# Patient Record
Sex: Female | Born: 1975 | Race: White | Hispanic: No | Smoking: Never smoker
Health system: Southern US, Community
[De-identification: ages and names within clinical notes are randomized; demographics above are authoritative.]

## PROBLEM LIST (undated history)

## (undated) DIAGNOSIS — F32A Depression, unspecified: Secondary | ICD-10-CM

## (undated) DIAGNOSIS — F329 Major depressive disorder, single episode, unspecified: Secondary | ICD-10-CM

## (undated) DIAGNOSIS — F314 Bipolar disorder, current episode depressed, severe, without psychotic features: Secondary | ICD-10-CM

## (undated) DIAGNOSIS — F319 Bipolar disorder, unspecified: Secondary | ICD-10-CM

## (undated) DIAGNOSIS — F419 Anxiety disorder, unspecified: Secondary | ICD-10-CM

## (undated) HISTORY — DX: Bipolar disorder, unspecified: F31.9

## (undated) HISTORY — PX: DILATION AND CURETTAGE OF UTERUS: SHX78

## (undated) HISTORY — PX: BREAST BIOPSY: SHX20

---

## 2002-02-11 ENCOUNTER — Other Ambulatory Visit: Admission: RE | Admit: 2002-02-11 | Discharge: 2002-02-11 | Payer: Self-pay | Admitting: *Deleted

## 2002-06-14 ENCOUNTER — Encounter: Payer: Self-pay | Admitting: *Deleted

## 2002-06-14 ENCOUNTER — Ambulatory Visit (HOSPITAL_COMMUNITY): Admission: RE | Admit: 2002-06-14 | Discharge: 2002-06-14 | Payer: Self-pay | Admitting: *Deleted

## 2002-06-25 ENCOUNTER — Inpatient Hospital Stay (HOSPITAL_COMMUNITY): Admission: AD | Admit: 2002-06-25 | Discharge: 2002-06-27 | Payer: Self-pay | Admitting: *Deleted

## 2002-09-03 ENCOUNTER — Encounter: Admission: RE | Admit: 2002-09-03 | Discharge: 2002-09-03 | Payer: Self-pay | Admitting: Family Medicine

## 2006-08-01 DIAGNOSIS — F314 Bipolar disorder, current episode depressed, severe, without psychotic features: Secondary | ICD-10-CM

## 2006-08-01 HISTORY — DX: Bipolar disorder, current episode depressed, severe, without psychotic features: F31.4

## 2007-04-08 ENCOUNTER — Emergency Department: Payer: Self-pay | Admitting: Emergency Medicine

## 2011-07-13 ENCOUNTER — Inpatient Hospital Stay: Payer: Self-pay | Admitting: Psychiatry

## 2015-09-09 ENCOUNTER — Other Ambulatory Visit: Payer: Self-pay | Admitting: Obstetrics and Gynecology

## 2015-09-09 DIAGNOSIS — Z1231 Encounter for screening mammogram for malignant neoplasm of breast: Secondary | ICD-10-CM

## 2015-09-22 ENCOUNTER — Ambulatory Visit
Admission: RE | Admit: 2015-09-22 | Discharge: 2015-09-22 | Disposition: A | Discharge status: Home / Self Care | Payer: No Typology Code available for payment source | Source: Ambulatory Visit | Attending: Obstetrics and Gynecology | Admitting: Obstetrics and Gynecology

## 2015-09-22 DIAGNOSIS — Z1231 Encounter for screening mammogram for malignant neoplasm of breast: Secondary | ICD-10-CM

## 2015-09-25 ENCOUNTER — Other Ambulatory Visit: Payer: Self-pay | Admitting: Obstetrics and Gynecology

## 2015-09-25 DIAGNOSIS — R928 Other abnormal and inconclusive findings on diagnostic imaging of breast: Secondary | ICD-10-CM

## 2015-10-08 ENCOUNTER — Encounter (HOSPITAL_COMMUNITY): Payer: Self-pay

## 2015-10-08 ENCOUNTER — Ambulatory Visit (HOSPITAL_COMMUNITY)
Admission: RE | Admit: 2015-10-08 | Discharge: 2015-10-08 | Disposition: A | Discharge status: Home / Self Care | Payer: No Typology Code available for payment source | Source: Ambulatory Visit | Attending: Obstetrics and Gynecology | Admitting: Obstetrics and Gynecology

## 2015-10-08 VITALS — BP 114/80 | Ht 69.0 in | Wt 343.0 lb

## 2015-10-08 DIAGNOSIS — Z1239 Encounter for other screening for malignant neoplasm of breast: Secondary | ICD-10-CM

## 2015-10-08 HISTORY — DX: Anxiety disorder, unspecified: F41.9

## 2015-10-08 HISTORY — DX: Depression, unspecified: F32.A

## 2015-10-08 HISTORY — DX: Bipolar disorder, current episode depressed, severe, without psychotic features: F31.4

## 2015-10-08 HISTORY — DX: Major depressive disorder, single episode, unspecified: F32.9

## 2015-10-08 NOTE — Patient Instructions (Signed)
Educational materials on self breast awareness given. Explained to Maria Jacobson that she did not need a Pap smear today due to last Pap smear was in February 2017 per patient. Let her know [facility] will cover Pap smears every 3 years unless has a history of abnormal Pap smears. Referred patient to the [facility] for right breast diagnostic mammogram per recommendation. Appointment scheduled for Friday, October 09, 2015 at 0950. Patient aware of appointment and will be there. Odette Frink verbalized understanding.  Jobeth Pangilinan, Arvil [place], RN 9:55 AM

## 2015-10-08 NOTE — Progress Notes (Signed)
Patient referred to [facility] by the [place] due to recommending additional imaging of the right breast. Screening mammogram completed 09/22/2015 at the [facility].  Pap Smear: Pap smear not completed today. Last Pap smear was in February 2017 at [facility] and normal per patient. Per patient has a history of an abnormal Pap smear when she was 40 years old that a follow up Pap smear was completed. Per patient all Pap smears have been normal since the one abnormal Pap smear over 20 years ago. No Pap smear results in EPIC.  Physical exam: Breasts Right breast larger than the left that per patient hasn't noticed any changes. Bilateral redness and yeast under bilateral lower breasts. No nipple retraction bilateral breasts. No nipple discharge bilateral breasts. No lymphadenopathy. No lumps palpated bilateral breasts. No complaints of pain or tenderness on exam. Referred patient to the [facility] for right breast diagnostic mammogram per recommendation. Appointment scheduled for Friday, October 09, 2015 at 0950.  Pelvic/Bimanual No Pap smear completed today since last Pap smear was in February 2017 per patient. Pap smear not indicated per BCCCP guidelines.   Smoking History: Patient has never smoked.  Patient Navigation: Patient education provided. Access to services provided for patient through [facility] program.

## 2015-10-09 ENCOUNTER — Ambulatory Visit
Admission: RE | Admit: 2015-10-09 | Discharge: 2015-10-09 | Disposition: A | Discharge status: Home / Self Care | Payer: No Typology Code available for payment source | Source: Ambulatory Visit | Attending: Obstetrics and Gynecology | Admitting: Obstetrics and Gynecology

## 2015-10-09 ENCOUNTER — Encounter (HOSPITAL_COMMUNITY): Payer: Self-pay | Admitting: *Deleted

## 2015-10-09 DIAGNOSIS — R928 Other abnormal and inconclusive findings on diagnostic imaging of breast: Secondary | ICD-10-CM

## 2015-11-12 ENCOUNTER — Ambulatory Visit (HOSPITAL_BASED_OUTPATIENT_CLINIC_OR_DEPARTMENT_OTHER): Payer: Self-pay

## 2015-11-12 ENCOUNTER — Other Ambulatory Visit: Payer: Self-pay

## 2015-11-12 VITALS — BP 113/71 | HR 78 | Temp 97.7°F | Resp 18 | Ht 68.0 in | Wt 341.2 lb

## 2015-11-12 DIAGNOSIS — Z0000 Encounter for general adult medical examination without abnormal findings: Secondary | ICD-10-CM

## 2015-11-12 LAB — LIPID PANEL
CHOL/HDL RATIO: 4.5 ratio — AB (ref 0.0–4.4)
Cholesterol, Total: 196 mg/dL (ref 100–199)
HDL: 44 mg/dL (ref >39)
LDL Calculated: 125 mg/dL — ABNORMAL HIGH (ref 0–99)
Triglycerides: 133 mg/dL (ref 0–149)
VLDL CHOLESTEROL CAL: 27 mg/dL (ref 5–40)

## 2015-11-12 LAB — GLUCOSE (CC13): GLUCOSE: 97 mg/dL (ref 70–140)

## 2015-11-12 NOTE — Progress Notes (Signed)
Patient is a new patient to the [facility] program and is currently a B[facility] patient effective 10/08/2015.   Clinical Measurements: Patient is 5 ft. 8 inches, weight 341.2 lbs, and BMI 52.   Medical History: Patient has no history of high cholesterol. Patient does not have a history of hypertension or diabetes. Per patient no diagnosed history of coronary heart disease, heart attack, heart failure, stroke/TIA, vascular disease or congenital heart defects. Patient does have a history of depression, panic attacks and bipolar affective disorder. She is on mulltiple medications which include: Adderall, Rexulti, klokopin, lamictal, seroquel, and Topamax.  Blood Pressure, Self-measurement: Patient states has no reason to check Blood pressure.  Nutrition Assessment: Patient stated that does not eat fruits every day. Patient states she eats 3 to 4 servings of vegetables a day. Per patient does not eat 3 or more ounces of whole grains daily. Patient doesn't eat two or more servings of fish weekly. Patient states she does not drink more than 36 ounces or 450 calories of beverages with added sugars weekly. Patient states she drinks a lot of water. Patient stated she does watch her salt intake.   Physical Activity Assessment: Patient stated she is not able to do much at this time so is not doing any moderate or vigorous activity.  Smoking Status: Patient stated that has never smoked and is not exposed to smoke.  Quality of Life Assessment: In assessing patient's physical quality of life she stated that out of the past 30 days that she has felt her health was not good for all days. Patient also stated that in the past 30 days that her mental health was not good including stress, depression and problems with emotions for all days. Patient did state that out of the past 30 days she felt her physical or mental health had kept her from doing her usual activities including self-care, work or recreation for 25 days.    Plan: Lab work will be done today including a lipid panel and Hgb A1C. Will call lab results when they are finished. Patient will receive Health Coaching for risk reduction initially and then as patient wants.

## 2015-11-12 NOTE — Patient Instructions (Signed)
Discussed health assessment with patient.She will be called with results of lab work and we will then discussed any further follow up the patient needs. Patient verbalized understanding.

## 2015-11-13 ENCOUNTER — Telehealth: Payer: Self-pay

## 2015-11-13 LAB — HEMOGLOBIN A1C
Est. average glucose Bld gHb Est-mCnc: 108 mg/dL
Hemoglobin A1c: 5.4 % (ref 4.8–5.6)

## 2015-11-13 NOTE — Telephone Encounter (Signed)
Called both telephone numbers and mail boxes were full. Will try again later

## 2015-11-13 NOTE — Telephone Encounter (Signed)
LAB RESULTS : Called to inform about lab work from 11/12/15. I informed patient: BMI 52, cholesterol- 196, HDL- 44, LDL- 125, triglycerides - 133, Bld Glucose -97 and HBG-A1C - 5.4.   RISK REDUCTION  HEALTH COACHING: Did risk reduction counseling concerning BMI. Informed that normal BMI was 18 to 25 and patient is in overweight range. Discussed coming in to develop plan to lower BMI. Discussed that needed to increase exercise and increase fiber to lower LDL. Fiber like wheat bread, fresh fruits and vegetables. Patient will call back to set up appointment for nutrition and exercise to obtain a health weight.

## 2015-12-02 ENCOUNTER — Telehealth: Payer: Self-pay

## 2015-12-02 NOTE — Telephone Encounter (Signed)
Called and no voice mail to leave message. Will call back for health coaching later.

## 2016-01-25 ENCOUNTER — Telehealth: Payer: Self-pay

## 2016-01-25 NOTE — Telephone Encounter (Signed)
Called for health coaching and was no answer and no answering machine.

## 2016-01-25 NOTE — Telephone Encounter (Signed)
Attempted to call patients significant other and voice mail was not set up.

## 2016-03-04 ENCOUNTER — Other Ambulatory Visit: Payer: Self-pay | Admitting: Internal Medicine

## 2016-03-04 ENCOUNTER — Other Ambulatory Visit: Payer: Self-pay | Admitting: Obstetrics and Gynecology

## 2016-03-04 DIAGNOSIS — N6310 Unspecified lump in the right breast, unspecified quadrant: Secondary | ICD-10-CM

## 2016-04-19 ENCOUNTER — Other Ambulatory Visit: Payer: No Typology Code available for payment source

## 2016-04-26 ENCOUNTER — Ambulatory Visit
Admission: RE | Admit: 2016-04-26 | Discharge: 2016-04-26 | Disposition: A | Discharge status: Home / Self Care | Payer: No Typology Code available for payment source | Source: Ambulatory Visit | Attending: Obstetrics and Gynecology | Admitting: Obstetrics and Gynecology

## 2016-04-26 ENCOUNTER — Other Ambulatory Visit: Payer: Self-pay | Admitting: Obstetrics and Gynecology

## 2016-04-26 DIAGNOSIS — N6310 Unspecified lump in the right breast, unspecified quadrant: Secondary | ICD-10-CM

## 2016-05-02 ENCOUNTER — Other Ambulatory Visit: Payer: Self-pay | Admitting: Obstetrics and Gynecology

## 2016-05-02 DIAGNOSIS — N6310 Unspecified lump in the right breast, unspecified quadrant: Secondary | ICD-10-CM

## 2016-05-03 ENCOUNTER — Ambulatory Visit
Admission: RE | Admit: 2016-05-03 | Discharge: 2016-05-03 | Disposition: A | Discharge status: Home / Self Care | Payer: No Typology Code available for payment source | Source: Ambulatory Visit | Attending: Obstetrics and Gynecology | Admitting: Obstetrics and Gynecology

## 2016-05-03 DIAGNOSIS — N6310 Unspecified lump in the right breast, unspecified quadrant: Secondary | ICD-10-CM

## 2016-05-23 ENCOUNTER — Encounter: Payer: Self-pay | Admitting: Family Medicine

## 2016-05-23 ENCOUNTER — Ambulatory Visit (INDEPENDENT_AMBULATORY_CARE_PROVIDER_SITE_OTHER): Payer: Self-pay | Admitting: Family Medicine

## 2016-05-23 VITALS — BP 115/84 | HR 98 | Temp 98.3°F | Resp 16 | Ht 69.0 in | Wt 334.0 lb

## 2016-05-23 DIAGNOSIS — F419 Anxiety disorder, unspecified: Secondary | ICD-10-CM

## 2016-05-23 DIAGNOSIS — Z6841 Body Mass Index (BMI) 40.0 and over, adult: Secondary | ICD-10-CM

## 2016-05-23 DIAGNOSIS — E782 Mixed hyperlipidemia: Secondary | ICD-10-CM

## 2016-05-23 DIAGNOSIS — E785 Hyperlipidemia, unspecified: Secondary | ICD-10-CM | POA: Insufficient documentation

## 2016-05-23 DIAGNOSIS — F319 Bipolar disorder, unspecified: Secondary | ICD-10-CM

## 2016-05-23 DIAGNOSIS — F314 Bipolar disorder, current episode depressed, severe, without psychotic features: Secondary | ICD-10-CM

## 2016-05-23 DIAGNOSIS — M25562 Pain in left knee: Secondary | ICD-10-CM

## 2016-05-23 DIAGNOSIS — Z7689 Persons encountering health services in other specified circumstances: Secondary | ICD-10-CM

## 2016-05-23 MED ORDER — METHYLPREDNISOLONE ACETATE 40 MG/ML IJ SUSP
40.0000 mg | Freq: Once | INTRAMUSCULAR | Status: AC
  Administered 2016-05-23: 40 mg via INTRA_ARTICULAR

## 2016-05-23 MED ORDER — LIDOCAINE HCL (PF) 1 % IJ SOLN
4.0000 mL | Freq: Once | INTRAMUSCULAR | Status: AC
  Administered 2016-05-23: 4 mL

## 2016-05-23 NOTE — Patient Instructions (Signed)
Thank you for coming in to clinic today.  1. Left knee steroid injection today, take it easy for 1-2 days, elevate, ice and wrap for compression, possible steroid flare with inc pain in 24-48 hours, then should improve - I am concerned for torn meniscus, may have other ligament injury but difficult to determine today  - Referral - wait 1-2 weeks if you have not heard give them a call to follow-up [facility] ([facility] [address], [place], [place] [address] Phone: [phone]  - Take Aleve OTC 223m take 2 pills twice a day with food, max dose. Stop Ibuprofen. -Recommend to start taking Tylenol Extra Strength 5056mtabs - take 1 to 2 tabs (max 100074mer dose) every 6 hours for pain (take regularly, don't skip a dose for next 3-7 days), max 24 hour daily dose is 6 to 8 tablets or 3000 to 4000m25mThis is safe to take with Ibuprofen or Aleve or similar medications  Future we will follow-up for annual physical for 2018 once you have your screening labs etc, bring copies of all records  Please schedule a follow-up appointment with Dr. KaraParks Ranger6 weeks for knee pain follow-up as needed, otherwise may follow-up with Ortho and return to our office in Feb or March 2018  If you have any other questions or concerns, please feel free to call the clinic or send a message through [facility] may also schedule an earlier appointment if necessary.  AlexNobie Putnam [place]

## 2016-05-23 NOTE — Progress Notes (Signed)
Subjective:    Patient ID: Maria Jacobson, female    DOB: 1975/12/11, 40 y.o.   MRN: [id number]  Maria Jacobson is a 40 y.o. female presenting on 05/23/2016 for Establish Care (main concerned L knee swollen and painful onset year but progressively getting worst)  [facility] Screening / Previously at [facility], last visit >1-2 years.  HPI   LEFT KNEE, CHRONIC PAIN: - Reports chronic problem with gradual worsening over past 1.5 years, describes a traumatic onset since 03/2015, acutely during activity, felt discomfort possibly a "pop", next 1-2 days significant worsening with swelling and acute pain, without bruising, was bedbound for period of time to help heal, had difficulty recovering. Initially went to Urgent Care initially, thought maybe Burisitis, has had in elbow, treated with prednisone x 2, with some side effects but no overall resolution. - Today describes constant left knee pain, severe up to 9/10 at worst, avg 8/10, describes sharp pains on medial left knee when ambulatory and weight bearing, when sitting and resting with some aching, and pressure on lateral aspect. Has instability symptoms when ambulating feels like it "goes backwards" and feels "weak due to pain", occasional mechanical symptoms some "locking". Limited range of motion worse with flexion. Associated swelling worse with activity, some improve with ice, compression sleeve and elevation. - Worse with prolonged standing, can stand only for few minutes, difficulty leaving bed only to go to bathroom, swelling, worsening ankle swelling. Worse with long car ride both of legs below knee swell - Recently saw chiropractor [place] therapy on knee with mild improvement - Taking Aleve 247m x 3 pills for BID and Ibuprofen 2067mx 4 tabs, no Tylenol, has been on OTC NSAIDs for a while - H/o BUs accident 1998 with chronic back pain, previously on vicodin for several years, has been off for about 8 years  Bipolar I / ADD /  Chronic Anxiety - [facility]- Today reports doing well on current medication regimen, no new concerns at this time - Medication Regimen:   - Taking Clonazepam 0.35m53mive times daily for up to 3 years, more recently Xanax 1mg19mghtly 4-5x nightly out of week (1-2 years)   - Taking Adderall 30mg46mly, well controlled, stable, since age 1   68Recently started Abilify IM inj (monthly), started few days ago, will taper off of Rexulti 3mg b38m1/2   - Taking Saphris 35mg, s91mingual 24 hr - for sleep, taking over 8 months   - Taking Lamictal 100mg BI18mr past 7 years, tolerated well without any symptoms of rash   - Seroquel 50mg BID61mr past 6 years - Reports she plans to file for disability for psychiatric diagnoses  HYPERLIPIDEMIA / MORBID OBESITY: - Last lipid panel done 10/2015 with mild abnormal LDL, otherwise normal results. She has never been treated with statin before. Admits to recent worsening weight gain due to chronic knee pain and inactivity. She gets cholesterol checked routinely through [facility] woman program, yearly will bring us resultKorea- Reports no significant abnormal glucose testing, does not recall prior A1c, does not have results from last screening - Previously was on a meal delivery restricted plan, [employer], did well with this, but had to stop 2 weeks ago due to cost, will try again in future, was able to lose up to 20 lbs over 3 months on the diet plan  Health Maintenance: - Follows with [facility]CCP free screening program for  mammograms and pap smears, History of R-breast complicated cyst recently, biopsy, negative - Has Medicaid Family Planning, follows at [facility] in [place] for routine pap smears, Denies abnormal pap smear, last February 2017, next due 2018. - Due for Flu (will go to pharmacy for discount self pay and bring [place] document) / Due for TDdap will hold for now   Past Medical History:    Diagnosis Date  . Anxiety    pt is seen by psych  . Bipolar 1 disorder ([place])   . Bipolar affective disorder, depressed, severe ([place])   . Depression    Social History   Social History  . Marital status: Legally Separated    Spouse name: N/A  . Number of children: N/A  . Years of education: N/A   Occupational History  . Not on file.   Social History Main Topics  . Smoking status: Never Smoker  . Smokeless tobacco: Never Used  . Alcohol use Yes     Comment: rarely  . Drug use: No  . Sexual activity: Yes    Birth control/ protection: IUD   Other Topics Concern  . Not on file   Social History Narrative  . No narrative on file   Family History  Problem Relation Age of Onset  . Thyroid disease Mother   . Rheum arthritis Mother   . Gout Mother   . Kidney disease Mother   . Bipolar disorder Father    Current Outpatient Prescriptions on File Prior to Visit  Medication Sig  . ALPRAZolam (XANAX) 1 MG tablet Take 1 mg by mouth at bedtime as needed for anxiety.  Marland Kitchen amphetamine-dextroamphetamine (ADDERALL) 30 MG tablet Take 30 mg by mouth daily.  . Brexpiprazole (REXULTI) 3 MG TABS Take by mouth daily.  . clonazePAM (KLONOPIN) 0.5 MG tablet Take 0.5 mg by mouth 5 (five) times daily.  Marland Kitchen lamoTRIgine (LAMICTAL) 100 MG tablet Take 100 mg by mouth 2 (two) times daily.  . QUEtiapine (SEROQUEL) 50 MG tablet Take 50 mg by mouth 2 (two) times daily.   No current facility-administered medications on file prior to visit.     Review of Systems  Constitutional: Negative for activity change, appetite change, chills, diaphoresis, fatigue and fever.  HENT: Negative for congestion, hearing loss and sinus pressure.   Eyes: Negative for visual disturbance.  Respiratory: Negative for cough, chest tightness, shortness of breath and wheezing.   Cardiovascular: Negative for chest pain, palpitations and leg swelling.  Gastrointestinal: Negative for abdominal pain, constipation, diarrhea, nausea  and vomiting.  Endocrine: Negative for cold intolerance, polydipsia and polyuria.  Genitourinary: Negative for dysuria, frequency and hematuria.  Musculoskeletal: Positive for arthralgias (left knee), back pain, gait problem and joint swelling. Negative for myalgias and neck pain.  Skin: Negative for rash.  Allergic/Immunologic: Negative for environmental allergies.  Neurological: Negative for dizziness, weakness, light-headedness, numbness and headaches.  Hematological: Negative for adenopathy.  Psychiatric/Behavioral: Positive for sleep disturbance (due to pain). Negative for agitation, behavioral problems, decreased concentration, dysphoric mood, self-injury and suicidal ideas. The patient is not nervous/anxious (controlled).    Per HPI unless specifically indicated above    Objective:    BP 115/84   Pulse 98   Temp 98.3 F (36.8 C) (Oral)   Resp 16   Ht 5' 9"  (1.753 m)   Wt (!) 334 lb (151.5 kg)   BMI 49.32 kg/m   Wt Readings from Last 3 Encounters:  05/23/16 (!) 334 lb (151.5 kg)  11/12/15 (!) 341 lb  3.2 oz (154.8 kg)  10/08/15 (!) 343 lb (155.6 kg)    Physical Exam  Constitutional: She appears well-developed and well-nourished. No distress.  Well-appearing, comfortable, cooperative, obese  HENT:  Head: Normocephalic and atraumatic.  Mouth/Throat: Oropharynx is clear and moist.  Eyes: Conjunctivae and EOM are normal. Pupils are equal, round, and reactive to light.  Neck: Normal range of motion. Neck supple. No thyromegaly present.  Cardiovascular: Normal rate, regular rhythm, normal heart sounds and intact distal pulses.   No murmur heard. Pulmonary/Chest: Effort normal and breath sounds normal. No respiratory distress. She has no wheezes. She has no rales.  Abdominal: Soft. Bowel sounds are normal. She exhibits no distension.  Musculoskeletal:  Left Knee Inspection: Bilateral significant soft tissue bulkiness bilaterally due to large body habitus. Left knee with  increased medial soft tissue swelling, without discrete knee joint effusion. No ecchymosis or erythema. Palpation: Generalized +TTP bilateral knees medial joint space, Left knee increased medial tenderness. L increased crepitus compared to R. ROM: Full active bilateral knee extension, left knee limited flexion due to body habitus and pain. Special Testing: Limited testing on L knee due to pain with ligamentous testing, grossly intact ACL with Lachman. Pain on MCL manipulation, however most consistent with Media Meniscus with positive Thessaly maneuver on left knee medial. McMurray not performed due to pain. Strength: 5/5 intact knee flex/ext, ankle dorsi/plantarflex Neurovascular: distally intact sensation light touch and pulses  Low Back Inspection: Large body habitus, no spinal deformity, symmetrical. Palpation: No tenderness over spinous processes. Mild bilateral lower lumbar paraspinal muscles tender and without hypertonicity/spasm. ROM: Some limited back extension and flexion. Special Testing: Seated SLR negative for radicular pain bilaterally Strength: Bilateral hip flex/ext 5/5, knee flex/ext 5/5, ankle dorsiflex/plantarflex 5/5 Neurovascular: intact distal sensation to light touch   Lymphadenopathy:    She has no cervical adenopathy.  Neurological: She is alert.  Skin: Skin is warm and dry. No rash noted. She is not diaphoretic.  Psychiatric: She has a normal mood and affect. Her behavior is normal.  Nursing note and vitals reviewed.   ________________________________________________________ PROCEDURE NOTE Date: 05/23/16 Left Knee Steroid injection Discussed benefits and risks (including pain, bleeding, infection, steroid flare). Verbal consent given by patient. Medication:  1 cc Depo-medrol 17m and 4 cc Lidocaine 1% without epi Time Out taken  Landmarks identified. Area cleansed with alcohol wipes.Using 21 gauge and 1, 1/2 inch needle, Left knee joint was injected (with above  listed medication) via medial approach. Cold spray used for superficial anesthetic.Sterile bandage placed.Patient tolerated procedure well without bleeding or paresthesias.No complications.  I have personally reviewed the following lab results from 11/12/15.  Results for orders placed or performed in visit on 11/12/15  Hemoglobin A1c  Result Value Ref Range   Hemoglobin A1c 5.4 4.8 - 5.6 %   Est. average glucose Bld gHb Est-mCnc 108 mg/dL  Lipid panel  Result Value Ref Range   Cholesterol, Total 196 100 - 199 mg/dL   Triglycerides 133 0 - 149 mg/dL   HDL 44 >39 mg/dL   VLDL Cholesterol Cal 27 5 - 40 mg/dL   LDL Calculated 125 (H) 0 - 99 mg/dL   Chol/HDL Ratio 4.5 (H) 0.0 - 4.4 ratio units  Glucose  Result Value Ref Range   Glucose 97 70 - 140 mg/dl      Assessment & Plan:   Problem List Items Addressed This Visit    Morbid obesity with BMI of 45.0-49.9, adult (HCC)    Worsening wt gain, chronic  problem. No significant fam history risk factors of DM, HTN. Improved wt loss on restricted meal diet plan, now worsening off of this due to cost. Limited exercise due to chronic left knee pain - Lifestyle modification counseling for wt loss today - Treat underlying L knee pain - Resume reduced portion dietary meal plan when able      Left medial knee pain    Worsening chronic Left medial and generalized knee pain with recurrent swelling, instability and mechanical symptoms. Concern for left medial meniscal injury, additionally may have other ligamentous injury MCL, but difficult exam today. Acute injury onset 03/2015 without adequate treatment, worsening pain since. Now limited function due to knee. Complicated by factor of morbid obesity. - No prior imaging, injections or other treatments  Plan: 1. Given acute pain, offered Left knee joint steroid injection today - see procedure note for details, tolerated well, ideally will provide mild-mod pain relief for few days to weeks until can  establish with ortho 2. Referral to [facility] for further evaluation, imaging, and management, will likely need x-rays (since patient is self pay, deferred these today), concern patient may need advanced imaging with MRI vs arthroscopy 3. Continue RICE therapy, avoid re-injury, may take Tylenol regularly, NSAID PRN 4. Follow-up as needed after ortho      Relevant Medications   lidocaine (PF) (XYLOCAINE) 1 % injection 4 mL (Completed)   methylPREDNISolone acetate (DEPO-MEDROL) injection 40 mg (Completed)   Other Relevant Orders   Ambulatory referral to Orthopedic Surgery   Hyperlipidemia    Last lipid 10/2015, only mild elevated LDL, otherwise stable. But weight gain. - Follow-up routine yearly fasting lipids through [facility] - No change today - Counseled on weight loss, treat left knee to improve mobility      Bipolar affective disorder, depressed, severe (HCC)    Stable, chronic problem Followed by Psychiatry Rondall Allegra Controlled on current med regimen per Psychiatry, no med management from our office on these and related problems. Patient considering filing for disability due to mental health      Bipolar 1 disorder ([place])   Anxiety    Stable, currently controlled on regimen per Psychiatry       Other Visit Diagnoses    Encounter to establish care with new doctor    -  Primary      Meds ordered this encounter  Medications  .       .       . lidocaine (PF) (XYLOCAINE) 1 % injection 4 mL  . methylPREDNISolone acetate (DEPO-MEDROL) injection 40 mg    Follow up plan: Return in about 6 weeks (around 07/04/2016) for left knee pain.   [name], [facility] 05/24/2016, 10:17 AM

## 2016-05-24 ENCOUNTER — Telehealth: Payer: Self-pay | Admitting: Family Medicine

## 2016-05-24 DIAGNOSIS — G8929 Other chronic pain: Secondary | ICD-10-CM | POA: Insufficient documentation

## 2016-05-24 DIAGNOSIS — M5442 Lumbago with sciatica, left side: Principal | ICD-10-CM

## 2016-05-24 DIAGNOSIS — M5441 Lumbago with sciatica, right side: Principal | ICD-10-CM

## 2016-05-24 MED ORDER — PREDNISONE 20 MG PO TABS: ORAL_TABLET | ORAL | 0 refills | Status: DC

## 2016-05-24 NOTE — Telephone Encounter (Signed)
Called patient back to discuss and clarify about prednisone. Just established care with me yesterday 10/23, see note for details, additionally with known history of chronic low back pain with bilateral sciatica, did not discuss in full detail yesterday due to acuity of knee complaint and establishing care. She was treated in past with prednisone bursts with relief, now she decided to do trial for back as well following her knee injection. Sent in prednisone taper 7 days to pharmacy.  Follow-up as planned.  [name], [facility] 05/24/2016, 2:37 PM]

## 2016-05-24 NOTE — Telephone Encounter (Signed)
Pt changed her mind about the prednisone for her back.  Please send prescription to [facility] [address].  Her call back number is [phone]

## 2016-05-24 NOTE — Assessment & Plan Note (Signed)
Worsening chronic Left medial and generalized knee pain with recurrent swelling, instability and mechanical symptoms. Concern for left medial meniscal injury, additionally may have other ligamentous injury MCL, but difficult exam today. Acute injury onset 03/2015 without adequate treatment, worsening pain since. Now limited function due to knee. Complicated by factor of morbid obesity. - No prior imaging, injections or other treatments  Plan: 1. Given acute pain, offered Left knee joint steroid injection today - see procedure note for details, tolerated well, ideally will provide mild-mod pain relief for few days to weeks until can establish with ortho 2. Referral to [facility] for further evaluation, imaging, and management, will likely need x-rays (since patient is self pay, deferred these today), concern patient may need advanced imaging with MRI vs arthroscopy 3. Continue RICE therapy, avoid re-injury, may take Tylenol regularly, NSAID PRN 4. Follow-up as needed after ortho

## 2016-05-24 NOTE — Assessment & Plan Note (Addendum)
Stable, chronic problem Followed by Psychiatry Maria Jacobson Controlled on current med regimen per Psychiatry, no med management from our office on these and related problems. Patient considering filing for disability due to mental health

## 2016-05-24 NOTE — Assessment & Plan Note (Signed)
Stable, currently controlled on regimen per Psychiatry

## 2016-05-24 NOTE — Assessment & Plan Note (Signed)
Worsening wt gain, chronic problem. No significant fam history risk factors of DM, HTN. Improved wt loss on restricted meal diet plan, now worsening off of this due to cost. Limited exercise due to chronic left knee pain - Lifestyle modification counseling for wt loss today - Treat underlying L knee pain - Resume reduced portion dietary meal plan when able

## 2016-05-24 NOTE — Assessment & Plan Note (Signed)
Last lipid 10/2015, only mild elevated LDL, otherwise stable. But weight gain. - Follow-up routine yearly fasting lipids through [facility] - No change today - Counseled on weight loss, treat left knee to improve mobility

## 2016-06-01 ENCOUNTER — Telehealth: Payer: Self-pay | Admitting: Family Medicine

## 2016-06-01 NOTE — Telephone Encounter (Signed)
Patient does not have insurance. She can apply through [facility]. I do have application if she would like to pick up. [facility] will not see patient. Dr. Raliegh Ip feels she does need to be seen by ortho and [facility] is only place that will see.

## 2016-06-15 ENCOUNTER — Encounter: Payer: Self-pay | Admitting: Family Medicine

## 2016-06-15 ENCOUNTER — Ambulatory Visit
Admission: RE | Admit: 2016-06-15 | Discharge: 2016-06-15 | Disposition: A | Discharge status: Home / Self Care | Payer: Self-pay | Source: Ambulatory Visit | Attending: Family Medicine | Admitting: Family Medicine

## 2016-06-15 ENCOUNTER — Ambulatory Visit (INDEPENDENT_AMBULATORY_CARE_PROVIDER_SITE_OTHER): Payer: Self-pay | Admitting: Family Medicine

## 2016-06-15 VITALS — BP 123/74 | HR 98 | Temp 98.3°F | Resp 16 | Ht 69.0 in | Wt 342.0 lb

## 2016-06-15 DIAGNOSIS — M7989 Other specified soft tissue disorders: Secondary | ICD-10-CM

## 2016-06-15 DIAGNOSIS — Z8261 Family history of arthritis: Secondary | ICD-10-CM

## 2016-06-15 DIAGNOSIS — M25562 Pain in left knee: Secondary | ICD-10-CM

## 2016-06-15 DIAGNOSIS — Z6841 Body Mass Index (BMI) 40.0 and over, adult: Secondary | ICD-10-CM

## 2016-06-15 DIAGNOSIS — M2550 Pain in unspecified joint: Secondary | ICD-10-CM

## 2016-06-15 DIAGNOSIS — R600 Localized edema: Secondary | ICD-10-CM | POA: Insufficient documentation

## 2016-06-15 DIAGNOSIS — M79662 Pain in left lower leg: Secondary | ICD-10-CM | POA: Insufficient documentation

## 2016-06-15 MED ORDER — FUROSEMIDE 20 MG PO TABS: ORAL_TABLET | ORAL | 0 refills | Status: DC

## 2016-06-15 NOTE — Assessment & Plan Note (Signed)
Check RA screening labs, review at next visit

## 2016-06-15 NOTE — Patient Instructions (Signed)
Thank you for coming in to clinic today.  1. For swelling, we will order STAT lower extremity ultrasound to eval for blood clot - We will contact you with results as soon as we get them - Keep elevation, compression, ice - Take Lasix 10m daily as needed for swelling for next few days, if needed you can increase to 2 pills per dose or 1 pill twice a day for few days, total about 5-7 days then stop  You will be due for NON FASTING BLOOD WORK - Anytime in next 1 week  For Lab Results, once available within 2-3 days of blood draw, you can can log in to MyChart online to view your results and a brief explanation. Also, we can discuss results at next follow-up visit.   Follow-up as scheduled in December 2012 for left knee pain, discuss lab results, leg swelling  If you have any other questions or concerns, please feel free to call the clinic or send a message through [facility] You may also schedule an earlier appointment if necessary.  ANobie Putnam DO [facility]

## 2016-06-15 NOTE — Assessment & Plan Note (Signed)
Persistent pain, suspected affecting her lower extremity edema worsening Follow-up as scheduled in 1 month, awaiting charity care application for [facility]

## 2016-06-15 NOTE — Assessment & Plan Note (Signed)
Likely contributing to acute on chronic lower ext edema

## 2016-06-15 NOTE — Assessment & Plan Note (Signed)
Concern for DVT given risk with recent prolonged travel and relative immobility with knee pain (Well's score 3-4, high risk), no personal history of clot. - No concern for PE at this time. Suspect d-dimer will be falsely elevated in morbid obese patient  Plan: 1. STAT Venous duplex bilateral LE ordered - reviewed results this afternoon with no evidence of DVT 2. Check chemistry, to see Cr baseline, electrolytes, LFTs 3. Brief trial on Lasix for acute symptomatic relief, advised not long-term plan 4. Continue elevation, ice, compression 5. Follow-up within 1 month

## 2016-06-15 NOTE — Assessment & Plan Note (Signed)
Suspected underlying arthritis, given family history of RA and concern with bilateral hand joint pain and swelling episodes, will check RA screening with ESR, CRP, RF, anti-CCP - review results at next visit
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
## 2016-06-15 NOTE — Assessment & Plan Note (Signed)
Suspected multifactorial etiology including chronic venous stasis in setting of morbid obesity, worse on Left with persistent chronic L knee pain limiting mobility - Concern with inc risk DVT, will check venous US STAT - Check chemistry to eval kidney / liver function, no associated symptoms suggestive of CHF or cardiac etiology for edema - Trial on lasix temporarily, continue conservative measures - Follow-up

## 2016-06-21 ENCOUNTER — Other Ambulatory Visit: Payer: Medicaid Other

## 2016-07-06 ENCOUNTER — Other Ambulatory Visit: Payer: Self-pay

## 2016-07-06 ENCOUNTER — Other Ambulatory Visit: Payer: Self-pay | Admitting: Family Medicine

## 2016-07-06 LAB — COMPLETE METABOLIC PANEL WITH GFR
ALBUMIN: 4 g/dL (ref 3.6–5.1)
ALK PHOS: 65 U/L (ref 33–115)
ALT: 20 U/L (ref 6–29)
AST: 14 U/L (ref 10–30)
BILIRUBIN TOTAL: 0.4 mg/dL (ref 0.2–1.2)
BUN: 13 mg/dL (ref 7–25)
CALCIUM: 9 mg/dL (ref 8.6–10.2)
CO2: 25 mmol/L (ref 20–31)
CREATININE: 0.9 mg/dL (ref 0.50–1.10)
Chloride: 101 mmol/L (ref 98–110)
GFR, Est African American: >89 mL/min (ref >=60)
GFR, Est Non African American: 79 mL/min (ref >=60)
GLUCOSE: 89 mg/dL (ref 65–99)
POTASSIUM: 4.6 mmol/L (ref 3.5–5.3)
SODIUM: 138 mmol/L (ref 135–146)
TOTAL PROTEIN: 6.7 g/dL (ref 6.1–8.1)

## 2016-07-06 LAB — TSH: TSH: 0.58 mIU/L

## 2016-07-07 LAB — CYCLIC CITRUL PEPTIDE ANTIBODY, IGG: Cyclic Citrullin Peptide Ab: <16 Units

## 2016-07-07 LAB — C-REACTIVE PROTEIN: CRP: 6.2 mg/L (ref <8.0)

## 2016-07-07 LAB — RHEUMATOID FACTOR

## 2016-07-07 LAB — VITAMIN D 25 HYDROXY (VIT D DEFICIENCY, FRACTURES): Vit D, 25-Hydroxy: 33 ng/mL (ref 30–100)

## 2016-07-07 LAB — SEDIMENTATION RATE: SED RATE: 11 mm/h (ref 0–20)

## 2016-07-12 ENCOUNTER — Ambulatory Visit (INDEPENDENT_AMBULATORY_CARE_PROVIDER_SITE_OTHER): Payer: Self-pay | Admitting: Family Medicine

## 2016-07-12 ENCOUNTER — Encounter: Payer: Self-pay | Admitting: Family Medicine

## 2016-07-12 VITALS — BP 119/77 | HR 104 | Temp 97.6°F | Resp 16 | Ht 69.0 in | Wt 340.0 lb

## 2016-07-12 DIAGNOSIS — G8929 Other chronic pain: Secondary | ICD-10-CM

## 2016-07-12 DIAGNOSIS — M5441 Lumbago with sciatica, right side: Secondary | ICD-10-CM

## 2016-07-12 DIAGNOSIS — Z6841 Body Mass Index (BMI) 40.0 and over, adult: Secondary | ICD-10-CM

## 2016-07-12 DIAGNOSIS — M25562 Pain in left knee: Secondary | ICD-10-CM

## 2016-07-12 DIAGNOSIS — M79662 Pain in left lower leg: Secondary | ICD-10-CM

## 2016-07-12 DIAGNOSIS — M7989 Other specified soft tissue disorders: Secondary | ICD-10-CM

## 2016-07-12 MED ORDER — CYCLOBENZAPRINE HCL 10 MG PO TABS: 5.0000 mg | ORAL_TABLET | Freq: Three times a day (TID) | ORAL | 2 refills | Status: DC | PRN

## 2016-07-12 MED ORDER — GABAPENTIN 100 MG PO CAPS: ORAL_CAPSULE | ORAL | 1 refills | Status: DC

## 2016-07-12 MED ORDER — MELOXICAM 15 MG PO TABS: 15.0000 mg | ORAL_TABLET | Freq: Every day | ORAL | 2 refills | Status: DC
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
## 2016-07-12 NOTE — Assessment & Plan Note (Signed)
Improved, stable chronic L>R, complicated by limited mobility and L knee injury. Suspect some chronic venous stasis and immobility as primary causes Recent Doppler US negative for DVT 06/2016, labs unremarkable no other etiology of swelling (no kidney, heart, or liver injury) - Improved with 4 day course lasix 20 daily  Plan: 1. Continue RICE therapy as needed, ACE wrap, elevation 2. Stop Lasix, but may re-use PRN 75m daily for 3-5 days, can refill in future if needed, only if acute worsening 3. Follow-up as needed, treat underlying Knee and back pain

## 2016-07-12 NOTE — Assessment & Plan Note (Addendum)
Worsening chronic 1.5 yr+ Left medial and generalized knee pain with recurrent swelling, instability and mechanical symptoms. Still concern remains for possible left medial meniscal injury. Complicated by factor of morbid obesity. - No recent imaging of knee - S/p last steroid injection 05/23/16 with dramatic improvement x 4 weeks, still residual improvement  Plan: 1. Switch Aleve to meloxicam 67m daily 2. Continue RICE therapy, activity modification 3. Trial on Flexeril and Gabapentin for back, may help knee 4. Can repeat L knee steroid injection 3 months later, anytime after 08/23/16 5. [facility] (mailed app yesterday 07/11/16), will need further imaging and management 6. Follow-up 8 weeks repeat inj  Completed paperwork in office today for [employer] Handicap Placard, temporary 6 months, due to inability to walk 200 ft without resting and severely limited mobility due to orthopedic condition
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
## 2016-07-12 NOTE — Assessment & Plan Note (Signed)
Worsening wt gain, chronic problem. No significant fam history risk factors of DM, HTN. Improved wt loss on restricted meal diet plan, now worsening off of this due to cost. Limited exercise due to chronic left knee pain - Lifestyle modification counseling for wt loss today - Treat underlying L knee pain and Low back pain

## 2016-07-12 NOTE — Patient Instructions (Addendum)
Thank you for coming in to clinic today.  1. 1. For your Back Pain - I think that this is due to Muscle Spasms or strain. Your Sciatic Nerve can be affected causing some of your radiation and numbness down your legs. - STOP taking Aleve - Start with anti-inflammatory Meloxicam (mobic) 79m ONCE a day with food for next 4 weeks, then likely if helping can continue for several months. Do not take aleve, ibuprofen, naproxen on this medication - Continue Tylenol Ext Str 5050mtabs, x 2 per dose 100071mp to 3 times a day as needed for breakthrough pain 2. Start Cyclobenzapine (Flexeril) 47m63mblets - cut in half for 5mg 98mnight for muscle relaxant - may make you sedated or sleepy (be careful driving or working on this) if tolerated you can take every 8 hours, half or whole tab 3. Start Gabapentin 100mg 68mules, take at night for 2-3 nights only, and then increase to 2 times a day for a few days, and then may increase to 3 times a day, it may make you drowsy, if helps significantly at night only, then you can increase instead to 3 capsules at night, instead of 3 times a day - Slowly continue to increase dose up to 300mg 331mes a day, ultimately doses of 600-900mg 3 7ms a day may be needed, before switching to Lyrica  Continue topical treatments on low back  Request X-rays (images and reports from [facility] will probably need [facility])  Ultimately, if [facility] can get [facility] will need to do further work-up on back and knee, likely need MRI, may benefit from variety of back treatments, hopefully if medicines help control back pain, and knee gets resolved, then in future may do physical therapy, epidural injections or other options  This pain may take weeks to months to fully resolve, but hopefully it will respond to the medicine initially. All back injuries (small or serious) are slow to heal since we use our back muscles every day. Be  careful with turning, twisting, lifting, sitting / standing for prolonged periods, and avoid re-injury.  If your symptoms significantly worsen with more pain, or new symptoms with weakness in one or both legs, new or different shooting leg pains, numbness in legs or groin, loss of control or retention of urine or bowel movements, please call back for advice and you may need to go directly to the Emergency Department.  Please schedule a follow-up appointment with Dr. KaramaleParks Ranger 8 weeks follow-up Low Back Pain / Knee Pain, next steroid injection in L knee 3 months after 05/23/16 (after 08/23/16)  You can notify me by MyChart with medication update in 4 weeks if you do not want to come in  If you have any other questions or concerns, please feel free to call the clinic or send a message through MyChart.Maria Jacobson also schedule an earlier appointment if necessary.  Maria Jacobson

## 2016-08-19 ENCOUNTER — Encounter: Payer: Self-pay | Admitting: Family Medicine

## 2016-08-19 DIAGNOSIS — M5441 Lumbago with sciatica, right side: Principal | ICD-10-CM

## 2016-08-19 DIAGNOSIS — G8929 Other chronic pain: Secondary | ICD-10-CM

## 2016-08-19 MED ORDER — CYCLOBENZAPRINE HCL 10 MG PO TABS: 10.0000 mg | ORAL_TABLET | Freq: Three times a day (TID) | ORAL | 2 refills | Status: DC | PRN

## 2016-08-19 MED ORDER — GABAPENTIN 300 MG PO CAPS: 300.0000 mg | ORAL_CAPSULE | Freq: Three times a day (TID) | ORAL | 2 refills | Status: DC

## 2016-08-19 MED ORDER — GABAPENTIN 100 MG PO CAPS: 100.0000 mg | ORAL_CAPSULE | Freq: Three times a day (TID) | ORAL | 2 refills | Status: DC

## 2016-08-19 NOTE — Telephone Encounter (Signed)
Already responded to this in previous [employer]. Refills for Flexeril and Gabapentin sent to pharmacy, patient notified by phone.  [name], [facility] 08/19/2016, 4:33 PM

## 2016-09-28 ENCOUNTER — Other Ambulatory Visit: Payer: Self-pay | Admitting: Family Medicine

## 2016-09-28 DIAGNOSIS — Z1231 Encounter for screening mammogram for malignant neoplasm of breast: Secondary | ICD-10-CM

## 2016-09-30 ENCOUNTER — Ambulatory Visit: Payer: Self-pay | Admitting: Family Medicine

## 2016-10-03 ENCOUNTER — Ambulatory Visit: Payer: Self-pay | Admitting: Family Medicine

## 2016-10-03 ENCOUNTER — Other Ambulatory Visit: Payer: Self-pay | Admitting: Family Medicine

## 2016-10-03 DIAGNOSIS — G8929 Other chronic pain: Secondary | ICD-10-CM

## 2016-10-03 DIAGNOSIS — M5441 Lumbago with sciatica, right side: Principal | ICD-10-CM

## 2016-10-03 DIAGNOSIS — M25562 Pain in left knee: Secondary | ICD-10-CM

## 2016-10-05 ENCOUNTER — Encounter: Payer: Self-pay | Admitting: Family Medicine

## 2016-10-05 ENCOUNTER — Ambulatory Visit (INDEPENDENT_AMBULATORY_CARE_PROVIDER_SITE_OTHER): Payer: Self-pay | Admitting: Family Medicine

## 2016-10-05 VITALS — BP 130/74 | HR 105 | Temp 98.3°F | Resp 16 | Ht 69.0 in | Wt 361.0 lb

## 2016-10-05 DIAGNOSIS — G8929 Other chronic pain: Secondary | ICD-10-CM

## 2016-10-05 DIAGNOSIS — R600 Localized edema: Secondary | ICD-10-CM

## 2016-10-05 DIAGNOSIS — L729 Follicular cyst of the skin and subcutaneous tissue, unspecified: Secondary | ICD-10-CM

## 2016-10-05 DIAGNOSIS — M5441 Lumbago with sciatica, right side: Secondary | ICD-10-CM

## 2016-10-05 DIAGNOSIS — M25562 Pain in left knee: Secondary | ICD-10-CM

## 2016-10-05 DIAGNOSIS — D229 Melanocytic nevi, unspecified: Secondary | ICD-10-CM

## 2016-10-05 MED ORDER — METHYLPREDNISOLONE ACETATE 40 MG/ML IJ SUSP
40.0000 mg | Freq: Once | INTRAMUSCULAR | Status: AC
  Administered 2016-10-05: 40 mg via INTRA_ARTICULAR

## 2016-10-05 MED ORDER — LIDOCAINE HCL (PF) 1 % IJ SOLN
4.0000 mL | Freq: Once | INTRAMUSCULAR | Status: AC
  Administered 2016-10-05: 4 mL

## 2016-10-05 MED ORDER — FUROSEMIDE 20 MG PO TABS: ORAL_TABLET | ORAL | 1 refills | Status: DC

## 2016-10-05 MED ORDER — PREDNISONE 20 MG PO TABS: ORAL_TABLET | ORAL | 0 refills | Status: DC

## 2016-10-05 NOTE — Assessment & Plan Note (Signed)
Persistent chronic bilateral LE edema, complicated by limited mobility and L knee injury. Still most consistent with chronic venous stasis and immobility as primary causes  Plan: 1. Continue RICE therapy as needed, ACE wrap, elevation 2. Refill temporary Lasix 9m daily vs BID up to 7 days per flare, can repeat if needed, only if symptomatic significant worsening swelling - caution to avoid overuse and to remain hydrated, avoid kidney injury 3. Follow-up as needed, treat underlying Knee and back pain

## 2016-10-05 NOTE — Progress Notes (Signed)
Subjective:    Patient ID: Maria Jacobson, female    DOB: 1975-10-14, 41 y.o.   MRN: [id number]  Maria Jacobson is a 41 y.o. female presenting on 10/05/2016 for Knee Pain (back pain chronic and was concerned about mole on R arm changing color and painful and also has cebacious cyst on neck which is getting bigger)   HPI   FOLLOW-UP LEFT KNEE, CHRONIC PAIN / Bilateral leg swelling: - Last seen by me 07/12/16 for chronic knee and back pain, previous steroid injection in Left Knee on 05/23/16 with improvement, she had continued on Meloxicam, Tylenol, and other conservative therapy. Also applied to [employer] care on 07/2016, after unable to establish with any local orthopedics due to financial problems - Today reports Left knee pain has been worsening over past few weeks to month, she did get dramatic improvement from steroid injection for about 1 month, then reduced to only mild improvement for next 2-3 months, but then wearing off. Other medicines help but do not control her pain. Severe pain similar to before, worse with prolonged standing for more than few minutes, prolonged seated / car travel is worse. - Requesting repeat steroid injection today - Additionally, states [facility] care was unable to find her application, and she has to re-submit the entire application. Requesting new one today, very upset about this. - Regarding LE swelling, still persistent without any improvement, uses elevation, ACE wrap, had prior work-up with dopplers unremarkable 06/2016, did have improvement with Lasix only temporary course given - Requesting refill Lasix for future car travel - Denies fall or new injury, worsening swelling, leg erythema  Chronic LOW BACK PAIN, bilateral / Right Sciatica / Morbid Obesity BMI >53 - Last follow-up 07/12/16 for chronic low back pain, see prior note for background info with chronic problem since 1998, now dramatic worsening over past >1 year - Today still  worsening, constant aching vs sharp burning pain, severity 10/10 with intermittent worsening most provoked by prolonged standing (>2-3 minutes), slightly improved by back extension. Right side seems worse with associated sciatica radiating down back of Right leg into foot with paresthesias sharp tingling pains without numbness. Not radiating down Left side. - More sedentary now - Significantly limits her function with ambulation and daily function - Continues Meloxicam, Flexeril, Gabapentin - previous med failures include Cymbalta, Tramadol, Hydrocodone/Percocet, Aleve, among other meds - Improved in past with Prednisone burst 7-10d, requesting refill to have on hand for upcoming car trip as it does provide relief, understands about weight gain / edema and side effects - Complicated by worsening weight gain with morbid obesity, attributes some of this to her abilify injections per psychiatry, causing wt gain, also taking Clonazepam 0.53m (2.514mdaily for past >5-6 years from psych for anxiety and bipolar) - Denies any fevers/chills, numbness, tingling, weakness, loss of control bladder/bowel incontinence or retention, unintentional wt loss, night sweats  Abnormal Mole / Neck Cyst Reports concern with pigmented mole on Right arm, unsure how long it has been there but noticed recently more raised with central firm hard area, no ulceration, pain bleeding or other symptoms. - Also with small pea sized knot on back of neck, concern for cyst, wanted it checked out - Family history paternal uncle with melanoma   Review of Systems Per HPI unless specifically indicated above    Objective:    BP 130/74   Pulse (!) 105   Temp 98.3 F (36.8 C) (Oral)   Resp 16   Ht 5'  9" (1.753 m)   Wt (!) 361 lb (163.7 kg)   BMI 53.31 kg/m   Wt Readings from Last 3 Encounters:  10/05/16 (!) 361 lb (163.7 kg)  07/12/16 (!) 340 lb (154.2 kg)  06/15/16 (!) 342 lb (155.1 kg)    Physical Exam  Constitutional: She  appears well-developed and well-nourished. No distress.  Well-appearing, discomfort with low back and left knee pain, cooperative, obese  HENT:  Head: Normocephalic and atraumatic.  Mouth/Throat: Oropharynx is clear and moist.  Eyes: Conjunctivae are normal.  Cardiovascular: Normal rate, regular rhythm, normal heart sounds and intact distal pulses.   No murmur heard. Mild tachycardia  Pulmonary/Chest: Effort normal and breath sounds normal. No respiratory distress. She has no wheezes. She has no rales.  Musculoskeletal: She exhibits edema (Mostly unchanged bilateral pitting edema, symmetrical lower extremity lower leg / ankles).  Lower extremities without erythema, non tender calves.  Stable edema L>R extending into foot /ankle  Low Back - mostly unchanged from last visit Inspection: Normal appearance, Large body habitus, no spinal deformity, symmetrical. Palpation: No tenderness over spinous processes. Bilateral lumbar paraspinal muscles mild tender R>L deeper pain not easily reproduced by palpation, with some R>L lower lumbar / SI hypertonicity/spasm. ROM: Limited active ROM forward flex due to back pain and stiffness and body habitus / some improved back extension, rotation L/R without discomfort Special Testing: Seated SLR negative for radicular pain bilaterally  Seated facet load test positive R>L low back pain Strength: Bilateral hip flex/ext 5/5, knee flex/ext 5/5, ankle dorsiflex/plantarflex 5/5 Neurovascular: intact distal sensation to light touch  Left Knee Inspection: Unchanged abnormal bulky large appearance of knee with surrounding tissue, unable to determine if effusion. No erythema. Palpation: Moderate +TTP Left knee only medial joint line. Mild crepitus. Did not repeat meniscal testing today ROM: Reduced ROM flex / ext due to swelling and pain. Strength: 5/5 intact knee flex/ext, ankle dorsi/plantarflex Neurovascular: distally intact sensation light touch and pulses    Neurological: She is alert. She has normal reflexes.  Skin: Skin is warm and dry. She is not diaphoretic. No erythema.  Right upper arm - less than 1 x 1 cm slightly pigmented raised lesion with firm nodular center, mild tender, no erythema, no irregular border or hyperpigmentation, appears to be inclusion type cyst with possible ingrown hair  Neck, posterior - mid to left in crease with 0.5 x 0.5 pea sized firm superficial nodular mass, feels like small firm cyst, again possible inclusion cyst. Not deeper into subcutaneous tissue. Non tender, no erythema, no drainage  Psychiatric: She has a normal mood and affect. Her behavior is normal.  Nursing note and vitals reviewed.       ________________________________________________________ PROCEDURE NOTE Date: 10/05/16 Left Knee corticosteroid injection Discussed benefits and risks (including pain, bleeding, infection, steroid flare). Verbal consent given by patient. Medication:  1 cc Depo-medrol 67m and 4 cc Lidocaine 1% without epi Time Out taken  Landmarks identified. Area cleansed with alcohol wipes.Using 21 gauge and 1, 1/2 inch needle, Left knee joint space was injected (with above listed medication) via medial approach cold spray used for superficial anesthetic.Sterile bandage placed.Patient tolerated procedure well without bleeding or paresthesias.No complications.  --------------  I have personally reviewed the following lab results from 07/06/16.  Results for orders placed or performed in visit on 07/06/16  COMPLETE METABOLIC PANEL WITH GFR  Result Value Ref Range   Sodium 138 135 - 146 mmol/L   Potassium 4.6 3.5 - 5.3 mmol/L   Chloride 101  98 - 110 mmol/L   CO2 25 20 - 31 mmol/L   Glucose, Bld 89 65 - 99 mg/dL   BUN 13 7 - 25 mg/dL   Creat 0.90 0.50 - 1.10 mg/dL   Total Bilirubin 0.4 0.2 - 1.2 mg/dL   Alkaline Phosphatase 65 33 - 115 U/L   AST 14 10 - 30 U/L   ALT 20 6 - 29 U/L   Total Protein 6.7 6.1 - 8.1 g/dL    Albumin 4.0 3.6 - 5.1 g/dL   Calcium 9.0 8.6 - 10.2 mg/dL   GFR, Est African American >89 >=60 mL/min   GFR, Est Non African American 79 >=60 mL/min  Sedimentation rate  Result Value Ref Range   Sed Rate 11 0 - 20 mm/hr  TSH  Result Value Ref Range   TSH 0.58 mIU/L  C-reactive protein  Result Value Ref Range   CRP 6.2 <8.0 mg/L  Rheumatoid factor  Result Value Ref Range   Rhuematoid fact SerPl-aCnc <93 <73 IU/mL  Cyclic citrul peptide antibody, IgG  Result Value Ref Range   Cyclic Citrullin Peptide Ab <16 Units  VITAMIN D 25 Hydroxy (Vit-D Deficiency, Fractures)  Result Value Ref Range   Vit D, 25-Hydroxy 33 30 - 100 ng/mL      Assessment & Plan:   Problem List Items Addressed This Visit    Left medial knee pain    Recurrent worsening again now approx 4 months after last L knee steroid injection (05/23/16), with chronic 1.5 yr+ Left medial and generalized knee pain with recurrent swelling, instability and mechanical symptoms. Still concern remains for possible left medial meniscal injury. Complicated by factor of morbid obesity.  Plan: 1. Left knee steroid injection performed today, see procedure note 2. Continue RICE therapy, activity modification, Flexeril, Gabapentin, Meloxicam 3. Reprinted [place] care application to be re-submitted to [place], needs higher acuity care, apparently application was not received since 07/2016 4. Follow-up as needed - consider repeat injection q 3-6 months if helping, ideally needs more definitive Ortho management      Relevant Medications   methylPREDNISolone acetate (DEPO-MEDROL) injection 40 mg (Completed)   lidocaine (PF) (XYLOCAINE) 1 % injection 4 mL (Completed)   Chronic bilateral low back pain with right-sided sciatica - Primary    Persistent worsening w/o improvement chronic bilateral (R>L) LBP with associated R sciatica. Multifactorial etiology, old injury, sedentary, morbid obesity, L knee injury. - Some improvement only  temporarily to conservative therapy, failed many treatments, now limited in options, awaiting [facility] eval charity care but now needs to re-submit application, major barriers to care due to financial barriers  Plan: 1. Continue Meloxicam 68m daily, Flexeril 127mTID PRN, Tylenol breakthrough, heating pad, topicals PRN 2. Continue Gabapentin 30069mID  3. Rx given for Prednisone 10 day taper, only use if acute worsening low back pain, with upcoming travel for work, counseling on risks with wt gain / edema, caution with frequent use 4. [place] - [facility] with worsening problems, and limited options now 5. Follow-up as needed, within 3 months, consider future other med options - lyrica, other muscle relaxants      Relevant Medications   predniSONE (DELTASONE) 20 MG tablet   methylPREDNISolone acetate (DEPO-MEDROL) injection 40 mg (Completed)   Bilateral lower extremity edema    Persistent chronic bilateral LE edema, complicated by limited mobility and L knee injury. Still  most consistent with chronic venous stasis and immobility as primary causes  Plan: 1. Continue RICE therapy as needed, ACE wrap, elevation 2. Refill temporary Lasix 47m daily vs BID up to 7 days per flare, can repeat if needed, only if symptomatic significant worsening swelling - caution to avoid overuse and to remain hydrated, avoid kidney injury 3. Follow-up as needed, treat underlying Knee and back pain      Relevant Medications   furosemide (LASIX) 20 MG tablet   Atypical mole    Right upper arm small < 1 cm benign appearing mildly pigmented lesion with raised nodular center, no ulceration or asymmetry, no hyperpigmentation or other red flags. - Discussion on monitor for now, if changes or other concerns can consider options with punch biopsy vs Dermatology referral       Other Visit Diagnoses     Skin cyst       Posterior neck, most consistent with benign superficial inclusion cyst firm nodular center, no sign of infection. Reassurance, may drain/resolve spontaneously     Meds ordered this encounter  Medications  . furosemide (LASIX) 20 MG tablet    Sig: Take 235mdaily as needed for swelling, if needed can increase to 2 tablets 40106mr try 26m50mice a day for up to 7 days max per flare    Dispense:  30 tablet    Refill:  1  . predniSONE (DELTASONE) 20 MG tablet    Sig: Take 60mg64m days, 40mg 74mdays, 26mg x71mays for total of 10 days    Dispense:  19 tablet    Refill:  0  . methylPREDNISolone acetate (DEPO-MEDROL) injection 40 mg  . lidocaine (PF) (XYLOCAINE) 1 % injection 4 mL     Follow up plan: Return in about 3 months (around 01/05/2017) for Knee, Back Pain.   [facility] 10/05/2016, 11:56 PM

## 2016-10-05 NOTE — Assessment & Plan Note (Signed)
Persistent worsening w/o improvement chronic bilateral (R>L) LBP with associated R sciatica. Multifactorial etiology, old injury, sedentary, morbid obesity, L knee injury. - Some improvement only temporarily to conservative therapy, failed many treatments, now limited in options, awaiting [facility] eval charity care but now needs to re-submit application, major barriers to care due to financial barriers  Plan: 1. Continue Meloxicam 79m daily, Flexeril 151mTID PRN, Tylenol breakthrough, heating pad, topicals PRN 2. Continue Gabapentin 30051mID  3. Rx given for Prednisone 10 day taper, only use if acute worsening low back pain, with upcoming travel for work, counseling on risks with wt gain / edema, caution with frequent use 4. [place] - patient [facility] complete again [facility] with worsening problems, and limited options now 5. Follow-up as needed, within 3 months, consider future other med options - lyrica, other muscle relaxants

## 2016-10-05 NOTE — Assessment & Plan Note (Signed)
Right upper arm small < 1 cm benign appearing mildly pigmented lesion with raised nodular center, no ulceration or asymmetry, no hyperpigmentation or other red flags. - Discussion on monitor for now, if changes or other concerns can consider options with punch biopsy vs Dermatology referral

## 2016-10-05 NOTE — Assessment & Plan Note (Signed)
Recurrent worsening again now approx 4 months after last L knee steroid injection (05/23/16), with chronic 1.5 yr+ Left medial and generalized knee pain with recurrent swelling, instability and mechanical symptoms. Still concern remains for possible left medial meniscal injury. Complicated by factor of morbid obesity.  Plan: 1. Left knee steroid injection performed today, see procedure note 2. Continue RICE therapy, activity modification, Flexeril, Gabapentin, Meloxicam 3. Reprinted [place] care application to be re-submitted to [place], needs higher acuity care, apparently application was not received since 07/2016 4. Follow-up as needed - consider repeat injection q 3-6 months if helping, ideally needs more definitive Ortho management

## 2016-10-05 NOTE — Patient Instructions (Signed)
Thank you for coming in to clinic today.  1. You received a Left Knee Joint steroid injection today. - Lidocaine numbing medicine may ease the pain initially for a few hours until it wears off - As discussed, you may experience a "steroid flare" this evening or within 24-48 hours, anytime medicine is injected into an inflamed joint it can cause the pain to get worse temporarily - Everyone responds differently to these injections, it depends on the patient and the severity of the joint problem, it may provide anywhere from days to weeks, to months of relief. Ideal response is >6 months relief - Try to take it easy for next 1-2 days, avoid over activity and strain on joint (limit walking for knee) - Recommend the following:   - For swelling - rest, compression sleeve / ACE wrap, elevation, and ice packs as needed for first few days   - For pain in future may use heating pad or moist heat as needed  Re ordered Prednisone 10 day burst - start this when needed for your upcoming trip  Completed [facility] care paperwork for Orthopedics  Refilled Lasix - instructions as discussed, ideally not daily but can use as needed for flares  Reassurance, mole on R arm unlikely to be cancerous, does not look like melanoma, we will monitor it, and if need can do a punch biopsy in the office.  Likely inclusion cyst on upper neck, it does not have a skin abnormality no mole, and does not feel like a deeper sebaceous cyst.  Please schedule a follow-up appointment with Dr. Parks Ranger in 3 months for Knee Pain / Back Pain follow-up  If you have any other questions or concerns, please feel free to call the clinic or send a message through [place]. You may also schedule an earlier appointment if necessary.  Maria Putnam, DO [place]

## 2016-10-12 ENCOUNTER — Ambulatory Visit: Payer: Self-pay

## 2016-11-09 ENCOUNTER — Ambulatory Visit: Payer: Self-pay

## 2016-11-26 ENCOUNTER — Other Ambulatory Visit: Payer: Self-pay | Admitting: Family Medicine

## 2016-11-26 DIAGNOSIS — G8929 Other chronic pain: Secondary | ICD-10-CM

## 2016-11-26 DIAGNOSIS — M5441 Lumbago with sciatica, right side: Principal | ICD-10-CM

## 2016-11-28 NOTE — Telephone Encounter (Signed)
Last ov 10/05/16 Last filled 08/19/16 Please review. Thank you. sd

## 2016-12-01 ENCOUNTER — Ambulatory Visit: Payer: Self-pay

## 2016-12-05 ENCOUNTER — Encounter: Payer: Self-pay | Admitting: Family Medicine

## 2016-12-05 ENCOUNTER — Other Ambulatory Visit: Payer: Self-pay | Admitting: Family Medicine

## 2016-12-05 DIAGNOSIS — M5441 Lumbago with sciatica, right side: Principal | ICD-10-CM

## 2016-12-05 DIAGNOSIS — G8929 Other chronic pain: Secondary | ICD-10-CM

## 2016-12-05 DIAGNOSIS — R600 Localized edema: Secondary | ICD-10-CM

## 2016-12-05 MED ORDER — FUROSEMIDE 20 MG PO TABS: ORAL_TABLET | ORAL | 1 refills | Status: DC

## 2016-12-05 NOTE — Telephone Encounter (Signed)
Last ov  10/05/16 Last filled 08/19/16

## 2017-01-06 ENCOUNTER — Other Ambulatory Visit: Payer: Self-pay

## 2017-01-14 ENCOUNTER — Other Ambulatory Visit: Payer: Self-pay | Admitting: Family Medicine

## 2017-01-14 DIAGNOSIS — R600 Localized edema: Secondary | ICD-10-CM

## 2017-02-13 ENCOUNTER — Ambulatory Visit: Payer: Self-pay | Admitting: Family Medicine

## 2017-02-14 ENCOUNTER — Ambulatory Visit (INDEPENDENT_AMBULATORY_CARE_PROVIDER_SITE_OTHER): Payer: Self-pay | Admitting: Family Medicine

## 2017-02-14 ENCOUNTER — Encounter: Payer: Self-pay | Admitting: Family Medicine

## 2017-02-14 VITALS — BP 134/88 | HR 102 | Temp 98.0°F | Resp 16 | Ht 69.0 in | Wt 362.8 lb

## 2017-02-14 DIAGNOSIS — M7989 Other specified soft tissue disorders: Secondary | ICD-10-CM

## 2017-02-14 DIAGNOSIS — M25562 Pain in left knee: Secondary | ICD-10-CM

## 2017-02-14 DIAGNOSIS — F314 Bipolar disorder, current episode depressed, severe, without psychotic features: Secondary | ICD-10-CM

## 2017-02-14 DIAGNOSIS — M79662 Pain in left lower leg: Secondary | ICD-10-CM

## 2017-02-14 DIAGNOSIS — Z6841 Body Mass Index (BMI) 40.0 and over, adult: Secondary | ICD-10-CM

## 2017-02-14 MED ORDER — METHYLPREDNISOLONE ACETATE 40 MG/ML IJ SUSP
40.0000 mg | Freq: Once | INTRAMUSCULAR | Status: AC
  Administered 2017-02-14: 40 mg via INTRA_ARTICULAR

## 2017-02-14 MED ORDER — LIDOCAINE HCL (PF) 1 % IJ SOLN
4.0000 mL | Freq: Once | INTRAMUSCULAR | Status: AC
  Administered 2017-02-14: 4 mL

## 2017-02-14 NOTE — Progress Notes (Signed)
Subjective:    Patient ID: Maria Jacobson, female    DOB: [birth year], 41 y.o.   MRN: [id number]  Maria Jacobson is a 41 y.o. female presenting on 02/14/2017 for Knee Pain (left side )   HPI  FOLLOW-UP LEFT KNEE, CHRONIC PAIN with concern for L torn meniscus / Bilateral leg swelling: - Last visit with me 10/05/16, for same problem, treated with L knee steroid injection (#2, first injection was 05/2016), continued Flexeril, Gabapentin, Meloxicam, and re-submitted [facility] Application for Orthopedics, see prior notes for background information. - Interval update with overall good improvement on steroid injections, first injection lasted >5 months, then 2nd injection lasted about 4 months, now mostly worn off. Additionally she thinks Gabapentin no longer helping back pain, would like to stop due to concern wt gain. - Today patient reports significant worsening L knee pain, no new injury, seems to be progression of same pain, also worsening to involve L posterior knee and down leg and into achilles and muscles. Pain worse with activity and prolonged standing, she is often limited with travel due to work affecting her pain - Still taking medicines, regularly Flexeril, Meloxicam, Tylenol, using transdermal cream for muscles with relief - Regarding LE swelling, still persistent without any improvement, intermittent relief with Lasix PRN but tries not to overuse, often takes Lasix with prolonged travel, uses elevation, ACE wrap, had prior work-up with dopplers unremarkable 06/2016 - Denies fall or new injury, worsening swelling, leg erythema  Bipolar Disorder with Anxiety Followed by Psychiatry ([facility] in [facility]), continues current meds Abilify, Xanax, Adderall, Clonazepam, Lamictal, Seroquel, awaiting recent record update. She is concern about seroquel and gabapentin with wt gain.   Review of Systems Per HPI unless specifically indicated above    Objective:    BP 134/88   Pulse (!)  102   Temp 98 F (36.7 C) (Oral)   Resp 16   Ht 5' 9"  (1.753 m)   Wt (!) 362 lb 12.8 oz (164.6 kg)   BMI 53.58 kg/m   Wt Readings from Last 3 Encounters:  02/14/17 (!) 362 lb 12.8 oz (164.6 kg)  10/05/16 (!) 361 lb (163.7 kg)  07/12/16 (!) 340 lb (154.2 kg)    Physical Exam  Constitutional: She is oriented to person, place, and time. She appears well-developed and well-nourished. No distress.  Well-appearing, discomfort with left knee pain, cooperative, morbidly obese  HENT:  Head: Normocephalic and atraumatic.  Mouth/Throat: Oropharynx is clear and moist.  Eyes: Conjunctivae are normal.  Cardiovascular: Normal rate, regular rhythm, normal heart sounds and intact distal pulses.   No murmur heard. Mild tachycardia  Pulmonary/Chest: Effort normal and breath sounds normal. No respiratory distress. She has no wheezes. She has no rales.  Musculoskeletal: She exhibits edema (Stable unchanged bilateral pitting edema, symmetrical lower extremity lower leg / ankles).  Lower Extremities: - See Knee - Additionally mild to moderate tenderness bilateral lower extremity anterior and posterior legs, and L knee posteriorly  Left Knee Inspection: Persistent abnormal bulky large appearance of knee with surrounding tissue, unable to determine if effusion. No erythema. Palpation: Moderate +TTP Left knee only medial joint line. Mild crepitus. Also tenderness posterior L knee. Did not repeat meniscal testing today due to acute pain - previously significant abnormal standing Thessaly medially ROM: Reduced ROM flex / ext due to swelling and pain. Strength: 5/5 intact knee flex/ext, ankle dorsi/plantarflex Neurovascular: distally intact sensation light touch and pulses  Neurological: She is alert and oriented to person, place, and  time.  Distal sensation intact to light touch  Skin: Skin is warm and dry. No rash noted. She is not diaphoretic. No erythema.  Psychiatric: She has a normal mood and affect.  Her behavior is normal.  Well groomed, good eye contact, normal speech and thoughts  Nursing note and vitals reviewed.  ________________________________________________________ PROCEDURE NOTE Date: 02/14/17 Left Knee corticosteroid injection Discussed benefits and risks (including pain, bleeding, infection, steroid flare). Verbal consent given by patient. Medication:  1 cc Depo-medrol 61m and 4 cc Lidocaine 1% without epi Time Out taken  Landmarks identified. Area cleansed with alcohol wipes.Using 21 gauge and 1, 1/2 inch needle, Left knee joint space was injected (with above listed medication) via medial approach cold spray used for superficial anesthetic. Notable mild amount of bleeding initial brisk amount then quickly subsided with pressure and hemostasis, EBL < 10 cc, possibly with a knee effusion with some blood.Sterile bandage placed.Patient tolerated procedure well without bleeding or paresthesias. --------------   Results for orders placed or performed in visit on 07/06/16  COMPLETE METABOLIC PANEL WITH GFR  Result Value Ref Range   Sodium 138 135 - 146 mmol/L   Potassium 4.6 3.5 - 5.3 mmol/L   Chloride 101 98 - 110 mmol/L   CO2 25 20 - 31 mmol/L   Glucose, Bld 89 65 - 99 mg/dL   BUN 13 7 - 25 mg/dL   Creat 0.90 0.50 - 1.10 mg/dL   Total Bilirubin 0.4 0.2 - 1.2 mg/dL   Alkaline Phosphatase 65 33 - 115 U/L   AST 14 10 - 30 U/L   ALT 20 6 - 29 U/L   Total Protein 6.7 6.1 - 8.1 g/dL   Albumin 4.0 3.6 - 5.1 g/dL   Calcium 9.0 8.6 - 10.2 mg/dL   GFR, Est African American >89 >=60 mL/min   GFR, Est Non African American 79 >=60 mL/min  Sedimentation rate  Result Value Ref Range   Sed Rate 11 0 - 20 mm/hr  TSH  Result Value Ref Range   TSH 0.58 mIU/L  C-reactive protein  Result Value Ref Range   CRP 6.2 <8.0 mg/L  Rheumatoid factor  Result Value Ref Range   Rhuematoid fact SerPl-aCnc <<12<<87IU/mL  Cyclic citrul peptide antibody, IgG  Result Value Ref Range    Cyclic Citrullin Peptide Ab <16 Units  VITAMIN D 25 Hydroxy (Vit-D Deficiency, Fractures)  Result Value Ref Range   Vit D, 25-Hydroxy 33 30 - 100 ng/mL      Assessment & Plan:   Problem List Items Addressed This Visit    Pain and swelling of left lower leg    Persistent chronic bilateral LE edema, complicated by limited mobility and L knee injury. Still most consistent with chronic venous stasis and immobility as primary causes  Plan: 1. Continue RICE therapy as needed, ACE wrap, elevation 2. May continue Lasix 278mdaily vs BID up to 7 days per flare, can repeat if needed, only if symptomatic significant worsening swelling - caution to avoid overuse and to remain hydrated, avoid kidney injury 3. Follow-up as needed, treat underlying Knee and back pain      Morbid obesity with BMI of 45.0-49.9, adult ([place]   Still worsening wt gain, now steady in past 4 months Improved on diet, limited financially. Limited due to knee pain only seated upper body exercises - Discontinue Gabapentin due to wt gain - Concern 2nd gen anti-psychotics may wt gain      Left medial  knee pain - Primary    Recurrent worsening again now approx 4 months after last L knee steroid injection (09/2016), with chronic 1.5 to 2 yr+ Left medial and generalized knee pain with recurrent swelling, instability and mechanical symptoms. Still concern remains for possible left medial meniscal injury. Complicated by factor of morbid obesity. - Significant barrier to care with no insurance / limited financial, awaiting charity care application for [place]: 1. Left knee steroid injection (#3) performed today, see procedure note 2. Continue RICE therapy, activity modification, Flexeril, Meloxicam 3. Advised her to contact [facility] to follow-up on her charity care application status since has not heard in several months. Also given long duration of problem and debilitating nature, advised patient to reach out to more Orthopedics  offices to check payment plans and if any self pay options - likely needs MRI and possible surgery / arthroscopy - handout with locations of several ortho in [place], may try others, she was denied at local orthopedics in [place] due to lack of ins 4. Follow-up as needed - consider repeat injection q 3-6 months if helping, ideally needs more definitive Ortho management - future consider topical NSAID diclofenac (cost issue, may notify office if can afford and will send rx)      Relevant Medications   methylPREDNISolone acetate (DEPO-MEDROL) injection 40 mg (Completed)   lidocaine (PF) (XYLOCAINE) 1 % injection 4 mL (Completed)   Bipolar affective disorder, depressed, severe ([place])    Stable, chronic problem Followed by Psychiatry Rondall Allegra Controlled on current med regimen per Psychiatry, no med management from our office on these and related problems.        Meds ordered this encounter  Medications  .           . methylPREDNISolone acetate (DEPO-MEDROL) injection 40 mg  . lidocaine (PF) (XYLOCAINE) 1 % injection 4 mL     Follow up plan: Return in about 3 months (around 05/17/2017) for L knee pain.   [name], [facility] 02/15/2017, 12:13 AM

## 2017-02-14 NOTE — Patient Instructions (Addendum)
Thank you for coming to the clinic today.  1. Also ask Pharmacist for cost for topical Diclofenac sodium gel 1% use 2-3 times daily for knee pain as needed, (NSAID anti-inflammatory, so would HOLD meloxicam if using topical)  You received a Left Knee Joint steroid injection today. - Lidocaine numbing medicine may ease the pain initially for a few hours until it wears off - As discussed, you may experience a "steroid flare" this evening or within 24-48 hours, anytime medicine is injected into an inflamed joint it can cause the pain to get worse temporarily - Everyone responds differently to these injections, it depends on the patient and the severity of the joint problem, it may provide anywhere from days to weeks, to months of relief. Ideal response is >6 months relief - Try to take it easy for next 1-2 days, avoid over activity and strain on joint (limit walking for knee) - Recommend the following:   - For swelling - rest, compression sleeve / ACE wrap, elevation, and ice packs as needed for first few days   - For pain in future may use heating pad or moist heat as needed  Check status of [place] Application - notify us with update  Then also can check with various other ortho offices in [place], ask them for other recommendations as well.  Notify them about your past history and chronic L knee pain, strong suspicion or concern for Left Medial Meniscus Injury vs Tear  Need further imaging, MRI, and evaluation.  [place] [place], [place] [address] Phone: [phone]  [facility] [address] [place] [address] Phone: [phone]  [place] (first appointment required to go to [facility]) [facility]  [address] [place], [place] [address] Phone: [phone]  [facility]  Address: [place], [place],  [address]   Phone: [phone]  Please schedule a Follow-up Appointment to: Return in about 3 months (around 05/17/2017) for L knee pain.  If you have any other questions or concerns, please feel free to call the clinic or send a message through [place]. You may also schedule an earlier appointment if necessary.  Additionally, you may be receiving a survey about your experience at our clinic within a few days to 1 week by e-mail or mail. We value your feedback.  [name], DO [place]

## 2017-02-15 NOTE — Assessment & Plan Note (Addendum)
Recurrent worsening again now approx 4 months after last L knee steroid injection (09/2016), with chronic 1.5 to 2 yr+ Left medial and generalized knee pain with recurrent swelling, instability and mechanical symptoms. Still concern remains for possible left medial meniscal injury. Complicated by factor of morbid obesity. - Significant barrier to care with no insurance / limited financial, awaiting charity care application for [place]: 1. Left knee steroid injection (#3) performed today, see procedure note 2. Continue RICE therapy, activity modification, Flexeril, Meloxicam 3. Advised her to contact [facility] to follow-up on her charity care application status since has not heard in several months. Also given long duration of problem and debilitating nature, advised patient to reach out to more Orthopedics offices to check payment plans and if any self pay options - likely needs MRI and possible surgery / arthroscopy - handout with locations of several ortho in [place], may try others, she was denied at local orthopedics in [place] due to lack of ins 4. Follow-up as needed - consider repeat injection q 3-6 months if helping, ideally needs more definitive Ortho management - future consider topical NSAID diclofenac (cost issue, may notify office if can afford and will send rx)

## 2017-02-15 NOTE — Assessment & Plan Note (Signed)
Still worsening wt gain, now steady in past 4 months Improved on diet, limited financially. Limited due to knee pain only seated upper body exercises - Discontinue Gabapentin due to wt gain - Concern 2nd gen anti-psychotics may wt gain

## 2017-02-15 NOTE — Assessment & Plan Note (Signed)
Stable, chronic problem Followed by Psychiatry Rondall Allegra Controlled on current med regimen per Psychiatry, no med management from our office on these and related problems.

## 2017-02-15 NOTE — Assessment & Plan Note (Signed)
Persistent chronic bilateral LE edema, complicated by limited mobility and L knee injury. Still most consistent with chronic venous stasis and immobility as primary causes  Plan: 1. Continue RICE therapy as needed, ACE wrap, elevation 2. May continue Lasix 51m daily vs BID up to 7 days per flare, can repeat if needed, only if symptomatic significant worsening swelling - caution to avoid overuse and to remain hydrated, avoid kidney injury 3. Follow-up as needed, treat underlying Knee and back pain

## 2017-03-01 ENCOUNTER — Ambulatory Visit
Admission: RE | Admit: 2017-03-01 | Discharge: 2017-03-01 | Disposition: A | Discharge status: Home / Self Care | Payer: No Typology Code available for payment source | Source: Ambulatory Visit | Attending: Family Medicine | Admitting: Family Medicine

## 2017-03-01 DIAGNOSIS — Z1231 Encounter for screening mammogram for malignant neoplasm of breast: Secondary | ICD-10-CM

## 2017-04-04 ENCOUNTER — Other Ambulatory Visit: Payer: Self-pay | Admitting: Family Medicine

## 2017-04-04 DIAGNOSIS — G8929 Other chronic pain: Secondary | ICD-10-CM

## 2017-04-04 DIAGNOSIS — M5441 Lumbago with sciatica, right side: Principal | ICD-10-CM

## 2017-04-07 ENCOUNTER — Other Ambulatory Visit: Payer: Self-pay | Admitting: Family Medicine

## 2017-04-07 DIAGNOSIS — G8929 Other chronic pain: Secondary | ICD-10-CM

## 2017-04-07 DIAGNOSIS — M25562 Pain in left knee: Secondary | ICD-10-CM

## 2017-04-07 DIAGNOSIS — M5441 Lumbago with sciatica, right side: Principal | ICD-10-CM

## 2017-05-15 ENCOUNTER — Encounter (HOSPITAL_COMMUNITY): Payer: Self-pay

## 2017-06-13 ENCOUNTER — Encounter: Payer: Self-pay | Admitting: Family Medicine

## 2017-06-13 ENCOUNTER — Ambulatory Visit (INDEPENDENT_AMBULATORY_CARE_PROVIDER_SITE_OTHER): Payer: Self-pay | Admitting: Family Medicine

## 2017-06-13 ENCOUNTER — Other Ambulatory Visit: Payer: Self-pay | Admitting: Family Medicine

## 2017-06-13 VITALS — BP 115/75 | HR 97 | Temp 98.2°F | Resp 16 | Ht 69.0 in | Wt 341.0 lb

## 2017-06-13 DIAGNOSIS — Z6841 Body Mass Index (BMI) 40.0 and over, adult: Secondary | ICD-10-CM

## 2017-06-13 DIAGNOSIS — Z0000 Encounter for general adult medical examination without abnormal findings: Secondary | ICD-10-CM

## 2017-06-13 DIAGNOSIS — R600 Localized edema: Secondary | ICD-10-CM

## 2017-06-13 DIAGNOSIS — E782 Mixed hyperlipidemia: Secondary | ICD-10-CM

## 2017-06-13 DIAGNOSIS — F419 Anxiety disorder, unspecified: Secondary | ICD-10-CM

## 2017-06-13 DIAGNOSIS — Z79899 Other long term (current) drug therapy: Secondary | ICD-10-CM

## 2017-06-13 DIAGNOSIS — M25562 Pain in left knee: Secondary | ICD-10-CM

## 2017-06-13 DIAGNOSIS — R7309 Other abnormal glucose: Secondary | ICD-10-CM

## 2017-06-13 DIAGNOSIS — F314 Bipolar disorder, current episode depressed, severe, without psychotic features: Secondary | ICD-10-CM

## 2017-06-13 DIAGNOSIS — L720 Epidermal cyst: Secondary | ICD-10-CM | POA: Insufficient documentation

## 2017-06-13 MED ORDER — METHYLPREDNISOLONE ACETATE 40 MG/ML IJ SUSP
40.0000 mg | Freq: Once | INTRAMUSCULAR | Status: AC
  Administered 2017-06-13: 40 mg via INTRA_ARTICULAR

## 2017-06-13 MED ORDER — LIDOCAINE-EPINEPHRINE 2 %-1:100000 IJ SOLN
2.0000 mL | Freq: Once | INTRAMUSCULAR | Status: AC
  Administered 2017-06-13: 2 mL via INTRADERMAL

## 2017-06-13 MED ORDER — FUROSEMIDE 20 MG PO TABS: ORAL_TABLET | ORAL | 2 refills | Status: DC

## 2017-06-13 MED ORDER — LIDOCAINE HCL (PF) 1 % IJ SOLN
4.0000 mL | Freq: Once | INTRAMUSCULAR | Status: AC
  Administered 2017-06-13: 4 mL

## 2017-06-13 NOTE — Patient Instructions (Addendum)
Thank you for coming to the clinic today.  1.  You received a Left Knee Joint steroid injection today. - Lidocaine numbing medicine may ease the pain initially for a few hours until it wears off - As discussed, you may experience a "steroid flare" this evening or within 24-48 hours, anytime medicine is injected into an inflamed joint it can cause the pain to get worse temporarily - Everyone responds differently to these injections, it depends on the patient and the severity of the joint problem, it may provide anywhere from days to weeks, to months of relief. Ideal response is >6 months relief - Try to take it easy for next 1-2 days, avoid over activity and strain on joint (limit walking for knee) - Recommend the following:   - For swelling - rest, compression sleeve / ACE wrap, elevation, and ice packs as needed for first few days   - For pain in future may use heating pad or moist heat as needed  2.  Cyst on back of neck, will drain today. Keep it clean with soap and water, and may continue warm compresses some more may drain - If gets red and swollen notify us for considering antibiotics. It may come back  Refilled Lasix  DUE for FASTING BLOOD WORK (no food or drink after midnight before the lab appointment, only water or coffee without cream/sugar on the morning of)  SCHEDULE "Lab Only" visit in the morning at the clinic for lab draw in 3 MONTHS  - Make sure Lab Only appointment is at about 1 week before your next appointment, so that results will be available  For Lab Results, once available within 2-3 days of blood draw, you can can log in to MyChart online to view your results and a brief explanation. Also, we can discuss results at next follow-up visit.   Please schedule a Follow-up Appointment to: Return in about 3 months (around 09/13/2017) for Annual Physical.  If you have any other questions or concerns, please feel free to call the clinic or send a message through [place].  You may also schedule an earlier appointment if necessary.  Additionally, you may be receiving a survey about your experience at our clinic within a few days to 1 week by e-mail or mail. We value your feedback.  [name], DO [place]

## 2017-06-13 NOTE — Assessment & Plan Note (Signed)
Recurrent worsening again now approx 4 months after last L knee steroid injection (01/2017), with chronic >2 yr+ Left medial and generalized knee pain with recurrent swelling, instability and mechanical symptoms. Still concern remains for possible left medial meniscal injury. Complicated by factor of morbid obesity. - Significant barrier to care with no insurance / limited financial, awaiting charity care application for [place]: 1. Left knee steroid injection (#4) performed today, see procedure note 2. Continue RICE therapy, activity modification, Flexeril, Meloxicam 3. Advised her to contact [facility] to follow-up on her charity care application - now awaiting financial update, she has declined other local Ortho offices due to lack of coverage self pay - likely needs MRI and possible surgery / arthroscopy  4. Follow-up as needed - consider repeat injection q 3-6 months if helping, ideally needs more definitive Ortho management

## 2017-06-13 NOTE — Progress Notes (Signed)
Subjective:    Patient ID: Maria Jacobson, female    DOB: [birth year], 41 y.o.   MRN: [id number]  Maria Jacobson is a 41 y.o. female presenting on 06/13/2017 for Mass (middle on neck ) and Knee Pain (left side)   HPI   FOLLOW-UP LEFT KNEE, CHRONIC PAIN with concern for L torn meniscus / Bilateral leg swelling: - Last visit with me 02/14/17, for same problem, treated with L knee steroid injection (#3), continued Flexeril, Meloxicam, and re-submitted [facility] Application for Orthopedics, see prior notes for background information. - Interval update with overall good improvement on steroid injections, seems to last anywhere from 3 to 5 months, previous inj lasted 3.5 months now starting to have some worsening pain. Remains off Gabapentin. She is waiting for [facility] to receive her most updated financial paperwork - Today patient reports significant worsening L knee pain, no new injury, seems to be progression of same pain. Pain worse with activity and prolonged standing, she is often limited with travel due to work affecting her pain - Still taking medicines, regularly Flexeril 63m TID (needs refill), Meloxicam 137m Tylenol, using transdermal cream for muscles with relief - Regarding LE swelling, still persistent without any improvement, intermittent relief with Lasix PRN but tries not to overuse, often takes Lasix with prolonged travel, uses elevation, ACE wrap, had prior work-up with dopplers unremarkable 06/2016 - [facility] - Denies fall or new injury, worsening swelling, leg erythema  FOLLOW-UP Neck Cyst - Last visit with me for this problem initial visit 10/05/16, treated with trial of warm compresses, see prior notes for background information. - Interval update with no improvement, has not resolved, no drainage - Today patient reports some worsening with enlarged cyst on back of neck,  seems to be more sore due to pressure, no redness, asking for it to be drained or removed today - Denies fever/chills, spreading redness, drainage  Additionally for mood - started Light Box Therapy, seems to help  Health Maintenance: - Due for Flu Shot, declines today despite counseling on benefits  No flowsheet data found.  Social History   Tobacco Use  . Smoking status: Never Smoker  . Smokeless tobacco: Never Used  Substance Use Topics  . Alcohol use: Yes    Comment: rarely  . Drug use: No    Review of Systems Per HPI unless specifically indicated above     Objective:    BP 115/75   Pulse 97   Temp 98.2 F (36.8 C)   Resp 16   Ht 5' 9"  (1.753 m)   Wt (!) 341 lb (154.7 kg)   BMI 50.36 kg/m   Wt Readings from Last 3 Encounters:  06/13/17 (!) 341 lb (154.7 kg)  02/14/17 (!) 362 lb 12.8 oz (164.6 kg)  10/05/16 (!) 361 lb (163.7 kg)    Physical Exam  Constitutional: She is oriented to person, place, and time. She appears well-developed and well-nourished. No distress.  Well-appearing, discomfort with left knee pain, cooperative, morbidly obese  HENT:  Head: Normocephalic and atraumatic.  Mouth/Throat: Oropharynx is clear and moist.  Eyes: Conjunctivae are normal.  Cardiovascular: Normal rate, regular rhythm, normal heart sounds and intact distal pulses.  No murmur heard. Pulmonary/Chest: Effort normal and breath sounds normal. No respiratory distress. She has no wheezes. She has no rales.  Musculoskeletal: She exhibits edema (Stable unchanged bilateral pitting edema, symmetrical lower extremity lower  leg / ankles).  Left Knee - unchanged from last visit 01/2017 Inspection: Persistent abnormal bulky large appearance of knee with surrounding tissue, unable to determine if effusion. No erythema. Palpation: Mild to moderate +TTP Left knee only medial joint line. Mild crepitus. Also tenderness posterior L knee. Did not repeat meniscal testing today due to acute pain -  previously significant abnormal standing [place]ss[place] medially ROM: Reduced ROM flex / ext due to swelling and pain. Strength: 5/5 intact knee flex/ext, ankle dorsi/plantarflex Neurovascular: distally intact sensation light touch and pulses  Neurological: She is alert and oriented to person, place, and time.  Distal sensation intact to light touch  Skin: Skin is warm and dry. No rash noted. She is not diaphoretic. No erythema.  Posterior neck with approx 1 cm round firm cyst, mild tender, no erythema, local induration, slight pore on top  Psychiatric: She has a normal mood and affect. Her behavior is normal.  Well groomed, good eye contact, normal speech and thoughts  Nursing note and vitals reviewed.   Posterior Neck, Cyst    ________________________________________________________ PROCEDURE NOTE Date: 06/13/17 Left Knee steroid injection Discussed benefits and risks (including pain, bleeding, infection, steroid flare). Verbal consent given by patient. Medication:  1 cc Depo-medrol 31m and 4 cc Lidocaine 1% without epi Time Out taken  Landmarks identified. Area cleansed with alcohol wipes.Using 21 gauge and 1, 1/2 inch needle, Left knee joint space was injected (with above listed medication) via medial approach. Cold spray used for superficial anesthetic.Sterile bandage placed.Patient tolerated procedure well without bleeding or paresthesias.No complications.  _____________________________________________________________________ PROCEDURE NOTE Date: 06/13/17 Epidermal Cyst of Neck Removal Discussed benefits and risks (including pain, bleeding, infection). Medication:  2 cc Lidocaine 2% with epi Verbal consent given by patient. Time out taken. Area cleaned with alcohol swabs. Area prepped with betadine swabs x 2. Local anesthesia with lidocaine 2% with epi 2cc injected into site and surrounding tissue Scalpel 11 blade used to make approx 1 cm superficial incision along center of  mass, immediate drainage of thicker sebaceous material expressed with non intact cyst visualized. Sterile pick-ups used to extract the remnants of the cyst including firmer capsule material. Too shallow for packing gauze, used a sterile non stick Vaseline gauze superficially. Topical antibiotic ointment used. No complications. EBL < 1 cc. The patient tolerated the procedure well. Band-aid was applied.    Results for orders placed or performed in visit on 07/06/16  COMPLETE METABOLIC PANEL WITH GFR  Result Value Ref Range   Sodium 138 135 - 146 mmol/L   Potassium 4.6 3.5 - 5.3 mmol/L   Chloride 101 98 - 110 mmol/L   CO2 25 20 - 31 mmol/L   Glucose, Bld 89 65 - 99 mg/dL   BUN 13 7 - 25 mg/dL   Creat 0.90 0.50 - 1.10 mg/dL   Total Bilirubin 0.4 0.2 - 1.2 mg/dL   Alkaline Phosphatase 65 33 - 115 U/L   AST 14 10 - 30 U/L   ALT 20 6 - 29 U/L   Total Protein 6.7 6.1 - 8.1 g/dL   Albumin 4.0 3.6 - 5.1 g/dL   Calcium 9.0 8.6 - 10.2 mg/dL   GFR, Est African American >89 >=60 mL/min   GFR, Est Non African American 79 >=60 mL/min  Sedimentation rate  Result Value Ref Range   Sed Rate 11 0 - 20 mm/hr  TSH  Result Value Ref Range   TSH 0.58 mIU/L  C-reactive protein  Result Value Ref Range  CRP 6.2 <8.0 mg/L  Rheumatoid factor  Result Value Ref Range   Rhuematoid fact SerPl-aCnc <69 <62 IU/mL  Cyclic citrul peptide antibody, IgG  Result Value Ref Range   Cyclic Citrullin Peptide Ab <16 Units  VITAMIN D 25 Hydroxy (Vit-D Deficiency, Fractures)  Result Value Ref Range   Vit D, 25-Hydroxy 33 30 - 100 ng/mL      Assessment & Plan:   Problem List Items Addressed This Visit    Bilateral lower extremity edema    Persistent chronic bilateral LE edema, complicated by limited mobility and L knee injury. Still most consistent with chronic venous stasis and immobility as primary causes  Plan: 1. Continue RICE therapy as needed, ACE wrap, elevation 2. May continue Lasix 13m daily vs BID up  to 7 days per flare (refilled today) - can repeat if needed, only if symptomatic significant worsening swelling - caution to avoid overuse and to remain hydrated, avoid kidney injury 3. Follow-up as needed, treat underlying Knee and back pain      Relevant Medications   furosemide (LASIX) 20 MG tablet   Epidermal cyst of neck    Stable to slightly enlarged approx 1 cm cyst consistent with sebaceous cyst No evidence of secondary infection No spontaneous drainage with conservative therapy  Plan: 1. S/p I&D today, not able to remove entire cyst capsule intact, but with debridement majority of capsule was removed including sebaceous material, see procedure note for details 2. Routine wound care, vaseline / antibiotic ointment BID for 1-2 week until healed 3. Follow-up as needed - if recurrence would recommend gen surgery      Relevant Medications   lidocaine-EPINEPHrine (XYLOCAINE W/EPI) 2 %-1:100000 (with pres) injection 2 mL (Completed)   Left medial knee pain - Primary    Recurrent worsening again now approx 4 months after last L knee steroid injection (01/2017), with chronic >2 yr+ Left medial and generalized knee pain with recurrent swelling, instability and mechanical symptoms. Still concern remains for possible left medial meniscal injury. Complicated by factor of morbid obesity. - Significant barrier to care with no insurance / limited financial, [place] charity care [place]. Left knee steroid injection (#4) performed today, see procedure note 2. Continue RICE therapy, activity modification, Flexeril, Meloxicam 3. Advised her to contact [facility] to follow-up on her charity care application - now awaiting financial update, she has declined other local Ortho offices due to lack of coverage self pay - likely needs MRI and possible surgery / arthroscopy  4. Follow-up as needed - consider repeat injection q 3-6 months if helping, ideally needs more definitive Ortho  management      Relevant Medications   methylPREDNISolone acetate (DEPO-MEDROL) injection 40 mg (Completed)   lidocaine (PF) (XYLOCAINE) 1 % injection 4 mL (Completed)      Meds ordered this encounter  Medications  . furosemide (LASIX) 20 MG tablet    Sig: 1 DAILY AS NEEDED FOR SWELLING MAY INCREASE TO 2 TABLETS AS NEEDED FOR UP TO 7 DAYS MAX PER FLARE    Dispense:  30 tablet    Refill:  2  . methylPREDNISolone acetate (DEPO-MEDROL) injection 40 mg  . lidocaine (PF) (XYLOCAINE) 1 % injection 4 mL  . lidocaine-EPINEPHrine (XYLOCAINE W/EPI) 2 %-1:100000 (with pres) injection 2 mL   Follow up plan: Return in about 3 months (around 09/13/2017) for Annual Physical.  Future labs ordered for February 2019.  ANobie Putnam [facility]  06/13/2017, 1:24 PM

## 2017-06-13 NOTE — Assessment & Plan Note (Signed)
Persistent chronic bilateral LE edema, complicated by limited mobility and L knee injury. Still most consistent with chronic venous stasis and immobility as primary causes  Plan: 1. Continue RICE therapy as needed, ACE wrap, elevation 2. May continue Lasix 32m daily vs BID up to 7 days per flare (refilled today) - can repeat if needed, only if symptomatic significant worsening swelling - caution to avoid overuse and to remain hydrated, avoid kidney injury 3. Follow-up as needed, treat underlying Knee and back pain

## 2017-06-13 NOTE — Assessment & Plan Note (Signed)
Stable to slightly enlarged approx 1 cm cyst consistent with sebaceous cyst No evidence of secondary infection No spontaneous drainage with conservative therapy  Plan: 1. S/p I&D today, not able to remove entire cyst capsule intact, but with debridement majority of capsule was removed including sebaceous material, see procedure note for details 2. Routine wound care, vaseline / antibiotic ointment BID for 1-2 week until healed 3. Follow-up as needed - if recurrence would recommend gen surgery

## 2017-08-03 ENCOUNTER — Other Ambulatory Visit: Payer: Self-pay

## 2017-08-03 DIAGNOSIS — M5441 Lumbago with sciatica, right side: Principal | ICD-10-CM

## 2017-08-03 DIAGNOSIS — M25562 Pain in left knee: Secondary | ICD-10-CM

## 2017-08-03 DIAGNOSIS — G8929 Other chronic pain: Secondary | ICD-10-CM

## 2017-08-03 MED ORDER — MELOXICAM 15 MG PO TABS: 15.0000 mg | ORAL_TABLET | Freq: Every day | ORAL | 0 refills | Status: DC

## 2017-08-08 ENCOUNTER — Other Ambulatory Visit: Payer: Self-pay | Admitting: Family Medicine

## 2017-08-08 ENCOUNTER — Encounter: Payer: Self-pay | Admitting: Family Medicine

## 2017-08-08 DIAGNOSIS — M5441 Lumbago with sciatica, right side: Principal | ICD-10-CM

## 2017-08-08 DIAGNOSIS — G8929 Other chronic pain: Secondary | ICD-10-CM

## 2017-09-11 ENCOUNTER — Other Ambulatory Visit: Payer: Self-pay

## 2017-09-11 DIAGNOSIS — Z79899 Other long term (current) drug therapy: Secondary | ICD-10-CM

## 2017-09-11 DIAGNOSIS — E782 Mixed hyperlipidemia: Secondary | ICD-10-CM

## 2017-09-11 DIAGNOSIS — F314 Bipolar disorder, current episode depressed, severe, without psychotic features: Secondary | ICD-10-CM

## 2017-09-11 DIAGNOSIS — Z6841 Body Mass Index (BMI) 40.0 and over, adult: Secondary | ICD-10-CM

## 2017-09-11 DIAGNOSIS — Z0000 Encounter for general adult medical examination without abnormal findings: Secondary | ICD-10-CM

## 2017-09-11 DIAGNOSIS — R7309 Other abnormal glucose: Secondary | ICD-10-CM

## 2017-09-12 ENCOUNTER — Other Ambulatory Visit: Payer: Self-pay

## 2017-09-13 LAB — COMPLETE METABOLIC PANEL WITH GFR
AG Ratio: 1.6 (calc) (ref 1.0–2.5)
ALBUMIN MSPROF: 4.3 g/dL (ref 3.6–5.1)
ALT: 23 U/L (ref 6–29)
AST: 17 U/L (ref 10–30)
Alkaline phosphatase (APISO): 63 U/L (ref 33–115)
BILIRUBIN TOTAL: 0.4 mg/dL (ref 0.2–1.2)
BUN: 15 mg/dL (ref 7–25)
CO2: 26 mmol/L (ref 20–32)
CREATININE: 0.84 mg/dL (ref 0.50–1.10)
Calcium: 9.1 mg/dL (ref 8.6–10.2)
Chloride: 103 mmol/L (ref 98–110)
GFR, EST AFRICAN AMERICAN: 99 mL/min/{1.73_m2} (ref >=60)
GFR, Est Non African American: 85 mL/min/{1.73_m2} (ref >=60)
GLUCOSE: 102 mg/dL — AB (ref 65–99)
Globulin: 2.7 g/dL (calc) (ref 1.9–3.7)
Potassium: 4.2 mmol/L (ref 3.5–5.3)
Sodium: 138 mmol/L (ref 135–146)
TOTAL PROTEIN: 7 g/dL (ref 6.1–8.1)

## 2017-09-13 LAB — CBC WITH DIFFERENTIAL/PLATELET
BASOS PCT: 0.8 %
Basophils Absolute: 53 cells/uL (ref 0–200)
EOS ABS: 59 {cells}/uL (ref 15–500)
Eosinophils Relative: 0.9 %
HEMATOCRIT: 41.5 % (ref 35.0–45.0)
HEMOGLOBIN: 13.8 g/dL (ref 11.7–15.5)
LYMPHS ABS: 3584 {cells}/uL (ref 850–3900)
MCH: 28.3 pg (ref 27.0–33.0)
MCHC: 33.3 g/dL (ref 32.0–36.0)
MCV: 85.2 fL (ref 80.0–100.0)
MONOS PCT: 5.5 %
MPV: 10.6 fL (ref 7.5–12.5)
NEUTROS ABS: 2541 {cells}/uL (ref 1500–7800)
Neutrophils Relative %: 38.5 %
Platelets: 305 10*3/uL (ref 140–400)
RBC: 4.87 10*6/uL (ref 3.80–5.10)
RDW: 12.8 % (ref 11.0–15.0)
Total Lymphocyte: 54.3 %
WBC mixed population: 363 cells/uL (ref 200–950)
WBC: 6.6 10*3/uL (ref 3.8–10.8)

## 2017-09-13 LAB — LIPID PANEL
CHOL/HDL RATIO: 4.1 (calc) (ref <5.0)
CHOLESTEROL: 176 mg/dL (ref <200)
HDL: 43 mg/dL — ABNORMAL LOW (ref >50)
LDL Cholesterol (Calc): 105 mg/dL (calc) — ABNORMAL HIGH
Non-HDL Cholesterol (Calc): 133 mg/dL (calc) — ABNORMAL HIGH (ref <130)
Triglycerides: 168 mg/dL — ABNORMAL HIGH (ref <150)

## 2017-09-13 LAB — HEMOGLOBIN A1C
EAG (MMOL/L): 6 (calc)
Hgb A1c MFr Bld: 5.4 % of total Hgb (ref <5.7)
Mean Plasma Glucose: 108 (calc)

## 2017-09-19 ENCOUNTER — Encounter: Payer: Self-pay | Admitting: Family Medicine

## 2017-10-03 ENCOUNTER — Encounter: Payer: Self-pay | Admitting: Family Medicine

## 2017-10-03 ENCOUNTER — Ambulatory Visit: Payer: Self-pay | Admitting: Family Medicine

## 2017-10-03 ENCOUNTER — Other Ambulatory Visit: Payer: Self-pay

## 2017-10-03 VITALS — BP 126/83 | HR 115 | Temp 97.7°F | Ht 69.0 in | Wt 350.0 lb

## 2017-10-03 DIAGNOSIS — M25562 Pain in left knee: Secondary | ICD-10-CM

## 2017-10-03 DIAGNOSIS — M5441 Lumbago with sciatica, right side: Secondary | ICD-10-CM

## 2017-10-03 DIAGNOSIS — G8929 Other chronic pain: Secondary | ICD-10-CM

## 2017-10-03 MED ORDER — LIDOCAINE HCL (PF) 1 % IJ SOLN
4.0000 mL | Freq: Once | INTRAMUSCULAR | Status: AC
  Administered 2017-10-03: 4 mL

## 2017-10-03 MED ORDER — METHYLPREDNISOLONE ACETATE 40 MG/ML IJ SUSP
40.0000 mg | Freq: Once | INTRAMUSCULAR | Status: AC
  Administered 2017-10-03: 40 mg via INTRA_ARTICULAR

## 2017-10-03 NOTE — Patient Instructions (Addendum)
Thank you for coming to the office today.  1.  You received a Left Knee Joint steroid injection today. - Lidocaine numbing medicine may ease the pain initially for a few hours until it wears off - As discussed, you may experience a "steroid flare" this evening or within 24-48 hours, anytime medicine is injected into an inflamed joint it can cause the pain to get worse temporarily - Everyone responds differently to these injections, it depends on the patient and the severity of the joint problem, it may provide anywhere from days to weeks, to months of relief. Ideal response is >6 months relief - Try to take it easy for next 1-2 days, avoid over activity and strain on joint (limit walking for knee) - Recommend the following:   - For swelling - rest, compression sleeve / ACE wrap, elevation, and ice packs as needed for first few days   - For pain in future may use heating pad or moist heat as needed  Injection # 5 - goal to have you seen by Ortho within 2019  ------------------------------------------------------  Please check out [facility] options - may contact the customer service #  [email or web address]  [place] [phone] (toll-free) [phone] [facility]) Billing Dept. [place], [place] [address]  How to Apply To apply for financial assistance: Complete and submit a Financial Assistance application in [place] or [place].  Include a copy of the [place] Tax Return and current W-2 forms for all working members of your household.  If you receive Social Security benefits, submit a copy of your Social Security benefit statement.  Include supporting documentation for any other income you receive.  Incomplete applications will not be considered and will be returned for missing documentation.  Return the  completed application with supporting documentation to the [employer] at any hospital campus ([place], [place], [place], [place] or [place]) or mail to:  [place], [place] [address] If you have any questions about the application, please call financial counseling [phone].   Please schedule a Follow-up Appointment to: Return in about 4 weeks (around 10/31/2017) for Annual Physical (re-scheduled, alrdy has labs done).  If you have any other questions or concerns, please feel free to call the office or send a message through [place]. You may also schedule an earlier appointment if necessary.  Additionally, you may be receiving a survey about your experience at our office within a few days to 1 week by e-mail or mail. We value your feedback.  [name], DO [place]

## 2017-10-03 NOTE — Progress Notes (Signed)
Subjective:    Patient ID: Maria Jacobson, female    DOB: March 20, 1976, 42 y.o.   MRN: [id number]  Maria Jacobson is a 42 y.o. female presenting on 10/03/2017 for Knee Pain (Needs a Cortisone shot)   HPI   FOLLOW-UP LEFT KNEE, CHRONIC PAINwith concern for L torn meniscus/ Bilateral leg swelling: - Last visit with 06/13/17, for same problem, treated withL knee steroid injection (#4), continued Flexeril, Meloxicam, and re-submitted [facility] Application for Orthopedics, see prior notes for background information. - Interval update with some limited improvement on last steroid injection, took >1 week to take effect compared to previous injections, and did not last full 3 months - Her application to [facility] again had problems and they did not have record of it - Today patient reportssignificant worsening L knee pain,seems to be now more similar to when knee pain first began back in 2017, having some more difficulty with ambulation and some knee instability and ambulation, difficulty with climbing stairs - Still taking medicines, regularly Flexeril 42m TID, Meloxicam 142m Tylenol, using topical cream for relief - Regarding LE swelling, still persistent but seems to be interval improved on lasix, only using with prolonged travel, also using ACE wrap - [place] - Denies fall or new injury, worsening swelling, leg erythema  Additional updates: - She had history of recent Influenza infection had to miss annual physical but will re-schedule, she had labs drawn.  Depression screen PHQ 2/9 10/03/2017  Decreased Interest 2  Down, Depressed, Hopeless 3  PHQ - 2 Score 5  Altered sleeping 2  Tired, decreased energy 3  Change in appetite 2  Feeling bad or failure about yourself  3  Trouble concentrating 2  Moving slowly or fidgety/restless 0  Suicidal thoughts 2  PHQ-9 Score 19  Difficult doing work/chores Not difficult at all      Social History   Tobacco Use  . Smoking status: Never Smoker  . Smokeless tobacco: Never Used  Substance Use Topics  . Alcohol use: Yes    Comment: rarely  . Drug use: No    Review of Systems Per HPI unless specifically indicated above     Objective:    BP 126/83 (BP Location: Right Arm, Patient Position: Sitting, Cuff Size: Large)   Pulse (!) 115   Temp 97.7 F (36.5 C)   Ht 5' 9"  (1.753 m)   Wt (!) 350 lb (158.8 kg)   BMI 51.69 kg/m   Wt Readings from Last 3 Encounters:  10/03/17 (!) 350 lb (158.8 kg)  06/13/17 (!) 341 lb (154.7 kg)  02/14/17 (!) 362 lb 12.8 oz (164.6 kg)    Physical Exam  Constitutional: She is oriented to person, place, and time. She appears well-developed and well-nourished. No distress.  Well-appearing, discomfort with left knee pain, cooperative, morbidly obese  HENT:  Head: Normocephalic and atraumatic.  Mouth/Throat: Oropharynx is clear and moist.  Eyes: Conjunctivae are normal.  Cardiovascular: Normal rate, regular rhythm, normal heart sounds and intact distal pulses.  No murmur heard. Pulmonary/Chest: Effort normal and breath sounds normal. No respiratory distress. She has no wheezes. She has no rales.  Musculoskeletal: She exhibits edema (Stable bilateral symmetrical lower extremity lower leg / ankles, without worsening or change, no erythema).  Left Knee - stable Inspection: Persistent abnormal bulky large appearance of knee with surrounding tissue, unable to determine if effusion. No erythema. Palpation: Mild to moderate +TTP Left  knee only medial joint line with some palpable soft tissue edema, lateral side non tender. Mild crepitus. Also tenderness posterior L knee. ROM: Reduced ROM flex / ext due to swelling and pain. Strength: 5/5 intact knee flex/ext, ankle dorsi/plantarflex Neurovascular: distally intact sensation light touch and pulses  Neurological: She is alert and oriented to person, place, and time.  Distal sensation  intact to light touch  Skin: Skin is warm and dry. No rash noted. She is not diaphoretic. No erythema.  Posterior neck with approx 1 cm round firm cyst, mild tender, no erythema, local induration, slight pore on top  Psychiatric: She has a normal mood and affect. Her behavior is normal.  Well groomed, good eye contact, normal speech and thoughts  Nursing note and vitals reviewed.   ________________________________________________________ PROCEDURE NOTE Date: 10/03/17 Left Knee Steroid injection Discussed benefits and risks (including pain, bleeding, infection, steroid flare). Verbal consent given by patient. Medication:  1 cc Depo-medrol 44m and 4 cc Lidocaine 1% without epi Time Out taken  Landmarks identified. Area cleansed with alcohol wipes.Using 21 gauge and 1, 1/2 inch needle, Left knee joint was injected (with above listed medication) via lateral approach due to some soft tissue swelling localized over R medial aspect, cold spray used for superficial anesthetic.Sterile bandage placed.Patient tolerated procedure well without bleeding or paresthesias.No complications.   Results for orders placed or performed in visit on 09/11/17  Lipid panel  Result Value Ref Range   Cholesterol 176 <200 mg/dL   HDL 43 (L) >50 mg/dL   Triglycerides 168 (H) <150 mg/dL   LDL Cholesterol (Calc) 105 (H) mg/dL (calc)   Total CHOL/HDL Ratio 4.1 <5.0 (calc)   Non-HDL Cholesterol (Calc) 133 (H) <130 mg/dL (calc)  Hemoglobin A1c  Result Value Ref Range   Hgb A1c MFr Bld 5.4 <5.7 % of total Hgb   Mean Plasma Glucose 108 (calc)   eAG (mmol/L) 6.0 (calc)  CBC with Differential/Platelet  Result Value Ref Range   WBC 6.6 3.8 - 10.8 Thousand/uL   RBC 4.87 3.80 - 5.10 Million/uL   Hemoglobin 13.8 11.7 - 15.5 g/dL   HCT 41.5 35.0 - 45.0 %   MCV 85.2 80.0 - 100.0 fL   MCH 28.3 27.0 - 33.0 pg   MCHC 33.3 32.0 - 36.0 g/dL   RDW 12.8 11.0 - 15.0 %   Platelets 305 140 - 400 Thousand/uL   MPV 10.6 7.5 -  12.5 fL   Neutro Abs 2,541 1,500 - 7,800 cells/uL   Lymphs Abs 3,584 850 - 3,900 cells/uL   WBC mixed population 363 200 - 950 cells/uL   Eosinophils Absolute 59 15 - 500 cells/uL   Basophils Absolute 53 0 - 200 cells/uL   Neutrophils Relative % 38.5 %   Total Lymphocyte 54.3 %   Monocytes Relative 5.5 %   Eosinophils Relative 0.9 %   Basophils Relative 0.8 %  COMPLETE METABOLIC PANEL WITH GFR  Result Value Ref Range   Glucose, Bld 102 (H) 65 - 99 mg/dL   BUN 15 7 - 25 mg/dL   Creat 0.84 0.50 - 1.10 mg/dL   GFR, Est Non African American 85 > OR = 60 mL/min/1.782m  GFR, Est African American 99 > OR = 60 mL/min/1.7346m BUN/Creatinine Ratio NOT APPLICABLE 6 - 22 (calc)   Sodium 138 135 - 146 mmol/L   Potassium 4.2 3.5 - 5.3 mmol/L   Chloride 103 98 - 110 mmol/L   CO2 26 20 - 32 mmol/L  Calcium 9.1 8.6 - 10.2 mg/dL   Total Protein 7.0 6.1 - 8.1 g/dL   Albumin 4.3 3.6 - 5.1 g/dL   Globulin 2.7 1.9 - 3.7 g/dL (calc)   AG Ratio 1.6 1.0 - 2.5 (calc)   Total Bilirubin 0.4 0.2 - 1.2 mg/dL   Alkaline phosphatase (APISO) 63 33 - 115 U/L   AST 17 10 - 30 U/L   ALT 23 6 - 29 U/L      Assessment & Plan:   Problem List Items Addressed This Visit    Chronic bilateral low back pain with right-sided sciatica    Additional complaint with back pain, seems unchanged Improved in interval on current medicines Remains off Gabapentin Still waiting on [facility]  See A&P for knee on medicines Will now try to arrange [facility] Application to [facility] since unable to get arranged through [facility]      Relevant Medications   methylPREDNISolone acetate (DEPO-MEDROL) injection 40 mg (Completed)   Left medial knee pain - Primary    Again recurrent worsening again now approx >3 months after last L knee steroid injection (06/2017), s/p #4 injections - Known chronic >2 yr+ Left medial and generalized knee pain with recurrent swelling, instability and mechanical symptoms. Still concern  remains for possible left medial meniscal injury. Complicated by factor of morbid obesity. - Significant barrier to care with no insurance / limited financial - Unable to get established with [facility] Orth[facility] through [facility], she cannot afford care otherwise and has tried other ortho offices locally, they have not been able to process or locate or receive her application on 3-4 attempts  Plan: 1. Left knee steroid injection (#5) performed today, see procedure note 2. Continue RICE therapy, activity modification, Flexeril, Meloxicam 3. Will now try to arrange [facility] Application to [facility] since unable to get arranged through [facility] - discussed that there seems to be some problem with her applications to [facility], regardless, we need to get her established with Ortho as next option, as we have discussed future long term limitations of steroid injections, now s/p 5 injections we are approaching concern of using too many, advised that I would be okay to proceed through this year 2019 but goal is to get her established with ortho within 6-9 months - Lastly we may have to consider transfer her care to a larger academic center regardless and they can be more connected with charity care resources, [facility] is most convenient for her w/ psychiatry and other travel - Again she will need MRI and further intervention in future most likely      Relevant Medications   lidocaine (PF) (XYLOCAINE) 1 % injection 4 mL (Completed)   methylPREDNISolone acetate (DEPO-MEDROL) injection 40 mg (Completed)      Meds ordered this encounter  Medications  . lidocaine (PF) (XYLOCAINE) 1 % injection 4 mL  . methylPREDNISolone acetate (DEPO-MEDROL) injection 40 mg   Additionally - she has already been given DMV handicap placard in 07/2017, she has not submitted this yet, but if needs new form we can do this just needs to contact office   Follow up plan: Return in about 4 weeks (around 10/31/2017) for Annual  Physical (re-scheduled, alrdy has labs done).  Will re-schedule annual physical, already had labs done.  [name], [facility] 10/03/2017, 12:44 PM

## 2017-10-03 NOTE — Assessment & Plan Note (Signed)
Additional complaint with back pain, seems unchanged Improved in interval on current medicines Remains off Gabapentin Still waiting on [facility]  See A&P for knee on medicines Will now try to arrange [facility] Application to [facility] since unable to get arranged through [facility]

## 2017-10-03 NOTE — Assessment & Plan Note (Signed)
Again recurrent worsening again now approx >3 months after last L knee steroid injection (06/2017), s/p #4 injections - Known chronic >2 yr+ Left medial and generalized knee pain with recurrent swelling, instability and mechanical symptoms. Still concern remains for possible left medial meniscal injury. Complicated by factor of morbid obesity. - Significant barrier to care with no insurance / limited financial - Unable to get established with [facility] through [facility], she cannot afford care otherwise and has tried other ortho offices locally, they have not been able to process or locate or receive her application on 3-4 attempts  Plan: 1. Left knee steroid injection (#5) performed today, see procedure note 2. Continue RICE therapy, activity modification, Flexeril, Meloxicam 3. Will now try to arrange [facility] Application to [facility] since unable to get arranged through [facility] - discussed that there seems to be some problem with her applications to [facility], regardless, we need to get her established with Ortho as next option, as we have discussed future long term limitations of steroid injections, now s/p 5 injections we are approaching concern of using too many, advised that I would be okay to proceed through this year 2019 but goal is to get her established with ortho within 6-9 months - Lastly we may have to consider transfer her care to a larger academic center regardless and they can be more connected with charity care resources, [facility] is most convenient for her w/ psychiatry and other travel - Again she will need MRI and further intervention in future most likely

## 2017-10-10 ENCOUNTER — Other Ambulatory Visit: Payer: Self-pay

## 2017-11-02 ENCOUNTER — Other Ambulatory Visit: Payer: Self-pay | Admitting: Family Medicine

## 2017-11-02 DIAGNOSIS — M5441 Lumbago with sciatica, right side: Principal | ICD-10-CM

## 2017-11-02 DIAGNOSIS — G8929 Other chronic pain: Secondary | ICD-10-CM

## 2017-11-02 DIAGNOSIS — M25562 Pain in left knee: Secondary | ICD-10-CM

## 2017-11-15 ENCOUNTER — Encounter: Payer: Self-pay | Admitting: Family Medicine

## 2017-11-15 DIAGNOSIS — M5441 Lumbago with sciatica, right side: Principal | ICD-10-CM

## 2017-11-15 DIAGNOSIS — G8929 Other chronic pain: Secondary | ICD-10-CM

## 2017-11-15 MED ORDER — PREDNISONE 20 MG PO TABS: ORAL_TABLET | ORAL | 0 refills | Status: DC

## 2017-11-25 ENCOUNTER — Other Ambulatory Visit: Payer: Self-pay | Admitting: Family Medicine

## 2017-11-25 DIAGNOSIS — M5441 Lumbago with sciatica, right side: Principal | ICD-10-CM

## 2017-11-25 DIAGNOSIS — G8929 Other chronic pain: Secondary | ICD-10-CM

## 2017-12-19 ENCOUNTER — Other Ambulatory Visit: Payer: Self-pay | Admitting: Family Medicine

## 2017-12-19 DIAGNOSIS — R600 Localized edema: Secondary | ICD-10-CM

## 2017-12-28 ENCOUNTER — Encounter: Payer: Self-pay | Admitting: Family Medicine

## 2017-12-28 DIAGNOSIS — R059 Cough, unspecified: Secondary | ICD-10-CM

## 2017-12-28 DIAGNOSIS — R05 Cough: Secondary | ICD-10-CM

## 2017-12-28 MED ORDER — PSEUDOEPH-BROMPHEN-DM 30-2-10 MG/5ML PO SYRP: 5.0000 mL | ORAL_SOLUTION | Freq: Four times a day (QID) | ORAL | 0 refills | Status: DC | PRN

## 2017-12-28 MED ORDER — BENZONATATE 100 MG PO CAPS: 100.0000 mg | ORAL_CAPSULE | Freq: Three times a day (TID) | ORAL | 0 refills | Status: DC | PRN

## 2018-02-04 ENCOUNTER — Other Ambulatory Visit: Payer: Self-pay | Admitting: Family Medicine

## 2018-02-04 DIAGNOSIS — M25562 Pain in left knee: Secondary | ICD-10-CM

## 2018-02-04 DIAGNOSIS — M5441 Lumbago with sciatica, right side: Principal | ICD-10-CM

## 2018-02-04 DIAGNOSIS — G8929 Other chronic pain: Secondary | ICD-10-CM

## 2018-03-22 ENCOUNTER — Other Ambulatory Visit: Payer: Self-pay | Admitting: Family Medicine

## 2018-03-22 DIAGNOSIS — M5441 Lumbago with sciatica, right side: Principal | ICD-10-CM

## 2018-03-22 DIAGNOSIS — G8929 Other chronic pain: Secondary | ICD-10-CM

## 2018-05-05 ENCOUNTER — Other Ambulatory Visit: Payer: Self-pay | Admitting: Family Medicine

## 2018-05-05 DIAGNOSIS — M25562 Pain in left knee: Secondary | ICD-10-CM

## 2018-05-05 DIAGNOSIS — G8929 Other chronic pain: Secondary | ICD-10-CM

## 2018-05-05 DIAGNOSIS — M5441 Lumbago with sciatica, right side: Principal | ICD-10-CM

## 2018-05-10 ENCOUNTER — Ambulatory Visit (INDEPENDENT_AMBULATORY_CARE_PROVIDER_SITE_OTHER): Payer: Self-pay | Admitting: Family Medicine

## 2018-05-10 ENCOUNTER — Other Ambulatory Visit: Payer: Self-pay | Admitting: Family Medicine

## 2018-05-10 ENCOUNTER — Encounter: Payer: Self-pay | Admitting: Family Medicine

## 2018-05-10 VITALS — BP 139/96 | HR 101 | Temp 98.6°F | Resp 16 | Ht 69.0 in | Wt 365.0 lb

## 2018-05-10 DIAGNOSIS — F314 Bipolar disorder, current episode depressed, severe, without psychotic features: Secondary | ICD-10-CM

## 2018-05-10 DIAGNOSIS — M5441 Lumbago with sciatica, right side: Secondary | ICD-10-CM

## 2018-05-10 DIAGNOSIS — G8929 Other chronic pain: Secondary | ICD-10-CM

## 2018-05-10 DIAGNOSIS — R600 Localized edema: Secondary | ICD-10-CM

## 2018-05-10 DIAGNOSIS — R7309 Other abnormal glucose: Secondary | ICD-10-CM

## 2018-05-10 DIAGNOSIS — E782 Mixed hyperlipidemia: Secondary | ICD-10-CM

## 2018-05-10 DIAGNOSIS — M25562 Pain in left knee: Secondary | ICD-10-CM

## 2018-05-10 DIAGNOSIS — Z6841 Body Mass Index (BMI) 40.0 and over, adult: Secondary | ICD-10-CM

## 2018-05-10 MED ORDER — LIDOCAINE HCL (PF) 1 % IJ SOLN
4.0000 mL | Freq: Once | INTRAMUSCULAR | Status: AC
Start: 2018-05-10 — End: 2018-05-10
  Administered 2018-05-10: 4 mL

## 2018-05-10 MED ORDER — PREDNISONE 20 MG PO TABS: ORAL_TABLET | ORAL | 0 refills | Status: DC

## 2018-05-10 MED ORDER — TRIAMCINOLONE ACETONIDE 40 MG/ML IJ SUSP: 40.0000 mg | Freq: Once | INTRAMUSCULAR | Status: DC

## 2018-05-10 MED ORDER — METHYLPREDNISOLONE ACETATE 40 MG/ML IJ SUSP
40.0000 mg | Freq: Once | INTRAMUSCULAR | Status: AC
  Administered 2018-05-10: 40 mg via INTRA_ARTICULAR

## 2018-05-10 NOTE — Patient Instructions (Addendum)
Thank you for coming to the office today.  You received a Left Knee Joint steroid injection today. - Lidocaine numbing medicine may ease the pain initially for a few hours until it wears off - As discussed, you may experience a "steroid flare" this evening or within 24-48 hours, anytime medicine is injected into an inflamed joint it can cause the pain to get worse temporarily - Everyone responds differently to these injections, it depends on the patient and the severity of the joint problem, it may provide anywhere from days to weeks, to months of relief. Ideal response is >6 months relief - Try to take it easy for next 1-2 days, avoid over activity and strain on joint (limit walking for knee) - Recommend the following:   - For swelling - rest, compression sleeve / ACE wrap, elevation, and ice packs as needed for first few days   - For pain in future may use heating pad or moist heat as needed  Medication  Start Prednisone taper as well over next 7 days  With regards to future Orthopedics - recommend investigating [facility] - to see if they can help work on establishing care to get closer to a [facility] Application Completion  If needed can check back with [facility] for FASTING BLOOD WORK (no food or drink after midnight before the lab appointment, only water or coffee without cream/sugar on the morning of)  SCHEDULE "Lab Only" visit in the morning at the clinic for lab draw in 6 MONTHS   - Make sure Lab Only appointment is at about 1 week before your next appointment, so that results will be available  For Lab Results, once available within 2-3 days of blood draw, you can can log in to MyChart online to view your results and a brief explanation. Also, we can discuss results at next follow-up visit.  Please schedule a Follow-up Appointment to: Return in about 6 months (around 11/09/2018) for [employer].  If you have any  other questions or concerns, please feel free to call the office or send a message through [place]. You may also schedule an earlier appointment if necessary.  Additionally, you may be receiving a survey about your experience at our office within a few days to 1 week by e-mail or mail. We value your feedback.  [name], DO [place]

## 2018-05-10 NOTE — Progress Notes (Signed)
Subjective:    Patient ID: Maria Jacobson, female    DOB: 24-Jul-1976, 42 y.o.   MRN: [id number]  Maria Jacobson is a 42 y.o. female presenting on 05/10/2018 for Knee Pain (left)   HPI   FOLLOW-UP LEFT KNEE, CHRONIC PAIN SCIATICA / BACK PAIN / Bilateral leg swelling: - Last visit with 09/2017, for same problem, treated withL knee steroid injection (#5), continued Flexeril, Meloxicam, and re-submitted [place] for Orthopedics, also unsuccessful w/ charity care application through [facility], see prior notes for background information. - Interval update with improvement still on steroid injection, but not lasting >3 months, she dealt with pain in interval, unable to proceed with [facility] at this time by her report and difficulty due to work and travel - Today still has persistent L knee and Low back pain with sciatica symptoms radiating down legs episodic, limited in ambulation and prolong standing, improved if seated or resting, has required mobility scooter to get around at times at work. - Still taking medicines, regularly Flexeril42m TID, Meloxicam187m Tylenol, using topical cream for relief - Regarding LE swelling, still persistent but seems to be interval improved on lasix, only using with prolonged travel, also using ACE wrap Improved on prednisone before for sciatica - Denies fall or new injury, worsening swelling, leg erythema  Morbid obesity BMI >53 Persistent worsening weight gain limited by joint pain, unable to regularly exercise.  Bipolar Affective Mood Disorder Followed by Psychiatry/Therapy currently, on med management. Recent worsening flare up of depression during summer recently, partly due to physical limitations On higher dose lamictal now, seems to be doing better.   Health Maintenance: Due for Flu Shot, declines today despite counseling on benefits   Depression screen PHQ 2/9 10/03/2017  Decreased Interest 2  Down, Depressed, Hopeless 3  PHQ -  2 Score 5  Altered sleeping 2  Tired, decreased energy 3  Change in appetite 2  Feeling bad or failure about yourself  3  Trouble concentrating 2  Moving slowly or fidgety/restless 0  Suicidal thoughts 2  PHQ-9 Score 19  Difficult doing work/chores Not difficult at all    Social History   Tobacco Use  . Smoking status: Never Smoker  . Smokeless tobacco: Never Used  Substance Use Topics  . Alcohol use: Yes    Comment: rarely  . Drug use: No    Review of Systems Per HPI unless specifically indicated above     Objective:    BP (!) 139/96   Pulse (!) 101   Temp 98.6 F (37 C) (Oral)   Resp 16   Ht 5' 9"  (1.753 m)   Wt (!) 365 lb (165.6 kg)   BMI 53.90 kg/m   Wt Readings from Last 3 Encounters:  05/10/18 (!) 365 lb (165.6 kg)  10/03/17 (!) 350 lb (158.8 kg)  06/13/17 (!) 341 lb (154.7 kg)    Physical Exam  Constitutional: She is oriented to person, place, and time. She appears well-developed and well-nourished. No distress.  Well-appearing, discomfort with left knee pain, cooperative, morbidly obese  HENT:  Head: Normocephalic and atraumatic.  Mouth/Throat: Oropharynx is clear and moist.  Eyes: Conjunctivae are normal.  Cardiovascular: Normal rate, regular rhythm, normal heart sounds and intact distal pulses.  No murmur heard. Pulmonary/Chest: Effort normal and breath sounds normal. No respiratory distress. She has no wheezes. She has no rales.  Musculoskeletal: She exhibits edema (Stable bilateral symmetrical lower extremity lower leg / ankles, without worsening or change, no erythema).  Left Knee Inspection: Persistent abnormal bulky large appearance of knee with surrounding tissue, unable to determine if effusion. No erythema. Palpation: Mild to moderate +TTP Left knee only medial joint line with some palpable soft tissue edema, lateral side non tender. Mild crepitus. Also tenderness posterior L knee. ROM: Reduced ROM flex / ext due to swelling and  pain. Strength: 5/5 intact knee flex/ext, ankle dorsi/plantarflex Neurovascular: distally intact sensation light touch and pulses  Neurological: She is alert and oriented to person, place, and time.  Distal sensation intact to light touch  Skin: Skin is warm and dry. No rash noted. She is not diaphoretic. No erythema.  Posterior neck with approx 1 cm round firm cyst, mild tender, no erythema, local induration, slight pore on top  Psychiatric: She has a normal mood and affect. Her behavior is normal.  Well groomed, good eye contact, normal speech and thoughts  Nursing note and vitals reviewed.    ________________________________________________________ PROCEDURE NOTE Date: 05/10/18 Left Knee steroid injection Discussed benefits and risks (including pain, bleeding, infection, steroid flare). Verbal consent given by patient. Medication:  1 cc Depo-medrol 23m and 4 cc Lidocaine 1% without epi Time Out taken  Landmarks identified. Area cleansed with alcohol wipes. Using 21 gauge and 1, 1/2 inch needle, Left knee joint space was injected (with above listed medication) via medial approach cold spray used for superficial anesthetic. Sterile bandage placed. Patient tolerated procedure well without bleeding or paresthesias. No complications.   Results for orders placed or performed in visit on 09/11/17  Lipid panel  Result Value Ref Range   Cholesterol 176 <200 mg/dL   HDL 43 (L) >50 mg/dL   Triglycerides 168 (H) <150 mg/dL   LDL Cholesterol (Calc) 105 (H) mg/dL (calc)   Total CHOL/HDL Ratio 4.1 <5.0 (calc)   Non-HDL Cholesterol (Calc) 133 (H) <130 mg/dL (calc)  Hemoglobin A1c  Result Value Ref Range   Hgb A1c MFr Bld 5.4 <5.7 % of total Hgb   Mean Plasma Glucose 108 (calc)   eAG (mmol/L) 6.0 (calc)  CBC with Differential/Platelet  Result Value Ref Range   WBC 6.6 3.8 - 10.8 Thousand/uL   RBC 4.87 3.80 - 5.10 Million/uL   Hemoglobin 13.8 11.7 - 15.5 g/dL   HCT 41.5 35.0 - 45.0 %    MCV 85.2 80.0 - 100.0 fL   MCH 28.3 27.0 - 33.0 pg   MCHC 33.3 32.0 - 36.0 g/dL   RDW 12.8 11.0 - 15.0 %   Platelets 305 140 - 400 Thousand/uL   MPV 10.6 7.5 - 12.5 fL   Neutro Abs 2,541 1,500 - 7,800 cells/uL   Lymphs Abs 3,584 850 - 3,900 cells/uL   WBC mixed population 363 200 - 950 cells/uL   Eosinophils Absolute 59 15 - 500 cells/uL   Basophils Absolute 53 0 - 200 cells/uL   Neutrophils Relative % 38.5 %   Total Lymphocyte 54.3 %   Monocytes Relative 5.5 %   Eosinophils Relative 0.9 %   Basophils Relative 0.8 %  COMPLETE METABOLIC PANEL WITH GFR  Result Value Ref Range   Glucose, Bld 102 (H) 65 - 99 mg/dL   BUN 15 7 - 25 mg/dL   Creat 0.84 0.50 - 1.10 mg/dL   GFR, Est Non African American 85 > OR = 60 mL/min/1.762m  GFR, Est African American 99 > OR = 60 mL/min/1.7380m BUN/Creatinine Ratio NOT APPLICABLE 6 - 22 (calc)   Sodium 138 135 - 146 mmol/L   Potassium  4.2 3.5 - 5.3 mmol/L   Chloride 103 98 - 110 mmol/L   CO2 26 20 - 32 mmol/L   Calcium 9.1 8.6 - 10.2 mg/dL   Total Protein 7.0 6.1 - 8.1 g/dL   Albumin 4.3 3.6 - 5.1 g/dL   Globulin 2.7 1.9 - 3.7 g/dL (calc)   AG Ratio 1.6 1.0 - 2.5 (calc)   Total Bilirubin 0.4 0.2 - 1.2 mg/dL   Alkaline phosphatase (APISO) 63 33 - 115 U/L   AST 17 10 - 30 U/L   ALT 23 6 - 29 U/L      Assessment & Plan:   Problem List Items Addressed This Visit    Bipolar affective disorder, depressed, severe (HCC)    Stable, chronic problem - prior flare now improved on Lamictal Followed by Psychiatry Rondall Allegra Controlled on current med regimen per Psychiatry, no med management from our office at this time      Chronic bilateral low back pain with right-sided sciatica    Subacute on chronic low back pain flare, w/ sciatica Worsened by morbid obesity and chronic knee pain, compensating w/ ambulation due to joint pain  Plan Repeat course of Prednisone taper over 7 days, as rx, last course >1 year ago Continue NSAID, muscle  relaxant Again emphasis importance of future specialty care given limited function Follow-up      Relevant Medications   predniSONE (DELTASONE) 20 MG tablet   methylPREDNISolone acetate (DEPO-MEDROL) injection 40 mg (Completed)   Left medial knee pain - Primary    Again recurrent worsening again now approx >6-7 months after last L knee steroid injection (09/2017), s/p #5 injections - Known chronic >2-3 yr+ Left medial and generalized knee pain with recurrent swelling, instability and mechanical symptoms. Still concern remains for possible left medial meniscal injury. Complicated by factor of morbid obesity. - Significant barrier to care with no insurance / limited financial - Unable to get established w/ local ortho, or [facility] or [facility] through [facility], [facility]  Plan: 1. Left knee steroid injection (#6) performed today, see procedure note 2. Continue RICE therapy, activity modification, Flexeril, Meloxicam 3. I have recommended that she try to establish with a primary provider through [facility] or [facility] to assist her with the referral process to Ortho - Again I have very limited options going forward for her significant debilitation - She has handicap placard - Repeat prednisone, refill chronic meds meloxicam / flexeril PRN - As we have discussed future long term limitations of steroid injections, now s/p 6 injections we are approaching concern of using too many, advised that I would be okay to proceed only temporarily - she needs to transfer care to Orthopedics or higher level of care specialty in future  - Again she will need MRI and further intervention in future most likely      Relevant Medications   lidocaine (PF) (XYLOCAINE) 1 % injection 4 mL (Completed)   methylPREDNISolone acetate (DEPO-MEDROL) injection 40 mg (Completed)   Morbid obesity with BMI of 50.0-59.9, adult ([place])    Worsening weight gain Limited diet/exercise, see  A&P Future ultimately if unable to get established with ortho, will likely need further aggressive wt loss management vs bariatric         Meds ordered this encounter  Medications  . lidocaine (PF) (XYLOCAINE) 1 % injection 4 mL  . DISCONTD: triamcinolone acetonide (KENALOG-40) injection 40 mg  . predniSONE (DELTASONE) 20 MG tablet  Sig: Take daily with food. Start with 89m (3 pills) x 2 days, then reduce to 457m(2 pills) x 2 days, then 2055m1 pill) x 3 days    Dispense:  13 tablet    Refill:  0  . methylPREDNISolone acetate (DEPO-MEDROL) injection 40 mg    Follow up plan: Return in about 6 months (around 11/09/2018) for Yearly Checkup.  Future labs ordered for 11/09/18  AleNobie PutnamO [place] 05/10/2018, 10:56 PM

## 2018-05-10 NOTE — Assessment & Plan Note (Signed)
Stable, chronic problem - prior flare now improved on Lamictal Followed by Psychiatry Rondall Allegra Controlled on current med regimen per Psychiatry, no med management from our office at this time

## 2018-05-10 NOTE — Assessment & Plan Note (Signed)
Subacute on chronic low back pain flare, w/ sciatica Worsened by morbid obesity and chronic knee pain, compensating w/ ambulation due to joint pain  Plan Repeat course of Prednisone taper over 7 days, as rx, last course >1 year ago Continue NSAID, muscle relaxant Again emphasis importance of future specialty care given limited function Follow-up

## 2018-05-10 NOTE — Assessment & Plan Note (Signed)
Again recurrent worsening again now approx >6-7 months after last L knee steroid injection (09/2017), s/p #5 injections - Known chronic >2-3 yr+ Left medial and generalized knee pain with recurrent swelling, instability and mechanical symptoms. Still concern remains for possible left medial meniscal injury. Complicated by factor of morbid obesity. - Significant barrier to care with no insurance / limited financial - Unable to get established w/ local ortho, or [facility] or [facility] through [facility], has tried [facility]  Plan: 1. Left knee steroid injection (#6) performed today, see procedure note 2. Continue RICE therapy, activity modification, Flexeril, Meloxicam 3. I have recommended that she try to establish with a primary provider through [facility] or [facility] to assist her with the referral process to Ortho - Again I have very limited options going forward for her significant debilitation - She has handicap placard - Repeat prednisone, refill chronic meds meloxicam / flexeril PRN - As we have discussed future long term limitations of steroid injections, now s/p 6 injections we are approaching concern of using too many, advised that I would be okay to proceed only temporarily - she needs to transfer care to Orthopedics or higher level of care specialty in future  - Again she will need MRI and further intervention in future most likely

## 2018-05-10 NOTE — Assessment & Plan Note (Signed)
Worsening weight gain Limited diet/exercise, see A&P Future ultimately if unable to get established with ortho, will likely need further aggressive wt loss management vs bariatric

## 2018-05-18 ENCOUNTER — Ambulatory Visit: Payer: Self-pay | Admitting: Family Medicine

## 2018-06-12 DIAGNOSIS — G8929 Other chronic pain: Secondary | ICD-10-CM

## 2018-06-12 DIAGNOSIS — M5441 Lumbago with sciatica, right side: Principal | ICD-10-CM

## 2018-07-09 DIAGNOSIS — M5441 Lumbago with sciatica, right side: Principal | ICD-10-CM

## 2018-07-09 DIAGNOSIS — G8929 Other chronic pain: Secondary | ICD-10-CM

## 2018-07-09 MED ORDER — CYCLOBENZAPRINE HCL 10 MG PO TABS: 10.0000 mg | ORAL_TABLET | Freq: Three times a day (TID) | ORAL | 0 refills | Status: DC | PRN

## 2018-07-29 ENCOUNTER — Other Ambulatory Visit: Payer: Self-pay | Admitting: Family Medicine

## 2018-07-29 DIAGNOSIS — R600 Localized edema: Secondary | ICD-10-CM

## 2018-08-08 ENCOUNTER — Other Ambulatory Visit: Payer: Self-pay | Admitting: Family Medicine

## 2018-08-08 DIAGNOSIS — M25562 Pain in left knee: Secondary | ICD-10-CM

## 2018-08-08 DIAGNOSIS — G8929 Other chronic pain: Secondary | ICD-10-CM

## 2018-08-08 DIAGNOSIS — M5441 Lumbago with sciatica, right side: Principal | ICD-10-CM

## 2018-09-10 ENCOUNTER — Other Ambulatory Visit: Payer: Self-pay | Admitting: Family Medicine

## 2018-09-10 DIAGNOSIS — G8929 Other chronic pain: Secondary | ICD-10-CM

## 2018-09-10 DIAGNOSIS — M5441 Lumbago with sciatica, right side: Principal | ICD-10-CM

## 2018-10-23 ENCOUNTER — Other Ambulatory Visit: Payer: Self-pay | Admitting: Family Medicine

## 2018-10-23 DIAGNOSIS — G8929 Other chronic pain: Secondary | ICD-10-CM

## 2018-10-23 DIAGNOSIS — M5441 Lumbago with sciatica, right side: Principal | ICD-10-CM

## 2018-10-31 ENCOUNTER — Other Ambulatory Visit: Payer: Self-pay | Admitting: Family Medicine

## 2018-10-31 DIAGNOSIS — R600 Localized edema: Secondary | ICD-10-CM

## 2018-11-21 ENCOUNTER — Other Ambulatory Visit: Payer: Self-pay | Admitting: Family Medicine

## 2018-11-21 DIAGNOSIS — G8929 Other chronic pain: Secondary | ICD-10-CM

## 2018-11-21 DIAGNOSIS — M5441 Lumbago with sciatica, right side: Principal | ICD-10-CM

## 2018-12-19 ENCOUNTER — Other Ambulatory Visit: Payer: Self-pay | Admitting: Family Medicine

## 2018-12-19 DIAGNOSIS — M5441 Lumbago with sciatica, right side: Secondary | ICD-10-CM

## 2018-12-19 DIAGNOSIS — G8929 Other chronic pain: Secondary | ICD-10-CM

## 2019-01-19 ENCOUNTER — Other Ambulatory Visit: Payer: Self-pay | Admitting: Family Medicine

## 2019-01-19 DIAGNOSIS — G8929 Other chronic pain: Secondary | ICD-10-CM

## 2019-01-19 DIAGNOSIS — M25562 Pain in left knee: Secondary | ICD-10-CM

## 2019-01-30 ENCOUNTER — Ambulatory Visit: Payer: Self-pay | Admitting: Family Medicine

## 2019-01-30 ENCOUNTER — Other Ambulatory Visit: Payer: Self-pay

## 2019-01-30 ENCOUNTER — Encounter: Payer: Self-pay | Admitting: Family Medicine

## 2019-01-30 VITALS — BP 138/85 | HR 107 | Temp 98.5°F | Ht 69.0 in | Wt 378.0 lb

## 2019-01-30 DIAGNOSIS — M2550 Pain in unspecified joint: Secondary | ICD-10-CM

## 2019-01-30 DIAGNOSIS — M25562 Pain in left knee: Secondary | ICD-10-CM

## 2019-01-30 DIAGNOSIS — G8929 Other chronic pain: Secondary | ICD-10-CM

## 2019-01-30 DIAGNOSIS — F314 Bipolar disorder, current episode depressed, severe, without psychotic features: Secondary | ICD-10-CM

## 2019-01-30 DIAGNOSIS — Z6841 Body Mass Index (BMI) 40.0 and over, adult: Secondary | ICD-10-CM

## 2019-01-30 DIAGNOSIS — J9801 Acute bronchospasm: Secondary | ICD-10-CM

## 2019-01-30 DIAGNOSIS — T3695XA Adverse effect of unspecified systemic antibiotic, initial encounter: Secondary | ICD-10-CM

## 2019-01-30 DIAGNOSIS — M5441 Lumbago with sciatica, right side: Secondary | ICD-10-CM

## 2019-01-30 DIAGNOSIS — B379 Candidiasis, unspecified: Secondary | ICD-10-CM

## 2019-01-30 DIAGNOSIS — N3000 Acute cystitis without hematuria: Secondary | ICD-10-CM

## 2019-01-30 DIAGNOSIS — R8290 Unspecified abnormal findings in urine: Secondary | ICD-10-CM

## 2019-01-30 LAB — POCT URINALYSIS DIPSTICK
Bilirubin, UA: NEGATIVE
Blood, UA: NEGATIVE
Glucose, UA: NEGATIVE
Ketones, UA: NEGATIVE
Nitrite, UA: NEGATIVE
Protein, UA: NEGATIVE
Spec Grav, UA: <=1.005 — AB (ref 1.010–1.025)
Urobilinogen, UA: 0.2 E.U./dL
pH, UA: 5 (ref 5.0–8.0)

## 2019-01-30 MED ORDER — ALBUTEROL SULFATE HFA 108 (90 BASE) MCG/ACT IN AERS: 2.0000 | INHALATION_SPRAY | RESPIRATORY_TRACT | 1 refills | Status: DC | PRN

## 2019-01-30 MED ORDER — METHYLPREDNISOLONE ACETATE 40 MG/ML IJ SUSP
40.0000 mg | Freq: Once | INTRAMUSCULAR | Status: AC
  Administered 2019-01-30: 40 mg via INTRA_ARTICULAR

## 2019-01-30 MED ORDER — LIDOCAINE HCL (PF) 1 % IJ SOLN
4.0000 mL | Freq: Once | INTRAMUSCULAR | Status: AC
  Administered 2019-01-30: 4 mL

## 2019-01-30 MED ORDER — FLUCONAZOLE 150 MG PO TABS: ORAL_TABLET | ORAL | 0 refills | Status: DC

## 2019-01-30 MED ORDER — CEPHALEXIN 500 MG PO CAPS: 500.0000 mg | ORAL_CAPSULE | Freq: Three times a day (TID) | ORAL | 0 refills | Status: DC

## 2019-01-30 NOTE — Patient Instructions (Addendum)
Thank you for coming to the office today.  You received a Left Knee Joint steroid injection today. - Lidocaine numbing medicine may ease the pain initially for a few hours until it wears off - As discussed, you may experience a "steroid flare" this evening or within 24-48 hours, anytime medicine is injected into an inflamed joint it can cause the pain to get worse temporarily - Everyone responds differently to these injections, it depends on the patient and the severity of the joint problem, it may provide anywhere from days to weeks, to months of relief. Ideal response is >6 months relief - Try to take it easy for next 1-2 days, avoid over activity and strain on joint (limit walking for knee) - Recommend the following:   - For swelling - rest, compression sleeve / ACE wrap, elevation, and ice packs as needed for first few days   - For pain in future may use heating pad or moist heat as needed  Referral to Chronic Care Management team to assist with your [facility] application and help coordinate your efforts, it is very important that we can get you established with other specialty care who can help provide the services and treatments that you need.  1. You have a Urinary Tract Infection - this is very common, your symptoms are reassuring and you should get better within 1 week on the antibiotics - Start Keflex 534m 3 times daily for next 7 days, complete entire course, even if feeling better - We sent urine for a culture, we will call you within next few days if we need to change antibiotics - Please drink plenty of fluids, improve hydration over next 1 week  If symptoms worsening, developing nausea / vomiting, worsening back pain, fevers / chills / sweats, then please return for re-evaluation sooner or go to Urgent Care.  Trial on Albuterol Inhaler as needed for breathing.   Please schedule a Follow-up Appointment to: Return in about 3 months (around 05/02/2019), or if symptoms  worsen or fail to improve, for knee pain, weight, back pain.  If you have any other questions or concerns, please feel free to call the office or send a message through [facility] You may also schedule an earlier appointment if necessary.  Additionally, you may be receiving a survey about your experience at our office within a few days to 1 week by e-mail or mail. We value your feedback.  ANobie Putnam DO [facility]

## 2019-01-30 NOTE — Progress Notes (Signed)
Subjective:    Patient ID: Maria Jacobson, female    DOB: April 25, 1976, 43 y.o.   MRN: [id number]  Maria Jacobson is a 43 y.o. female presenting on 01/30/2019 for Knee Pain (chronic left knee pain and injection ), Foul urine odor (concern for possible UTI x 2weeks. Pt states the smell improve afs the day goes on.), and Shortness of Breath (x 2 -3 mths. Thats worsen with exertion and mask.)   HPI   FOLLOW-UP LEFT KNEE, CHRONIC PAIN SCIATICA / BACK PAIN / Bilateral leg swelling: - Last visit on 05/2018, for same problem, treated withL knee steroid injection (#5), continued Flexeril, Meloxicam - Has been unsuccessful with previous [place] still has persistent L knee and Low back pain with sciatica symptoms radiating down legs episodic, limited in ambulation and prolong standing, improved if seated or resting -Still taking medicines, regularly Flexeril70m TID, Meloxicam115m Tylenol, usingtopical cream for relief - Regarding LE swelling, still persistentbut seems to be interval improved on lasix, only using with prolonged travel, also using ACE wrap - Knee injections with relief, it has now been 8 months since last one. - She has concerns about muscle pain at times or tender points concern for fibromyalgia - Denies fall or new injury, worsening swelling, leg erythema  Morbid obesity BMI >55 Persistent worsening weight gain now up to 378 lbs limited by joint pain, unable to regularly exercise. Complicating her health issues with joint pain knee pain and back pain, also with mood disorder. - Additionally admits feels more winded and short of breath at times with exertion especially if has to wear face mask.  Bipolar Affective Mood Disorder Followed by Psychiatry/Therapy currently, on med management. Recent worsening flare up of depression during summer recently, partly due to physical limitations On lamictal, abilify, BDZ med management per  psychiatry.  Additional concerns  UTI Reports foul smelling urine, some urinary irritation and frequency. Similar to prior UTI. - Also will often get yeast infection on antibiotics. Denies fever chills sweats nausea vomiting abdomen pain or flank pain blood in urine   [facility] 01/30/2019 10/03/2017  Decreased Interest 1 2  Down, Depressed, Hopeless 2 3  PHQ - 2 Score 3 5  Altered sleeping 3 2  Tired, decreased energy 3 3  Change in appetite - 2  Feeling bad or failure about yourself  1 3  Trouble concentrating 3 2  Moving slowly or fidgety/restless 0 0  Suicidal thoughts 1 2  PHQ-9 Score 14 19  Difficult doing work/chores Not difficult at all Not difficult at all   No flowsheet data found.   Social History   Tobacco Use   Smoking status: Never Smoker   Smokeless tobacco: Never Used  Substance Use Topics   Alcohol use: Yes    Comment: rarely   Drug use: No    Review of Systems Per HPI unless specifically indicated above     Objective:    BP 138/85 (BP Location: Left Arm, Patient Position: Sitting, Cuff Size: Large)    Pulse (!) 107    Temp 98.5 F (36.9 C) (Oral)    Ht 5' 9"  (1.753 m)    Wt (!) 378 lb (171.5 kg)    BMI 55.82 kg/m   Wt Readings from Last 3 Encounters:  01/30/19 (!) 378 lb (171.5 kg)  05/10/18 (!) 365 lb (165.6 kg)  10/03/17 (!) 350 lb (158.8 kg)    Physical Exam Vitals signs and nursing note reviewed.  Constitutional:      General: She is not in acute distress.    Appearance: She is well-developed. She is not diaphoretic.     Comments: Well-appearing, discomfort with left knee pain, cooperative, morbidly obese  HENT:     Head: Normocephalic and atraumatic.  Eyes:     Conjunctiva/sclera: Conjunctivae normal.  Cardiovascular:     Rate and Rhythm: Normal rate and regular rhythm.     Heart sounds: Normal heart sounds. No murmur.  Pulmonary:     Effort: Pulmonary effort is normal. No respiratory distress.     Breath sounds:  Normal breath sounds. No wheezing or rales.     Comments: Good air movement. Some upper airway mild audible wheezing sound if takes forceful breath. Musculoskeletal:     Comments: Left Knee Inspection: Persistent abnormal bulky large appearance of knee with surrounding tissue, unable to determine if effusion. No erythema. Palpation: Mild to moderate +TTP Left knee only medial joint line with some palpable soft tissue edema, lateral side non tender. Mild crepitus. Also tenderness posterior L knee. ROM: Reduced ROM flex / ext due to swelling and pain. Strength: 5/5 intact knee flex/ext, ankle dorsi/plantarflex Neurovascular: distally intact sensation light touch and pulses  Skin:    General: Skin is warm and dry.     Findings: No erythema or rash.  Neurological:     Mental Status: She is alert and oriented to person, place, and time.     Comments: Distal sensation intact to light touch  Psychiatric:        Behavior: Behavior normal.     Comments: Well groomed, good eye contact, normal speech and thoughts      ________________________________________________________ PROCEDURE NOTE Date: 01/30/19 Left Knee steroid injection Discussed benefits and risks (including pain, bleeding, infection, steroid flare). Verbal consent given by patient. Medication:  1 cc Depo-medrol 69m and 4 cc Lidocaine 1% without epi Time Out taken  Landmarks identified. Initial difficulty with localizing knee joint space due to some tissue edema and anatomy of her knee. Initial injection was met with resistance and not consistent with proper knee joint space, no medication was actually injected on first attempt. Needle was replaced and proceeded with repeat protocol as follows - Area cleansed with alcohol wipes.Using 21 gauge and 1, 1/2 inch needle, Left knee joint space was injected (with above listed medication) via lateral approach cold spray used for superficial anesthetic.Sterile bandage placed.Patient tolerated  procedure well without bleeding or paresthesias.No complications.   Results for orders placed or performed in visit on 01/30/19  POCT Urinalysis Dipstick  Result Value Ref Range   Color, UA light yellow    Clarity, UA clear    Glucose, UA Negative Negative   Bilirubin, UA negative    Ketones, UA negative    Spec Grav, UA <=1.005 (A) 1.010 - 1.025   Blood, UA negative    pH, UA 5.0 5.0 - 8.0   Protein, UA Negative Negative   Urobilinogen, UA 0.2 0.2 or 1.0 E.U./dL   Nitrite, UA negative    Leukocytes, UA Small (1+) (A) Negative   Appearance     Odor        Assessment & Plan:   Problem List Items Addressed This Visit    Bipolar affective disorder, depressed, severe ([place] Clinically stable with some persistent problem Followed by Mental health psychiatry On med management    Chronic bilateral low back pain with right-sided sciatica   Relevant Orders   Ambulatory referral to Chronic Care  Management Services   Left medial knee pain - Primary   Relevant Orders   Ambulatory referral to Chronic Care Management Services   Morbid obesity with BMI of 50.0-59.9, adult [facility])   Relevant Orders   Ambulatory referral to Chronic Care Management Services   Pain in joint, multiple sites    Other Visit Diagnoses    Foul smelling urine       Relevant Orders   POCT Urinalysis Dipstick (Completed)   Acute cystitis without hematuria       Relevant Medications   cephALEXin (KEFLEX) 500 MG capsule  Urinalysis Dipstick today suggestive of UTI with symptoms Will start empiric keflex antibiotic Add diflucan for yeast infection secondary to antibiotic Urine culture, f/u results    Other Relevant Orders   Urine Culture   Antibiotic-induced yeast infection       Relevant Medications   cephALEXin (KEFLEX) 500 MG capsule   fluconazole (DIFLUCAN) 150 MG tablet   Acute bronchospasm       Relevant Medications   albuterol (VENTOLIN HFA) 108 (90 Base) MCG/ACT inhaler       #Knee pain,  chronic pain / obesity Chronic worsening problems Continued weight gain S/p #6 steroid injections already in past with good results, but has required repeat injections, now 8 months between injection, due to not returning but has had pain for longer - Known chronic >3 yr+ Left medial and generalized knee pain with recurrent swelling, instability and mechanical symptoms. Still concern remains for possible left medial meniscal injury. Complicated by factor of morbid obesity. - Significant barrier to care with no insurance / limited financial - Unable to get established w/ orthopedics or charity care organization, has tried multiple including [facility]  Plan: 1. Left knee steroid injection (#7) performed today, see procedure note 2. Continue RICE therapy, activity modification, Flexeril, Meloxicam  - Again I have very limited options going forward for her significant debilitation due to knee and back problems, limiting her ability to function with lifestyle, exercise, and many other functions limited due to these problems - She has handicap placard  - As we have discussed future long term limitations of steroid injections, now s/p 6 injections we are approaching concern of using too many, advised that I would be okay to proceed only temporarily - she needs to transfer care to Orthopedics or higher level of care specialty in future  - Again she will need MRI and further intervention in future most likely  Referral to CCM Nurse CM and Social Work for assistance with patient who is self pay, has had very significant financial and health barriers limiting her, worsening chronic health due to severe morbid obesity and complications of this, has been unsuccessful at submitting a [facility] Application thus far. - She should benefit from specialty Orthopedics also possibly Bariatrics if available, I have also mentioned that if she can get covered routine medical care she may transfer to a [facility] Primary Care  through [facility] temporarily until she can get insurance as well.  Orders Placed This Encounter  Procedures   Urine Culture   Ambulatory referral to Chronic Care Management Services    Referral Priority:   Routine    Referral Type:   Consultation    Referral Reason:   Care Coordination    Number of Visits Requested:   1   POCT Urinalysis Dipstick     Meds ordered this encounter  Medications   lidocaine (PF) (XYLOCAINE) 1 % injection 4 mL   methylPREDNISolone  acetate (DEPO-MEDROL) injection 40 mg   cephALEXin (KEFLEX) 500 MG capsule    Sig: Take 1 capsule (500 mg total) by mouth 3 (three) times daily. For 7 days    Dispense:  21 capsule    Refill:  0   fluconazole (DIFLUCAN) 150 MG tablet    Sig: Take one tablet by mouth on Day 1. Repeat dose 2nd tablet on Day 3.    Dispense:  2 tablet    Refill:  0   albuterol (VENTOLIN HFA) 108 (90 Base) MCG/ACT inhaler    Sig: Inhale 2 puffs into the lungs every 4 (four) hours as needed for wheezing or shortness of breath (cough).    Dispense:  8 g    Refill:  1     Follow up plan: Return in about 3 months (around 05/02/2019), or if symptoms worsen or fail to improve, for knee pain, weight, back pain.   [name], [facility] [facility] 01/30/2019, 9:34 AM

## 2019-02-01 LAB — URINE CULTURE
MICRO NUMBER:: 626047
SPECIMEN QUALITY:: ADEQUATE

## 2019-02-07 DIAGNOSIS — M545 Low back pain, unspecified: Secondary | ICD-10-CM

## 2019-02-07 MED ORDER — TRAMADOL HCL 50 MG PO TABS: 50.0000 mg | ORAL_TABLET | Freq: Three times a day (TID) | ORAL | 0 refills | Status: AC | PRN

## 2019-02-08 ENCOUNTER — Ambulatory Visit (INDEPENDENT_AMBULATORY_CARE_PROVIDER_SITE_OTHER): Payer: Self-pay | Admitting: Family Medicine

## 2019-02-08 ENCOUNTER — Encounter: Payer: Self-pay | Admitting: Family Medicine

## 2019-02-08 ENCOUNTER — Other Ambulatory Visit: Payer: Self-pay

## 2019-02-08 DIAGNOSIS — M545 Low back pain, unspecified: Secondary | ICD-10-CM

## 2019-02-08 NOTE — Patient Instructions (Addendum)
1. For your Back Pain - I think that this is due to strain with some spasm. 2. Continue current meds Tramadol as needed, need to limit this in future, may repeat course only rarely, not a long term option 3. Continue Flexeril, meloxicam, muscle relaxant baclofen 4. Recommend to start using heating pad on your lower back 1-2x daily for few weeks  This pain may take weeks to months to fully resolve, but hopefully it will respond to the medicine initially. All back injuries (small or serious) are slow to heal since we use our back muscles every day. Be careful with turning, twisting, lifting, sitting / standing for prolonged periods, and avoid re-injury.  If your symptoms significantly worsen with more pain, or new symptoms with weakness in one or both legs, new or different shooting leg pains, numbness in legs or groin, loss of control or retention of urine or bowel movements, please call back for advice and you may need to go directly to the Emergency Department.   Please schedule a Follow-up Appointment to: Return in about 4 weeks (around 03/08/2019), or if symptoms worsen or fail to improve, for back pain.  If you have any other questions or concerns, please feel free to call the office or send a message through [place]. You may also schedule an earlier appointment if necessary.  Additionally, you may be receiving a survey about your experience at our office within a few days to 1 week by e-mail or mail. We value your feedback.  [name], DO [place]

## 2019-02-08 NOTE — Progress Notes (Signed)
Virtual Visit via Telephone The purpose of this virtual visit is to provide medical care while limiting exposure to the novel coronavirus (COVID19) for both patient and office staff.  Consent was obtained for phone visit:  Yes.   Answered questions that patient had about telehealth interaction:  Yes.   I discussed the limitations, risks, security and privacy concerns of performing an evaluation and management service by telephone. I also discussed with the patient that there may be a patient responsible charge related to this service. The patient expressed understanding and agreed to proceed.  Patient Location: Home Provider Location: [facility])  ---------------------------------------------------------------------- Chief Complaint  Patient presents with  . Back Pain    lower back pain and tailbone     S: Reviewed CMA documentation. I have called patient and gathered additional HPI as follows:  - Contact on mychart and phone yesterday 02/07/19  LOW BACK PAIN, Acute - Reports symptoms started about 1 day ago with inciting event with bending forward and then straightening without any actual injury, developed sudden pain, she contacted our office via mychart and I spoke to her on phone yesterday 02/07/19, see notes, she was given short term acute Tramadol pain medicine. - Today seems to be gradually improving in 24 hours, but thinks the pain medicine is helping most. Describes pain as persistent, moderate to severe with intermittent worsening with movements side to side or transition sitting to stand. No pain radiating to legs. - Now taking the newly rx Tramadol 49m q 8 hr PRN with good results. - Taking Tylenol regularly added few extra doses with limited relief, Taking Flexeril, Meloxicam - Tried heating pad with some relief - History of lumbar OA/DJD, without recent x-ray, no prior back surgery - No similar acute back strain similar - Able to sleep with xanax last  night - Denies any fevers/chills, numbness, tingling, weakness, loss of control bladder/bowel incontinence or retention, unintentional wt loss, night sweats   Past Medical History:  Diagnosis Date  . Anxiety    pt is seen by psych  . Bipolar 1 disorder ([place]   . Bipolar affective disorder, depressed, severe ([place]   . Depression    Social History   Tobacco Use  . Smoking status: Never Smoker  . Smokeless tobacco: Never Used  Substance Use Topics  . Alcohol use: Yes    Comment: rarely  . Drug use: No    Current Outpatient Medications:  .  ABILIFY MAINTENA 400 MG PRSY, INTRAMUSCULAR EVERY 4 WEEKS, Disp: , Rfl: 3 .  albuterol (VENTOLIN HFA) 108 (90 Base) MCG/ACT inhaler, Inhale 2 puffs into the lungs every 4 (four) hours as needed for wheezing or shortness of breath (cough)., Disp: 8 g, Rfl: 1 .  ALPRAZolam (XANAX) 1 MG tablet, Take 1 mg by mouth at bedtime as needed for anxiety., Disp: , Rfl:  .  amphetamine-dextroamphetamine (ADDERALL) 30 MG tablet, Take 30 mg by mouth daily., Disp: , Rfl:  .  clonazePAM (KLONOPIN) 0.5 MG tablet, Take 0.5 mg by mouth 5 (five) times daily., Disp: , Rfl:  .  cyclobenzaprine (FLEXERIL) 10 MG tablet, TAKE 1 TABLET BY MOUTH THREE TIMES A DAY AS NEEDED FOR MUSCLE SPASMS, Disp: 90 tablet, Rfl: 2 .  furosemide (LASIX) 20 MG tablet, TAKE 1 TAB BY MOUTH EVERY DAY FOR SWELLING, MAY INCREASE TO 2 TABS IF NEED FOR MAX 7 DAYS PER FLARE, Disp: 90 tablet, Rfl: 0 .  lamoTRIgine (LAMICTAL) 100 MG tablet, Take 200 mg by mouth  2 (two) times daily. , Disp: , Rfl:  .  meloxicam (MOBIC) 15 MG tablet, TAKE 1 TABLET BY MOUTH EVERY DAY, Disp: 90 tablet, Rfl: 1 .  traMADol (ULTRAM) 50 MG tablet, Take 1 tablet (50 mg total) by mouth every 8 (eight) hours as needed for up to 5 days., Disp: 15 tablet, Rfl: 0  Depression screen [facility] 2/9 02/08/2019 01/30/2019 10/03/2017  Decreased Interest 0 1 2  Down, Depressed, Hopeless 0 2 3  PHQ - 2 Score 0 3 5  Altered sleeping 0 3 2  Tired,  decreased energy 0 3 3  Change in appetite 0 - 2  Feeling bad or failure about yourself  0 1 3  Trouble concentrating 0 3 2  Moving slowly or fidgety/restless 0 0 0  Suicidal thoughts 0 1 2  PHQ-9 Score 0 14 19  Difficult doing work/chores Not difficult at all Not difficult at all Not difficult at all    No flowsheet data found.  -------------------------------------------------------------------------- O: No physical exam performed due to remote telephone encounter.  Lab results reviewed.   -------------------------------------------------------------------------- A&P:  Problem List Items Addressed This Visit    None    Visit Diagnoses    Acute bilateral low back pain without sciatica    -  Primary     Acute on chronic b/l LBP without associated sciatica. Suspect likely due to muscle spasm/strain, with mild low impact flexion injury only. Known chronic back pain, with DJD - No red flag symptoms. Negative SLR for radiculopathy Not responding to conservative therapy  Plan: 1. Continue newly rx Tramadol 92m q 8 hr PRN - acute 5 day supply only, can repeat in future but use rarely, advised not ideal long term management - Ultimately needs to pursue other options as discussed with plans for establishing for orthopedics / bariatrics and other longer term solutions 2. Continue existing meloxicam, flexeril, tylenol 3. Encouraged use of heating pad 1-2x daily for now then PRN  Follow-up 4-6 weeks if not improved for re-evaluation, consider X-ray imaging, possible PT if need   No orders of the defined types were placed in this encounter.   Follow-up: - Return in 4-6 weeks if back pain unresolved  Patient verbalizes understanding with the above medical recommendations including the limitation of remote medical advice.  Specific follow-up and call-back criteria were given for patient to follow-up or seek medical care more urgently if needed.  - Time spent in direct  consultation with patient on phone: 15 minutes  ANobie Putnam [facility] 02/08/2019, 11:41 AM

## 2019-02-18 ENCOUNTER — Telehealth: Payer: Self-pay

## 2019-02-19 ENCOUNTER — Other Ambulatory Visit: Payer: Self-pay | Admitting: Family Medicine

## 2019-02-19 DIAGNOSIS — M545 Low back pain, unspecified: Secondary | ICD-10-CM

## 2019-02-25 ENCOUNTER — Telehealth: Payer: Self-pay

## 2019-02-28 ENCOUNTER — Telehealth: Payer: Self-pay

## 2019-03-11 ENCOUNTER — Ambulatory Visit: Payer: Self-pay | Admitting: *Deleted

## 2019-03-11 ENCOUNTER — Telehealth: Payer: Self-pay

## 2019-03-11 NOTE — Chronic Care Management (AMB) (Signed)
  Chronic Care Management   Outreach Note  03/11/2019 Name: Maria Jacobson MRN: [id number] DOB: 1976/04/12  Referred by: [name], DO Reason for referral : Chronic Care Management (Unsuccessful outreachx1)   An unsuccessful telephone outreach was attempted today. The patient was referred to the case management team  for assistance with chronic care management and care coordination.   Follow Up Plan: A HIPPA compliant phone message was left for the patient providing contact information and requesting a return call.  The care management team will reach out to the patient again over the next 30 days.   Merlene Morse Gleb Mcguire RN, BSN Nurse Case Pharmacist, [facility] Care Management  [phone]) [employer]

## 2019-03-14 ENCOUNTER — Other Ambulatory Visit: Payer: Self-pay | Admitting: Family Medicine

## 2019-03-14 DIAGNOSIS — G8929 Other chronic pain: Secondary | ICD-10-CM

## 2019-03-28 ENCOUNTER — Ambulatory Visit: Payer: Self-pay | Admitting: Licensed Clinical Social Worker

## 2019-03-28 ENCOUNTER — Telehealth: Payer: Self-pay

## 2019-03-28 NOTE — Chronic Care Management (AMB) (Signed)
  Care Management   Follow Up Note   03/28/2019 Name: Maria Jacobson MRN: [id number] DOB: [birth year]  Referred by: [name], DO Reason for referral : Care Coordination   Maria Jacobson is a 43 y.o. year old female who is a primary care patient of [name], DO. The care management team was consulted for assistance with care management and care coordination needs.    Review of patient status, including review of consultants reports, relevant laboratory and other test results, and collaboration with appropriate care team members and the patient's provider was performed as part of comprehensive patient evaluation and provision of chronic care management services.    LCSW completed CCM outreach attempt today but was unable to reach patient successfully. A HIPPA compliant voice message was left encouraging patient to return call once available. LCSW rescheduled CCM SW appointment as well.  A HIPPA compliant phone message was left for the patient providing contact information and requesting a return call.   Eula Fried, BSW, MSW, [email or web address] Phone: [phone]

## 2019-04-01 ENCOUNTER — Ambulatory Visit: Payer: Self-pay | Admitting: *Deleted

## 2019-04-01 ENCOUNTER — Telehealth: Payer: Self-pay

## 2019-04-01 NOTE — Chronic Care Management (AMB) (Signed)
  Chronic Care Management   Outreach Note  04/01/2019 Name: Maria Jacobson MRN: [id number] DOB: [birth year]  Referred by: [name], DO Reason for referral : Chronic Care Management (Unsuccessful attempt x2 )   A second unsuccessful telephone outreach was attempted today. The patient was referred to the case management team for assistance with chronic care management and care coordination.   Follow Up Plan: A HIPPA compliant phone message was left for the patient providing contact information and requesting a return call.  The care management team will reach out to the patient again over the next 30 days.   Merlene Morse Makari Sanko RN, BSN Nurse Case Pharmacist, [facility] Care [facility]  [phone]) [employer]

## 2019-04-25 ENCOUNTER — Telehealth: Payer: Self-pay

## 2019-04-25 ENCOUNTER — Ambulatory Visit: Payer: Self-pay | Admitting: Licensed Clinical Social Worker

## 2019-04-25 NOTE — Chronic Care Management (AMB) (Signed)
  Care Management   Follow Up Note   04/25/2019 Name: Maribella Kuna MRN: [id number] DOB: [birth year]  Referred by: [name], DO Reason for referral : Care Coordination   Tahra Shatz is a 43 y.o. year old female who is a primary care patient of [name], DO. The care management team was consulted for assistance with care management and care coordination needs.    Review of patient status, including review of consultants reports, relevant laboratory and other test results, and collaboration with appropriate care team members and the patient's provider was performed as part of comprehensive patient evaluation and provision of chronic care management services.    LCSW completed CCM outreach attempt today but was unable to reach patient successfully. A HIPPA compliant voice message was left encouraging patient to return call once available. LCSW rescheduled CCM SW appointment as well.  A HIPPA compliant phone message was left for the patient providing contact information and requesting a return call.   Eula Fried, BSW, MSW, [email or web address] Phone: [phone]

## 2019-05-06 ENCOUNTER — Ambulatory Visit: Payer: Self-pay | Admitting: *Deleted

## 2019-05-06 ENCOUNTER — Telehealth: Payer: Self-pay

## 2019-05-06 NOTE — Chronic Care Management (AMB) (Signed)
  Chronic Care Management   Outreach Note  05/06/2019 Name: Maria Jacobson MRN: [id number] DOB: 22-Jul-1976  Referred by: [name], DO Reason for referral : Chronic Care Management (Unsuccessful attempt x3 )   Third unsuccessful telephone outreach was attempted today. The patient was referred to the case management team for assistance with care management and care coordination. The patient's primary care provider has been notified of our unsuccessful attempts to make or maintain contact with the patient. The care management team is pleased to engage with this patient at any time in the future should he/she be interested in assistance from the care management team.   Follow Up Plan: A HIPPA compliant phone message was left for the patient providing contact information and requesting a return call.  The care management team is available to follow up with the patient after provider conversation with the patient regarding recommendation for care management engagement and subsequent re-referral to the care management team.   Quinn Quam RN, BSN Nurse Case Manager [facility] Care Management  [phone]) [employer]

## 2019-05-14 ENCOUNTER — Other Ambulatory Visit: Payer: Self-pay | Admitting: Family Medicine

## 2019-05-14 DIAGNOSIS — J9801 Acute bronchospasm: Secondary | ICD-10-CM

## 2019-06-03 ENCOUNTER — Other Ambulatory Visit: Payer: Self-pay | Admitting: Family Medicine

## 2019-06-03 DIAGNOSIS — G8929 Other chronic pain: Secondary | ICD-10-CM

## 2019-06-20 ENCOUNTER — Ambulatory Visit: Payer: Self-pay | Admitting: Licensed Clinical Social Worker

## 2019-06-20 ENCOUNTER — Telehealth: Payer: Self-pay

## 2019-06-20 NOTE — Chronic Care Management (AMB) (Signed)
  Care Management   Follow Up Note   06/20/2019 Name: Maria Jacobson MRN: [id number] DOB: [birth year]  Referred by: [name], DO Reason for referral : Care Coordination   Maria Jacobson is a 43 y.o. year old female who is a primary care patient of [name], DO. The care management team was consulted for assistance with care management and care coordination needs.    Review of patient status, including review of consultants reports, relevant laboratory and other test results, and collaboration with appropriate care team members and the patient's provider was performed as part of comprehensive patient evaluation and provision of chronic care management services.    LCSW completed CCM outreach attempt today but was unable to reach patient successfully. A HIPPA compliant voice message was left encouraging patient to return call once available. LCSW rescheduled CCM SW appointment as well.  A HIPPA compliant phone message was left for the patient providing contact information and requesting a return call.   Eula Fried, BSW, MSW, [email or web address] Phone: [phone]

## 2019-07-16 ENCOUNTER — Other Ambulatory Visit: Payer: Self-pay | Admitting: Family Medicine

## 2019-07-16 DIAGNOSIS — J9801 Acute bronchospasm: Secondary | ICD-10-CM

## 2019-07-24 ENCOUNTER — Other Ambulatory Visit: Payer: Self-pay | Admitting: Family Medicine

## 2019-07-24 DIAGNOSIS — M25562 Pain in left knee: Secondary | ICD-10-CM

## 2019-07-24 DIAGNOSIS — G8929 Other chronic pain: Secondary | ICD-10-CM

## 2019-07-24 NOTE — Telephone Encounter (Signed)
Should not be due for the flexeril for another 2 weeks. Refill on the meloxicam sent

## 2019-07-27 ENCOUNTER — Other Ambulatory Visit: Payer: Self-pay | Admitting: Family Medicine

## 2019-07-27 DIAGNOSIS — G8929 Other chronic pain: Secondary | ICD-10-CM

## 2019-09-19 ENCOUNTER — Other Ambulatory Visit: Payer: Self-pay

## 2019-09-19 DIAGNOSIS — M25562 Pain in left knee: Secondary | ICD-10-CM

## 2019-09-19 DIAGNOSIS — G8929 Other chronic pain: Secondary | ICD-10-CM

## 2019-09-19 DIAGNOSIS — R600 Localized edema: Secondary | ICD-10-CM

## 2019-09-19 MED ORDER — FUROSEMIDE 20 MG PO TABS: ORAL_TABLET | ORAL | 0 refills | Status: DC

## 2019-09-19 MED ORDER — MELOXICAM 15 MG PO TABS: 15.0000 mg | ORAL_TABLET | Freq: Every day | ORAL | 0 refills | Status: DC

## 2019-09-25 ENCOUNTER — Other Ambulatory Visit: Payer: Self-pay | Admitting: Family Medicine

## 2019-09-25 ENCOUNTER — Encounter: Payer: Self-pay | Admitting: Family Medicine

## 2019-09-25 ENCOUNTER — Other Ambulatory Visit: Payer: Self-pay

## 2019-09-25 ENCOUNTER — Ambulatory Visit: Payer: Medicaid Other | Admitting: Family Medicine

## 2019-09-25 VITALS — BP 132/73 | HR 110 | Temp 97.7°F | Resp 16 | Ht 69.0 in | Wt 376.0 lb

## 2019-09-25 DIAGNOSIS — B379 Candidiasis, unspecified: Secondary | ICD-10-CM

## 2019-09-25 DIAGNOSIS — T3695XA Adverse effect of unspecified systemic antibiotic, initial encounter: Secondary | ICD-10-CM

## 2019-09-25 DIAGNOSIS — M25511 Pain in right shoulder: Secondary | ICD-10-CM

## 2019-09-25 DIAGNOSIS — R7309 Other abnormal glucose: Secondary | ICD-10-CM

## 2019-09-25 DIAGNOSIS — M5441 Lumbago with sciatica, right side: Secondary | ICD-10-CM

## 2019-09-25 DIAGNOSIS — N3000 Acute cystitis without hematuria: Secondary | ICD-10-CM

## 2019-09-25 DIAGNOSIS — E782 Mixed hyperlipidemia: Secondary | ICD-10-CM

## 2019-09-25 DIAGNOSIS — G8929 Other chronic pain: Secondary | ICD-10-CM

## 2019-09-25 DIAGNOSIS — F314 Bipolar disorder, current episode depressed, severe, without psychotic features: Secondary | ICD-10-CM

## 2019-09-25 DIAGNOSIS — K219 Gastro-esophageal reflux disease without esophagitis: Secondary | ICD-10-CM

## 2019-09-25 DIAGNOSIS — M25561 Pain in right knee: Secondary | ICD-10-CM

## 2019-09-25 DIAGNOSIS — R8290 Unspecified abnormal findings in urine: Secondary | ICD-10-CM

## 2019-09-25 DIAGNOSIS — M25562 Pain in left knee: Secondary | ICD-10-CM

## 2019-09-25 DIAGNOSIS — Z79899 Other long term (current) drug therapy: Secondary | ICD-10-CM

## 2019-09-25 DIAGNOSIS — Z6841 Body Mass Index (BMI) 40.0 and over, adult: Secondary | ICD-10-CM

## 2019-09-25 MED ORDER — CEPHALEXIN 500 MG PO CAPS: 500.0000 mg | ORAL_CAPSULE | Freq: Three times a day (TID) | ORAL | 0 refills | Status: DC

## 2019-09-25 MED ORDER — FLUCONAZOLE 150 MG PO TABS: ORAL_TABLET | ORAL | 0 refills | Status: DC

## 2019-09-25 MED ORDER — LIDOCAINE HCL (PF) 1 % IJ SOLN
4.0000 mL | Freq: Once | INTRAMUSCULAR | Status: AC
  Administered 2019-09-25: 4 mL

## 2019-09-25 MED ORDER — OMEPRAZOLE 20 MG PO CPDR: 20.0000 mg | DELAYED_RELEASE_CAPSULE | Freq: Every day | ORAL | 2 refills | Status: DC | PRN

## 2019-09-25 MED ORDER — METHYLPREDNISOLONE ACETATE 40 MG/ML IJ SUSP
40.0000 mg | Freq: Once | INTRAMUSCULAR | Status: AC
  Administered 2019-09-25: 40 mg via INTRA_ARTICULAR

## 2019-09-25 NOTE — Progress Notes (Signed)
Subjective:    Patient ID: Maria Jacobson, female    DOB: Apr 27, 1976, 44 y.o.   MRN: [id number]  Maria Jacobson is a 44 y.o. female presenting on 09/25/2019 for Urinary Tract Infection (onset 3 week), Knee Pain (onset 6-8 week), and Shoulder Pain (onset 4 week)   HPI   Acute Right Knee Pain Injured 6-8 weeks ago, She is using mobility chair and had issues with crouching down repeatedly, feels like had acute injury possible ligament. Felt similar to her L knee at time of prior injury. - Persistent pain and swelling now since onset. Without improvement. - Taking NSAID Meloxicam, Flexeril PRN some temporary relief. - She has limited mobility now, not walking as much. Pain with ambulation.  Pain Right Shoulder, subacute Unsure injury, reptitive motion, prior L Shoulder dislocated, but has not injured R shoulder. Says now painful raising arm above her shoulder level, raising arm to head, or behind back or holding arm out cause pain. - Worse if sleep on side. - Taking meds as above, temporary relief - Denies any numbness tingling or focal weakness  Overall updates Quality of life has decreased due to joint pain and weight gain, reduced mobility Bipolar flare worse at times in 2020, following with Psych  Chronic Left knee pain Left knee still bothers her chronic pain is moderate to severe, but now R is worse. She has had several steroid injection in L knee in the past, last was >6-8 months ago, had done well but again only temporary. - She has been unable to get established with a [facility] through application for financial assistance, said she was not able to follow through with CCM team last time she was referred in 2020 but is ready now to work with them. She has been unable to afford Orthopedic specialist for few years, and further MRI and other intervention.  Variety of symptoms Additionally admits some dizziness worse if lay down  UTI Onset x 3 weeks - Admits urinary odor -  Denies dysuria, frequency    Depression screen [facility] 2/9 02/08/2019 01/30/2019 10/03/2017  Decreased Interest 0 1 2  Down, Depressed, Hopeless 0 2 3  PHQ - 2 Score 0 3 5  Altered sleeping 0 3 2  Tired, decreased energy 0 3 3  Change in appetite 0 - 2  Feeling bad or failure about yourself  0 1 3  Trouble concentrating 0 3 2  Moving slowly or fidgety/restless 0 0 0  Suicidal thoughts 0 1 2  PHQ-9 Score 0 14 19  Difficult doing work/chores Not difficult at all Not difficult at all Not difficult at all    Social History   Tobacco Use  . Smoking status: Never Smoker  . Smokeless tobacco: Never Used  Substance Use Topics  . Alcohol use: Not Currently    Comment: rarely  . Drug use: No    Review of Systems Per HPI unless specifically indicated above     Objective:    BP 132/73   Pulse (!) 110   Temp 97.7 F (36.5 C) (Temporal)   Resp 16   Ht 5' 9"  (1.753 m)   Wt (!) 376 lb (170.6 kg)   BMI 55.53 kg/m   Wt Readings from Last 3 Encounters:  09/25/19 (!) 376 lb (170.6 kg)  01/30/19 (!) 378 lb (171.5 kg)  05/10/18 (!) 365 lb (165.6 kg)    Physical Exam Vitals and nursing note reviewed.  Constitutional:      General: She is  not in acute distress.    Appearance: She is well-developed. She is not diaphoretic.     Comments: Well-appearing, discomfort with left knee pain, cooperative, morbidly obese  HENT:     Head: Normocephalic and atraumatic.  Eyes:     Conjunctiva/sclera: Conjunctivae normal.  Cardiovascular:     Rate and Rhythm: Normal rate and regular rhythm.     Heart sounds: Normal heart sounds. No murmur.  Pulmonary:     Effort: Pulmonary effort is normal. No respiratory distress.     Breath sounds: Normal breath sounds. No wheezing or rales.     Comments: Good air movement. Some upper airway mild audible wheezing sound if takes forceful breath. Musculoskeletal:     Comments: R Knee Inspection: Persistent abnormal bulky large appearance of knee with surrounding  tissue. No erythema. Palpation:  moderate +TTP Rknee only bilateral joint line with some palpable soft tissue edema, Mild crepitus. ROM: Reduced ROM flex / ext due to swelling and pain. Special test- standing thessaly test is positive pain and some instability R lateral Strength: 5/5 intact knee flex/ext, ankle dorsi/plantarflex Neurovascular: distally intact sensation light touch and pulses  Right Shoulder Inspection: Normal appearance bilateral symmetrical Palpation: Tender to palpation over posterior ROM: Significant reduced ROM forward flex, abduct, internal rotation behind back. Due to pain Special Testing: Rotator cuff testing negative for weakness with supraspinatus full can and provoked pain worse on empty can. Positive impingement testing Strength: Normal strength 5/5 flex/ext, ext rot / int rot, grip, rotator cuff str testing. Neurovascular: Distally intact pulses, sensation to light touch   Skin:    General: Skin is warm and dry.     Findings: No erythema or rash.  Neurological:     Mental Status: She is alert and oriented to person, place, and time.     Comments: Distal sensation intact to light touch  Psychiatric:        Behavior: Behavior normal.     Comments: Well groomed, good eye contact, normal speech and thoughts    ________________________________________________________ PROCEDURE NOTE Date: 09/25/19 Right Knee corticosteroid injection Discussed benefits and risks (including pain, bleeding, infection, steroid flare). Verbal consent given by patient. Medication:  1 cc Depo-medrol 7m and 4 cc Lidocaine 1% without epi Time Out taken  Landmarks identified. Area cleansed with alcohol wipes. Using 21 gauge and 1, 1/2 inch needle, Right knee joint space was injected (with above listed medication) via lateral approach cold spray used for superficial anesthetic. Sterile bandage placed. Patient tolerated procedure well without bleeding or paresthesias. No  complications.  ________________________________________________________ PROCEDURE NOTE Date: 09/25/19 Right shoulder corticosteroid injection Discussed benefits and risks (including pain, bleeding, infection, steroid flare). Verbal consent given by patient. Medication:  1 cc Depo-medrol 434mand 4 cc Lidocaine 1% without epi Time Out taken  Landmarks identified. Area cleansed with alcohol wipes. Using 21 gauge and 1, 1/2 inch needle, Right subacromial bursa space was injected (with above listed medication) via posterior approach cold spray used for superficial anesthetic. Sterile bandage placed. Patient tolerated procedure well without bleeding or paresthesias. No complications.    Results for orders placed or performed in visit on 01/30/19  Urine Culture   Specimen: Urine  Result Value Ref Range   MICRO NUMBER: [id number]  SPECIMEN QUALITY: Adequate    Sample Source NOT GIVEN    STATUS: FINAL    ISOLATE 1: Escherichia coli (A)       Susceptibility   Escherichia coli - URINE CULTURE, REFLEX  AMOX/CLAVULANIC <=2 Sensitive     AMPICILLIN <=2 Sensitive     AMPICILLIN/SULBACTAM <=2 Sensitive     CEFAZOLIN* <=4 Not Reportable      * For infections other than uncomplicated UTIcaused by E. coli, K. pneumoniae or P. mirabilis:Cefazolin is resistant if MIC > or = 8 mcg/mL.(Distinguishing susceptible versus intermediatefor isolates with MIC < or = 4 mcg/mL requiresadditional testing.)For uncomplicated UTI caused by E. coli,K. pneumoniae or P. mirabilis: Cefazolin issusceptible if MIC <32 mcg/mL and predictssusceptible to the oral agents cefaclor, cefdinir,cefpodoxime, cefprozil, cefuroxime, cephalexinand loracarbef.    CEFEPIME <=1 Sensitive     CEFTRIAXONE <=1 Sensitive     CIPROFLOXACIN <=0.25 Sensitive     LEVOFLOXACIN 1 Intermediate     ERTAPENEM <=0.5 Sensitive     GENTAMICIN <=1 Sensitive     IMIPENEM <=0.25 Sensitive     NITROFURANTOIN <=16 Sensitive     PIP/TAZO <=4 Sensitive      TOBRAMYCIN <=1 Sensitive     TRIMETH/SULFA* <=20 Sensitive      * For infections other than uncomplicated UTIcaused by E. coli, K. pneumoniae or P. mirabilis:Cefazolin is resistant if MIC > or = 8 mcg/mL.(Distinguishing susceptible versus intermediatefor isolates with MIC < or = 4 mcg/mL requiresadditional testing.)For uncomplicated UTI caused by E. coli,K. pneumoniae or P. mirabilis: Cefazolin issusceptible if MIC <32 mcg/mL and predictssusceptible to the oral agents cefaclor, cefdinir,cefpodoxime, cefprozil, cefuroxime, cephalexinand loracarbef.Legend:S = Susceptible  I = IntermediateR = Resistant  NS = Not susceptible* = Not tested  NR = Not reported**NN = See antimicrobic comments  POCT Urinalysis Dipstick  Result Value Ref Range   Color, UA light yellow    Clarity, UA clear    Glucose, UA Negative Negative   Bilirubin, UA negative    Ketones, UA negative    Spec Grav, UA <=1.005 (A) 1.010 - 1.025   Blood, UA negative    pH, UA 5.0 5.0 - 8.0   Protein, UA Negative Negative   Urobilinogen, UA 0.2 0.2 or 1.0 E.U./dL   Nitrite, UA negative    Leukocytes, UA Small (1+) (A) Negative   Appearance     Odor        Assessment & Plan:   Problem List Items Addressed This Visit    Morbid obesity with BMI of 50.0-59.9, adult ([place])   Relevant Orders   Ambulatory referral to Chronic Care Management Services   GERD (gastroesophageal reflux disease)    Episodic GERD flare certain foods Rx omeprazole 27m daily PRN      Relevant Medications   omeprazole (PRILOSEC) 20 MG capsule   Chronic bilateral low back pain with right-sided sciatica   Relevant Orders   Ambulatory referral to Chronic Care Management Services   Bipolar affective disorder, depressed, severe ([place]    Stable, chronic problem Followed by Psychiatry WRondall AllegraControlled on current med regimen per Psychiatry, no med management from our office at this time      Relevant Orders   Ambulatory referral to Chronic Care  Management Services    Other Visit Diagnoses    Foul smelling urine    -  Primary   Relevant Medications   cephALEXin (KEFLEX) 500 MG capsule   Other Relevant Orders   POCT urinalysis dipstick   Urine Culture   Acute cystitis without hematuria       Relevant Medications   cephALEXin (KEFLEX) 500 MG capsule   Antibiotic-induced yeast infection       Relevant Medications   cephALEXin (KEFLEX) 500  MG capsule   fluconazole (DIFLUCAN) 150 MG tablet   Acute pain of right knee       Relevant Medications   lidocaine (PF) (XYLOCAINE) 1 % injection 4 mL (Completed)   methylPREDNISolone acetate (DEPO-MEDROL) injection 40 mg (Completed)   Acute pain of right shoulder       Relevant Medications   lidocaine (PF) (XYLOCAINE) 1 % injection 4 mL (Completed)   methylPREDNISolone acetate (DEPO-MEDROL) injection 40 mg (Completed)   Chronic pain of both knees       Relevant Medications   lidocaine (PF) (XYLOCAINE) 1 % injection 4 mL (Completed)   methylPREDNISolone acetate (DEPO-MEDROL) injection 40 mg (Completed)   lidocaine (PF) (XYLOCAINE) 1 % injection 4 mL (Completed)   methylPREDNISolone acetate (DEPO-MEDROL) injection 40 mg (Completed)   Other Relevant Orders   Ambulatory referral to Chronic Care Management Services      Acute on chronic, bilateral knee pain, also R shoulder pain acutely 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 Concern with multiple steroid injections in L knee in past, defer to repeat now due to risks of repeated injection, concern w/ non adherence to previous treatment plan Unsuccessful referrals in past to Orthopedic, lack of insurance unable to afford specialist management but also unable to complete financial assistance application  R knee steroid injection today R shoulder subacromial injection today Continue current meds, flexeril PRN  Referral again to CCM nurse case manager and LCSW for helping her coordinate with charity care - likely [facility] - anticipate she  would need to transfer to their care for specialty such as orthopedic and may need to transfer PCP temporarily for covered treatments, future anticipate would need diagnostic MRI L and possibly R knee. Several years of debilitating pain, and limited mobility that has warranted orthopedic follow-up but has not followed through due to various circumstances.  Additionally Possible UTI Treat with keflex, send urine culture   Orders Placed This Encounter  Procedures  . Urine Culture  . Ambulatory referral to Chronic Care Management Services    Referral Priority:   Routine    Referral Type:   Consultation    Referral Reason:   Care Coordination    Number of Visits Requested:   1  . POCT urinalysis dipstick     Meds ordered this encounter  Medications  . cephALEXin (KEFLEX) 500 MG capsule    Sig: Take 1 capsule (500 mg total) by mouth 3 (three) times daily. For 7 days    Dispense:  21 capsule    Refill:  0  . fluconazole (DIFLUCAN) 150 MG tablet    Sig: Take one tablet by mouth on Day 1. Repeat dose 2nd tablet on Day 3.    Dispense:  2 tablet    Refill:  0  . omeprazole (PRILOSEC) 20 MG capsule    Sig: Take 1 capsule (20 mg total) by mouth daily as needed.    Dispense:  30 capsule    Refill:  2  . lidocaine (PF) (XYLOCAINE) 1 % injection 4 mL  . methylPREDNISolone acetate (DEPO-MEDROL) injection 40 mg  . lidocaine (PF) (XYLOCAINE) 1 % injection 4 mL  . methylPREDNISolone acetate (DEPO-MEDROL) injection 40 mg     Follow up plan: Return in about 4 weeks (around 10/23/2019) for 4 week follow-up lab work and office visit for results.  Future labs ordered for 09/2019 approx   [name], DO [facility] 09/25/2019, 11:17 AM

## 2019-09-25 NOTE — Patient Instructions (Addendum)
Thank you for coming to the office today.  You received a Right Knee and Shoulder Joint steroid injection today. - Lidocaine numbing medicine may ease the pain initially for a few hours until it wears off - As discussed, you may experience a "steroid flare" this evening or within 24-48 hours, anytime medicine is injected into an inflamed joint it can cause the pain to get worse temporarily - Everyone responds differently to these injections, it depends on the patient and the severity of the joint problem, it may provide anywhere from days to weeks, to months of relief. Ideal response is >6 months relief - Try to take it easy for next 1-2 days, avoid over activity and strain on joint (limit walking for knee or lifting for shoulder) - Recommend the following:   - For swelling - rest, compression sleeve / ACE wrap, elevation, and ice packs as needed for first few days   - For pain in future may use heating pad or moist heat as needed  Stay tuned for call from [place] RN --------------  If urine results show we need to change antibiotic stay tuned.   DUE for FASTING BLOOD WORK (no food or drink after midnight before the lab appointment, only water or coffee without cream/sugar on the morning of)  SCHEDULE "Lab Only" visit in the morning at the clinic for lab draw in 2-4 WEEKS   - Make sure Lab Only appointment is at about 1 week before your next appointment, so that results will be available  For Lab Results, once available within 2-3 days of blood draw, you can can log in to MyChart online to view your results and a brief explanation. Also, we can discuss results at next follow-up visit.    Please schedule a Follow-up Appointment to: Return in about 4 weeks (around 10/23/2019) for 4 week follow-up lab work and office visit for results.  If you have any other questions or concerns, please feel free to call the office or send a message  through [place]. You may also schedule an earlier appointment if necessary.  Additionally, you may be receiving a survey about your experience at our office within a few days to 1 week by e-mail or mail. We value your feedback.  [name], DO [facility]

## 2019-09-26 ENCOUNTER — Ambulatory Visit: Payer: Self-pay

## 2019-09-26 LAB — POCT URINALYSIS DIPSTICK
Bilirubin, UA: NEGATIVE
Blood, UA: NEGATIVE
Glucose, UA: NEGATIVE
Ketones, UA: NEGATIVE
Leukocytes, UA: NEGATIVE
Nitrite, UA: NEGATIVE
Protein, UA: NEGATIVE
Spec Grav, UA: 1.01 (ref 1.010–1.025)
Urobilinogen, UA: 0.2 E.U./dL
pH, UA: 6 (ref 5.0–8.0)

## 2019-09-26 NOTE — Assessment & Plan Note (Signed)
Stable, chronic problem Followed by Psychiatry Rondall Allegra Controlled on current med regimen per Psychiatry, no med management from our office at this time

## 2019-09-26 NOTE — Assessment & Plan Note (Signed)
Episodic GERD flare certain foods Rx omeprazole 33m daily PRN

## 2019-09-27 ENCOUNTER — Telehealth: Payer: Self-pay | Admitting: Family Medicine

## 2019-09-27 LAB — URINE CULTURE
MICRO NUMBER:: 10184686
SPECIMEN QUALITY:: ADEQUATE

## 2019-09-27 NOTE — Chronic Care Management (AMB) (Signed)
  Care Management   Follow Up Note   09/27/2019 Name: [name] MRN: [id number] DOB: [birth year]  Referred by: [name], DO Reason for referral : Care Coordination   Maria Jacobson is a 44 y.o. year old female who is a primary care patient of [name], DO. The care management team was consulted for assistance with care management and care coordination needs.    Review of patient status, including review of consultants reports, relevant laboratory and other test results, and collaboration with appropriate care team members and the patient's provider was performed as part of comprehensive patient evaluation and provision of chronic care management services.    LCSW completed CCM outreach attempt today but was unable to reach patient successfully. A HIPPA compliant voice message was left encouraging patient to return call once available. LCSW rescheduled CCM SW appointment as well.  The care management team is available to follow up with the patient after provider conversation with the patient regarding recommendation for care management engagement and subsequent re-referral to the care management team.   Eula Fried, BSW, MSW, [email or web address] Phone: [phone]

## 2019-09-27 NOTE — Chronic Care Management (AMB) (Signed)
  Care Management   Note  09/27/2019 Name: Maria Jacobson MRN: [id number] DOB: August 11, 1975  [name] is a 44 y.o. year old female who is a primary care patient of [name], DO. I reached out to [name] by phone today in response to a referral sent by Ms. Biochemist, clinical.    Ms. Das was given information about care management services today including:  1. Care management services include personalized support from designated clinical staff supervised by her physician, including individualized plan of care and coordination with other care providers 2. 24/7 contact phone numbers for assistance for urgent and routine care needs. 3. The patient may stop care management services at any time by phone call to the office staff.  Patient agreed to services and verbal consent obtained.   Follow up plan: Telephone appointment with care management team member scheduled for:11/14/2019  Glenna Durand, [place] Management ??[email or web address] ??[phone]

## 2019-09-29 MED ORDER — NITROFURANTOIN MONOHYD MACRO 100 MG PO CAPS: 100.0000 mg | ORAL_CAPSULE | Freq: Two times a day (BID) | ORAL | 0 refills | Status: DC

## 2019-09-29 NOTE — Addendum Note (Signed)
Addended by: [name] on: 09/29/2019 11:35 AM   Modules accepted: Orders

## 2019-10-15 ENCOUNTER — Other Ambulatory Visit: Payer: Self-pay | Admitting: Family Medicine

## 2019-10-15 DIAGNOSIS — M25562 Pain in left knee: Secondary | ICD-10-CM

## 2019-10-15 DIAGNOSIS — G8929 Other chronic pain: Secondary | ICD-10-CM

## 2019-10-28 ENCOUNTER — Other Ambulatory Visit: Payer: Self-pay

## 2019-10-28 DIAGNOSIS — E782 Mixed hyperlipidemia: Secondary | ICD-10-CM

## 2019-10-28 DIAGNOSIS — R7309 Other abnormal glucose: Secondary | ICD-10-CM

## 2019-10-28 DIAGNOSIS — Z79899 Other long term (current) drug therapy: Secondary | ICD-10-CM

## 2019-10-28 DIAGNOSIS — F314 Bipolar disorder, current episode depressed, severe, without psychotic features: Secondary | ICD-10-CM

## 2019-10-29 LAB — COMPLETE METABOLIC PANEL WITH GFR
AG Ratio: 1.4 (calc) (ref 1.0–2.5)
ALT: 37 U/L — ABNORMAL HIGH (ref 6–29)
AST: 20 U/L (ref 10–35)
Albumin: 4.5 g/dL (ref 3.6–5.1)
Alkaline phosphatase (APISO): 88 U/L (ref 31–125)
BUN: 13 mg/dL (ref 7–25)
CO2: 28 mmol/L (ref 20–32)
Calcium: 9.5 mg/dL (ref 8.6–10.2)
Chloride: 99 mmol/L (ref 98–110)
Creat: 0.95 mg/dL (ref 0.50–1.10)
GFR, Est African American: 84 mL/min/{1.73_m2} (ref >=60)
GFR, Est Non African American: 72 mL/min/{1.73_m2} (ref >=60)
Globulin: 3.2 g/dL (calc) (ref 1.9–3.7)
Glucose, Bld: 111 mg/dL — ABNORMAL HIGH (ref 65–99)
Potassium: 4.4 mmol/L (ref 3.5–5.3)
Sodium: 137 mmol/L (ref 135–146)
Total Bilirubin: 0.5 mg/dL (ref 0.2–1.2)
Total Protein: 7.7 g/dL (ref 6.1–8.1)

## 2019-10-29 LAB — CBC WITH DIFFERENTIAL/PLATELET
Absolute Monocytes: 575 cells/uL (ref 200–950)
Basophils Absolute: 73 cells/uL (ref 0–200)
Basophils Relative: 0.9 %
Eosinophils Absolute: 211 cells/uL (ref 15–500)
Eosinophils Relative: 2.6 %
HCT: 45.7 % — ABNORMAL HIGH (ref 35.0–45.0)
Hemoglobin: 14.9 g/dL (ref 11.7–15.5)
Lymphs Abs: 2657 cells/uL (ref 850–3900)
MCH: 28.5 pg (ref 27.0–33.0)
MCHC: 32.6 g/dL (ref 32.0–36.0)
MCV: 87.5 fL (ref 80.0–100.0)
MPV: 10.2 fL (ref 7.5–12.5)
Monocytes Relative: 7.1 %
Neutro Abs: 4585 cells/uL (ref 1500–7800)
Neutrophils Relative %: 56.6 %
Platelets: 422 10*3/uL — ABNORMAL HIGH (ref 140–400)
RBC: 5.22 10*6/uL — ABNORMAL HIGH (ref 3.80–5.10)
RDW: 13.6 % (ref 11.0–15.0)
Total Lymphocyte: 32.8 %
WBC: 8.1 10*3/uL (ref 3.8–10.8)

## 2019-10-29 LAB — HEMOGLOBIN A1C
Hgb A1c MFr Bld: 5.6 % of total Hgb (ref <5.7)
Mean Plasma Glucose: 114 (calc)
eAG (mmol/L): 6.3 (calc)

## 2019-10-29 LAB — TSH: TSH: 0.79 mIU/L

## 2019-10-29 LAB — LIPID PANEL
Cholesterol: 233 mg/dL — ABNORMAL HIGH (ref <200)
HDL: 50 mg/dL (ref >=50)
LDL Cholesterol (Calc): 153 mg/dL (calc) — ABNORMAL HIGH
Non-HDL Cholesterol (Calc): 183 mg/dL (calc) — ABNORMAL HIGH (ref <130)
Total CHOL/HDL Ratio: 4.7 (calc) (ref <5.0)
Triglycerides: 165 mg/dL — ABNORMAL HIGH (ref <150)

## 2019-11-14 ENCOUNTER — Telehealth: Payer: Medicaid Other | Admitting: General Practice

## 2019-11-14 ENCOUNTER — Ambulatory Visit: Payer: Self-pay | Admitting: General Practice

## 2019-11-14 DIAGNOSIS — F314 Bipolar disorder, current episode depressed, severe, without psychotic features: Secondary | ICD-10-CM

## 2019-11-14 DIAGNOSIS — G8929 Other chronic pain: Secondary | ICD-10-CM

## 2019-11-14 DIAGNOSIS — F419 Anxiety disorder, unspecified: Secondary | ICD-10-CM

## 2019-11-14 DIAGNOSIS — E782 Mixed hyperlipidemia: Secondary | ICD-10-CM

## 2019-11-14 NOTE — Chronic Care Management (AMB) (Signed)
Care Management   Initial Visit Note  11/14/2019 Name: Maria Jacobson MRN: [id number] DOB: June 24, 1976  Subjective:   Objective: BP Readings from Last 3 Encounters:  09/25/19 132/73  01/30/19 138/85  05/10/18 (!) 139/96    Assessment: Maria Jacobson is a 44 y.o. year old female who sees [name], DO for primary care. The care management team was consulted for assistance with care management and care coordination needs related to Disease Management Educational Needs Care Coordination.   Review of patient status, including review of consultants reports, relevant laboratory and other test results, and collaboration with appropriate care team members and the patient's provider was performed as part of comprehensive patient evaluation and provision of care management services.    SDOH (Social Determinants of Health) assessments performed: Yes See Care Plan activities for detailed interventions related to SDOH)  SDOH Interventions     Most Recent Value  SDOH Interventions  SDOH Interventions for the Following Domains  Physical Activity, Stress, Depression, Social Connections, Pharmacist, community Strain Interventions  Other (Comment) [careguide referral for charity care and resources]  Physical Activity Interventions  Other (Comments) [Unable to do activity due to chronic pain and limited mobility]  Stress Interventions  Provide Counseling, Other (Comment) [LCSW referral and the patient has a psychiatrist she sees]  Social Connections Interventions  Other (Comment) [has a good support system- limited in ability to go to places due to health]  Depression Interventions/Treatment   Medication, Counseling, Currently on Treatment [LCSW referral and also sees psychiatrist]       Outpatient Encounter Medications as of 11/14/2019  Medication Sig  . ABILIFY MAINTENA 400 MG PRSY INTRAMUSCULAR EVERY 4 WEEKS  . acetaminophen (TYLENOL) 500 MG tablet Take 1,000 mg by mouth every  4 (four) hours as needed for headache.  . albuterol (VENTOLIN HFA) 108 (90 Base) MCG/ACT inhaler INHALE 2 PUFFS INTO LUNGS EVERY 4 HOURS AS NEEDED FOR WHEEZING OR SHORTNESS OF BREATH (COUGH).  Marland Kitchen ALPRAZolam (XANAX) 1 MG tablet Take 1 mg by mouth at bedtime as needed for anxiety.  Marland Kitchen amphetamine-dextroamphetamine (ADDERALL) 30 MG tablet Take 30 mg by mouth daily.  . clonazePAM (KLONOPIN) 0.5 MG tablet Take 0.5 mg by mouth 5 (five) times daily.  . cyclobenzaprine (FLEXERIL) 10 MG tablet TAKE ONE TABLET BY MOUTH THREE TIMES A DAY AS NEEDED FOR MUSCLE SPASMS  . fexofenadine (ALLEGRA) 180 MG tablet Take 180 mg by mouth daily as needed for allergies or rhinitis.  . furosemide (LASIX) 20 MG tablet TAKE 1 TAB BY MOUTH EVERY DAY FOR SWELLING, MAY INCREASE TO 2 TABS IF NEED FOR MAX 7 DAYS PER FLARE  . lamoTRIgine (LAMICTAL) 100 MG tablet Take 200 mg by mouth 2 (two) times daily.   . melatonin 3 MG TABS tablet Take 3 mg by mouth at bedtime.  . meloxicam (MOBIC) 15 MG tablet TAKE ONE TABLET BY MOUTH DAILY  . omeprazole (PRILOSEC) 20 MG capsule Take 1 capsule (20 mg total) by mouth daily as needed.  . Triamcinolone Acetonide (NASACORT ALLERGY 24HR NA) Place into the nose daily as needed.  . fluconazole (DIFLUCAN) 150 MG tablet Take one tablet by mouth on Day 1. Repeat dose 2nd tablet on Day 3.  . nitrofurantoin, macrocrystal-monohydrate, (MACROBID) 100 MG capsule Take 1 capsule (100 mg total) by mouth 2 (two) times daily. X 7 days   No facility-administered encounter medications on file as of 11/14/2019.    Goals Addressed  This Visit's Progress   . RNCM: pt-"Most days I have to stay in bed due to the chronic pain I have" (pt-stated)       CARE PLAN ENTRY (see longtitudinal plan of care for additional care plan information)  Current Barriers:  . Chronic Disease Management support, education, and care coordination needs related to HLD, Anxiety, Depression, Bipolar Disorder, and Chronic  pain . Financial constraints . ADL/IADL limitations  Clinical Goal(s) related to HLD, Anxiety, Depression, Bipolar Disorder, and chronic pain :  Over the next 120 days, patient will:  . Work with the care management team to address educational, disease management, and care coordination needs  . Begin or continue self health monitoring activities as directed today  adhere to a heart healthy diet and utilize coping mechanisms for depression, anxiety, and bipolar episodes  . Call provider office for new or worsened signs and symptoms Shortness of breath and New or worsened symptom related to swelling in legs, anxiety, depression and biploar  . Call care management team with questions or concerns . Verbalize basic understanding of patient centered plan of care established today  Interventions related to HLD, Anxiety, Depression, Bipolar Disorder, and Chronic pain  :  . Evaluation of current treatment plans and patient's adherence to plan as established by provider.  The patient is wanting to make positive changes in her life that will help her improve her health and well being.  . Assessed patient understanding of disease states.  The patient has a good understanding of her chronic conditions. She sees pcp and psychiatrist.  Willing to work with the CCM team to help with the chronic conditions she has. . Assessed patient's education and care coordination needs.  The patient is unable to work due to her health. She needs help with filing for disability and charity care so she can be seen by orthopedic provider.  The patient also needs dental work. Care guide referral for assistance in applying for disability, charity care and dental resources in [place].  . Provided disease specific education to patient.  Education on the benefits of Heart healthy diet.  The patient is ready to make dietary changes to help with weight reduction, decrease swelling in her legs, and manage her HLD more effectively.   Will send education material through the My chart system and EMMI for heart healthy diet.  . Assessed the patients pain level today. She rates her pain at a 4.  The patient says the pain is unmanageable at times and she has to stay in bed all  day. She is very limited in her mobility.  The patient is open to ideas and suggestions to help with pain control.   . Assessed the patient's  new complaint of headaches since last Friday.  The patient states they have been "really bad".  The patient sees pcp tomorrow and will discuss with pcp this new concern.  [name] with appropriate clinical care team members regarding patient needs . Referral placed for pharmacist: [facility] and any recommendations for improved help and well being/pain control . Referral for LCSW: The patient has a long standing history of anxiety, depression, and bipolar disorder. The patient also needs assistance and guidance on charity care. The patient has a psychiatrist but welcomes any support and help in dealing with her conditions. She has a significant other, "[name]", that is very active in her care and a great support to the patient.  . Assessed and reviewed medications with the patient . The  patient is compliant with current medication regimen. Updated list today.   Patient Self Care Activities related to HLD, Anxiety, Depression, Bipolar Disorder, and Chronic pain :  . Patient is unable to independently self-manage chronic health conditions  Initial goal documentation         Follow up plan:  Telephone follow up appointment with care management team member scheduled for: 12-23-2019 at 33 am   Ms. Shevchenko was given information about Care Management services today including:  1. Care Management services include personalized support from designated clinical staff supervised by a physician, including individualized plan of care and coordination with other care providers 2. 24/7 contact phone numbers for assistance  for urgent and routine care needs. 3. The patient may stop Care Management services at any time (effective at the end of the month) by phone call to the office staff.  Patient agreed to services and verbal consent obtained.  Noreene Larsson RN, MSN, [place] Mobile: [phone]

## 2019-11-14 NOTE — Patient Instructions (Signed)
Visit Information  Goals Addressed            This Visit's Progress   . RNCM: pt-"Most days I have to stay in bed due to the chronic pain I have" (pt-stated)       CARE PLAN ENTRY (see longtitudinal plan of care for additional care plan information)  Current Barriers:  . Chronic Disease Management support, education, and care coordination needs related to HLD, Anxiety, Depression, Bipolar Disorder, and Chronic pain . Financial constraints . ADL/IADL limitations  Clinical Goal(s) related to HLD, Anxiety, Depression, Bipolar Disorder, and chronic pain :  Over the next 120 days, patient will:  . Work with the care management team to address educational, disease management, and care coordination needs  . Begin or continue self health monitoring activities as directed today  adhere to a heart healthy diet and utilize coping mechanisms for depression, anxiety, and bipolar episodes  . Call provider office for new or worsened signs and symptoms Shortness of breath and New or worsened symptom related to swelling in legs, anxiety, depression and biploar  . Call care management team with questions or concerns . Verbalize basic understanding of patient centered plan of care established today  Interventions related to HLD, Anxiety, Depression, Bipolar Disorder, and Chronic pain  :  . Evaluation of current treatment plans and patient's adherence to plan as established by provider.  The patient is wanting to make positive changes in her life that will help her improve her health and well being.  . Assessed patient understanding of disease states.  The patient has a good understanding of her chronic conditions. She sees pcp and psychiatrist.  Willing to work with the CCM team to help with the chronic conditions she has. . Assessed patient's education and care coordination needs.  The patient is unable to work due to her health. She needs help with filing for disability and charity care so she can be seen  by orthopedic provider.  The patient also needs dental work. Care guide referral for assistance in applying for disability, charity care and dental resources in [place].  . Provided disease specific education to patient.  Education on the benefits of Heart healthy diet.  The patient is ready to make dietary changes to help with weight reduction, decrease swelling in her legs, and manage her HLD more effectively.  Will send education material through the My chart system and EMMI for heart healthy diet.  . Assessed the patients pain level today. She rates her pain at a 4.  The patient says the pain is unmanageable at times and she has to stay in bed all  day. She is very limited in her mobility.  The patient is open to ideas and suggestions to help with pain control.   . Assessed the patient's  new complaint of headaches since last Friday.  The patient states they have been "really bad".  The patient sees pcp tomorrow and will discuss with pcp this new concern.  [name] with appropriate clinical care team members regarding patient needs . Referral placed for pharmacist: [facility] and any recommendations for improved help and well being/pain control . Referral for LCSW: The patient has a long standing history of anxiety, depression, and bipolar disorder. The patient also needs assistance and guidance on charity care. The patient has a psychiatrist but welcomes any support and help in dealing with her conditions. She has a significant other, "Maria Jacobson", that is very active in her care and a great  support to the patient.  . Assessed and reviewed medications with the patient . The patient is compliant with current medication regimen. Updated list today.   Patient Self Care Activities related to HLD, Anxiety, Depression, Bipolar Disorder, and Chronic pain :  . Patient is unable to independently self-manage chronic health conditions  Initial goal documentation        Maria Jacobson was given  information about Care Management services today including:  1. Care Management services include personalized support from designated clinical staff supervised by her physician, including individualized plan of care and coordination with other care providers 2. 24/7 contact phone numbers for assistance for urgent and routine care needs. 3. The patient may stop CCM services at any time (effective at the end of the month) by phone call to the office staff.  Patient agreed to services and verbal consent obtained.   Patient verbalizes understanding of instructions provided today.   Telephone follow up appointment with care management team member scheduled for: 12-23-2019 at 0930 am  Maria Larsson RN, MSN, [facility] [place]: [phone]   Living With Depression Everyone experiences occasional disappointment, sadness, and loss in their lives. When you are feeling down, blue, or sad for at least 2 weeks in a row, it may mean that you have depression. Depression can affect your thoughts and feelings, relationships, daily activities, and physical health. It is caused by changes in the way your brain functions. If you receive a diagnosis of depression, your health care provider will tell you which type of depression you have and what treatment options are available to you. If you are living with depression, there are ways to help you recover from it and also ways to prevent it from coming back. How to cope with lifestyle changes Coping with stress     Stress is your body's reaction to life changes and events, both good and bad. Stressful situations may include:  Getting married.  The death of a spouse.  Losing a job.  Retiring.  Having a baby. Stress can last just a few hours or it can be ongoing. Stress can play a major role in depression, so it is important to learn both how to cope with stress and how to  think about it differently. Talk with your health care provider or a counselor if you would like to learn more about stress reduction. He or she may suggest some stress reduction techniques, such as:  Music therapy. This can include creating music or listening to music. Choose music that you enjoy and that inspires you.  Mindfulness-based meditation. This kind of meditation can be done while sitting or walking. It involves being aware of your normal breaths, rather than trying to control your breathing.  Centering prayer. This is a kind of meditation that involves focusing on a spiritual word or phrase. Choose a word, phrase, or sacred image that is meaningful to you and that brings you peace.  Deep breathing. To do this, expand your stomach and inhale slowly through your nose. Hold your breath for 3-5 seconds, then exhale slowly, allowing your stomach muscles to relax.  Muscle relaxation. This involves intentionally tensing muscles then relaxing them. Choose a stress reduction technique that fits your lifestyle and personality. Stress reduction techniques take time and practice to develop. Set aside 5-15 minutes a day to do them. Therapists can offer training in these techniques. The training may be covered by some insurance  plans. Other things you can do to manage stress include:  Keeping a stress diary. This can help you learn what triggers your stress and ways to control your response.  Understanding what your limits are and saying no to requests or events that lead to a schedule that is too full.  Thinking about how you respond to certain situations. You may not be able to control everything, but you can control how you react.  Adding humor to your life by watching funny films or TV shows.  Making time for activities that help you relax and not feeling guilty about spending your time this way.  Medicines Your health care provider may suggest certain medicines if he or she feels that  they will help improve your condition. Avoid using alcohol and other substances that may prevent your medicines from working properly (may interact). It is also important to:  Talk with your pharmacist or health care provider about all the medicines that you take, their possible side effects, and what medicines are safe to take together.  Make it your goal to take part in all treatment decisions (shared decision-making). This includes giving input on the side effects of medicines. It is best if shared decision-making with your health care provider is part of your total treatment plan. If your health care provider prescribes a medicine, you may not notice the full benefits of it for 4-8 weeks. Most people who are treated for depression need to be on medicine for at least 6-12 months after they feel better. If you are taking medicines as part of your treatment, do not stop taking medicines without first talking to your health care provider. You may need to have the medicine slowly decreased (tapered) over time to decrease the risk of harmful side effects. Relationships Your health care provider may suggest family therapy along with individual therapy and drug therapy. While there may not be family problems that are causing you to feel depressed, it is still important to make sure your family learns as much as they can about your mental health. Having your family's support can help make your treatment successful. How to recognize changes in your condition Everyone has a different response to treatment for depression. Recovery from major depression happens when you have not had signs of major depression for two months. This may mean that you will start to:  Have more interest in doing activities.  Feel less hopeless than you did 2 months ago.  Have more energy.  Overeat less often, or have better or improving appetite.  Have better concentration. Your health care provider will work with you to decide  the next steps in your recovery. It is also important to recognize when your condition is getting worse. Watch for these signs:  Having fatigue or low energy.  Eating too much or too little.  Sleeping too much or too little.  Feeling restless, agitated, or hopeless.  Having trouble concentrating or making decisions.  Having unexplained physical complaints.  Feeling irritable, angry, or aggressive. Get help as soon as you or your family members notice these symptoms coming back. How to get support and help from others How to talk with friends and family members about your condition  Talking to friends and family members about your condition can provide you with one way to get support and guidance. Reach out to trusted friends or family members, explain your symptoms to them, and let them know that you are working with a health care provider to  treat your depression. Financial resources Not all insurance plans cover mental health care, so it is important to check with your insurance carrier. If paying for co-pays or counseling services is a problem, search for a local or county mental health care center. They may be able to offer public mental health care services at low or no cost when you are not able to see a private health care provider. If you are taking medicine for depression, you may be able to get the generic form, which may be less expensive. Some makers of prescription medicines also offer help to patients who cannot afford the medicines they need. Follow these instructions at home:   Get the right amount and quality of sleep.  Cut down on using caffeine, tobacco, alcohol, and other potentially harmful substances.  Try to exercise, such as walking or lifting small weights.  Take over-the-counter and prescription medicines only as told by your health care provider.  Eat a healthy diet that includes plenty of vegetables, fruits, whole grains, low-fat dairy products, and lean  protein. Do not eat a lot of foods that are high in solid fats, added sugars, or salt.  Keep all follow-up visits as told by your health care provider. This is important. Contact a health care provider if:  You stop taking your antidepressant medicines, and you have any of these symptoms: ? Nausea. ? Headache. ? Feeling lightheaded. ? Chills and body aches. ? Not being able to sleep (insomnia).  You or your friends and family think your depression is getting worse. Get help right away if:  You have thoughts of hurting yourself or others. If you ever feel like you may hurt yourself or others, or have thoughts about taking your own life, get help right away. You can go to your nearest emergency department or call:  Your local emergency services (911 in the [place]  A suicide crisis helpline, such as the [facility] at (920) 134-2492. This is open 24-hours a day. Summary  If you are living with depression, there are ways to help you recover from it and also ways to prevent it from coming back.  Work with your health care team to create a management plan that includes counseling, stress management techniques, and healthy lifestyle habits. This information is not intended to replace advice given to you by your health care provider. Make sure you discuss any questions you have with your health care provider. Document Revised: 11/09/2018 Document Reviewed: 06/20/2016 Elsevier Patient Education  [place].  Cholesterol Content in Foods Cholesterol is a waxy, fat-like substance that helps to carry fat in the blood. The body needs cholesterol in small amounts, but too much cholesterol can cause damage to the arteries and heart. Most people should eat less than 200 milligrams (mg) of cholesterol a day. Foods with cholesterol  Cholesterol is found in animal-based foods, such as meat, seafood, and dairy. Generally, low-fat dairy and lean meats have less  cholesterol than full-fat dairy and fatty meats. The milligrams of cholesterol per serving (mg per serving) of common cholesterol-containing foods are listed below. Meat and other proteins  Egg -- one large whole egg has 186 mg.  Veal shank -- 4 oz has 141 mg.  Lean ground [place] (93% lean) -- 4 oz has 118 mg.  Fat-trimmed lamb loin -- 4 oz has 106 mg.  Lean ground beef (90% lean) -- 4 oz has 100 mg.  Lobster -- 3.5 oz has 90 mg.  Pork loin chops --  4 oz has 86 mg.  Canned salmon -- 3.5 oz has 83 mg.  Fat-trimmed beef top loin -- 4 oz has 78 mg.  Frankfurter -- 1 frank (3.5 oz) has 77 mg.  Crab -- 3.5 oz has 71 mg.  Roasted chicken without skin, white meat -- 4 oz has 66 mg.  Light bologna -- 2 oz has 45 mg.  Deli-cut [place] -- 2 oz has 31 mg.  Canned tuna -- 3.5 oz has 31 mg.  Berniece Salines -- 1 oz has 29 mg.  Oysters and mussels (raw) -- 3.5 oz has 25 mg.  Mackerel -- 1 oz has 22 mg.  Trout -- 1 oz has 20 mg.  Pork sausage -- 1 link (1 oz) has 17 mg.  Salmon -- 1 oz has 16 mg.  Tilapia -- 1 oz has 14 mg. Dairy  Soft-serve ice cream --  cup (4 oz) has 103 mg.  Whole-milk yogurt -- 1 cup (8 oz) has 29 mg.  Cheddar cheese -- 1 oz has 28 mg.  American cheese -- 1 oz has 28 mg.  Whole milk -- 1 cup (8 oz) has 23 mg.  2% milk -- 1 cup (8 oz) has 18 mg.  Cream cheese -- 1 tablespoon (Tbsp) has 15 mg.  Cottage cheese --  cup (4 oz) has 14 mg.  Low-fat (1%) milk -- 1 cup (8 oz) has 10 mg.  Sour cream -- 1 Tbsp has 8.5 mg.  Low-fat yogurt -- 1 cup (8 oz) has 8 mg.  Nonfat Greek yogurt -- 1 cup (8 oz) has 7 mg.  Half-and-half cream -- 1 Tbsp has 5 mg. Fats and oils  Cod liver oil -- 1 tablespoon (Tbsp) has 82 mg.  Butter -- 1 Tbsp has 15 mg.  Lard -- 1 Tbsp has 14 mg.  Bacon grease -- 1 Tbsp has 14 mg.  Mayonnaise -- 1 Tbsp has 5-10 mg.  Margarine -- 1 Tbsp has 3-10 mg. Exact amounts of cholesterol in these foods may vary depending on specific  ingredients and brands. Foods without cholesterol Most plant-based foods do not have cholesterol unless you combine them with a food that has cholesterol. Foods without cholesterol include:  Grains and cereals.  Vegetables.  Fruits.  Vegetable oils, such as olive, canola, and sunflower oil.  Legumes, such as peas, beans, and lentils.  Nuts and seeds.  Egg whites. Summary  The body needs cholesterol in small amounts, but too much cholesterol can cause damage to the arteries and heart.  Most people should eat less than 200 milligrams (mg) of cholesterol a day. This information is not intended to replace advice given to you by your health care provider. Make sure you discuss any questions you have with your health care provider. Document Revised: 06/30/2017 Document Reviewed: 03/14/2017 Elsevier Patient Education  [place] DASH stands for "Dietary Approaches to Stop Hypertension." The DASH eating plan is a healthy eating plan that has been shown to reduce high blood pressure (hypertension). It may also reduce your risk for type 2 diabetes, heart disease, and stroke. The DASH eating plan may also help with weight loss. What are tips for following this plan?  General guidelines  Avoid eating more than 2,300 mg (milligrams) of salt (sodium) a day. If you have hypertension, you may need to reduce your sodium intake to 1,500 mg a day.  Limit alcohol intake to no more than 1 drink a day for nonpregnant women and 2  drinks a day for men. One drink equals 12 oz of beer, 5 oz of wine, or 1 oz of hard liquor.  Work with your health care provider to maintain a healthy body weight or to lose weight. Ask what an ideal weight is for you.  Get at least 30 minutes of exercise that causes your heart to beat faster (aerobic exercise) most days of the week. Activities may include walking, swimming, or biking.  Work with your health care provider or diet and nutrition  specialist (dietitian) to adjust your eating plan to your individual calorie needs. Reading food labels   Check food labels for the amount of sodium per serving. Choose foods with less than 5 percent of the Daily Value of sodium. Generally, foods with less than 300 mg of sodium per serving fit into this eating plan.  To find whole grains, look for the word "whole" as the first word in the ingredient list. Shopping  Buy products labeled as "low-sodium" or "no salt added."  Buy fresh foods. Avoid canned foods and premade or frozen meals. Cooking  Avoid adding salt when cooking. Use salt-free seasonings or herbs instead of table salt or sea salt. Check with your health care provider or pharmacist before using salt substitutes.  Do not fry foods. Cook foods using healthy methods such as baking, boiling, grilling, and broiling instead.  Cook with heart-healthy oils, such as olive, canola, soybean, or sunflower oil. Meal planning  Eat a balanced diet that includes: ? 5 or more servings of fruits and vegetables each day. At each meal, try to fill half of your plate with fruits and vegetables. ? Up to 6-8 servings of whole grains each day. ? Less than 6 oz of lean meat, poultry, or fish each day. A 3-oz serving of meat is about the same size as a deck of cards. One egg equals 1 oz. ? 2 servings of low-fat dairy each day. ? A serving of nuts, seeds, or beans 5 times each week. ? Heart-healthy fats. Healthy fats called Omega-3 fatty acids are found in foods such as flaxseeds and coldwater fish, like sardines, salmon, and mackerel.  Limit how much you eat of the following: ? Canned or prepackaged foods. ? Food that is high in trans fat, such as fried foods. ? Food that is high in saturated fat, such as fatty meat. ? Sweets, desserts, sugary drinks, and other foods with added sugar. ? Full-fat dairy products.  Do not salt foods before eating.  Try to eat at least 2 vegetarian meals each  week.  Eat more home-cooked food and less restaurant, buffet, and fast food.  When eating at a restaurant, ask that your food be prepared with less salt or no salt, if possible. What foods are recommended? The items listed may not be a complete list. Talk with your dietitian about what dietary choices are best for you. Grains Whole-grain or whole-wheat bread. Whole-grain or whole-wheat pasta. Brown rice. [place]. Bulgur. Whole-grain and low-sodium cereals. Pita bread. Low-fat, low-sodium crackers. Whole-wheat flour tortillas. Vegetables Fresh or frozen vegetables (raw, steamed, roasted, or grilled). Low-sodium or reduced-sodium tomato and vegetable juice. Low-sodium or reduced-sodium tomato sauce and tomato paste. Low-sodium or reduced-sodium canned vegetables. Fruits All fresh, dried, or frozen fruit. Canned fruit in natural juice (without added sugar). Meat and other protein foods Skinless chicken or [place]. Ground chicken or [place]. Pork with fat trimmed off. Fish and seafood. Egg whites. Dried beans, peas, or lentils. Unsalted nuts, nut butters,  and seeds. Unsalted canned beans. Lean cuts of beef with fat trimmed off. Low-sodium, lean deli meat. Dairy Low-fat (1%) or fat-free (skim) milk. Fat-free, low-fat, or reduced-fat cheeses. Nonfat, low-sodium ricotta or cottage cheese. Low-fat or nonfat yogurt. Low-fat, low-sodium cheese. Fats and oils Soft margarine without trans fats. Vegetable oil. Low-fat, reduced-fat, or light mayonnaise and salad dressings (reduced-sodium). Canola, safflower, olive, soybean, and sunflower oils. Avocado. Seasoning and other foods Herbs. Spices. Seasoning mixes without salt. Unsalted popcorn and pretzels. Fat-free sweets. What foods are not recommended? The items listed may not be a complete list. Talk with your dietitian about what dietary choices are best for you. Grains Baked goods made with fat, such as croissants, muffins, or some breads. Dry pasta  or rice meal packs. Vegetables Creamed or fried vegetables. Vegetables in a cheese sauce. Regular canned vegetables (not low-sodium or reduced-sodium). Regular canned tomato sauce and paste (not low-sodium or reduced-sodium). Regular tomato and vegetable juice (not low-sodium or reduced-sodium). Angie Fava. Olives. Fruits Canned fruit in a light or heavy syrup. Fried fruit. Fruit in cream or butter sauce. Meat and other protein foods Fatty cuts of meat. Ribs. Fried meat. Berniece Salines. Sausage. Bologna and other processed lunch meats. Salami. Fatback. Hotdogs. Bratwurst. Salted nuts and seeds. Canned beans with added salt. Canned or smoked fish. Whole eggs or egg yolks. Chicken or [place] with skin. Dairy Whole or 2% milk, cream, and half-and-half. Whole or full-fat cream cheese. Whole-fat or sweetened yogurt. Full-fat cheese. Nondairy creamers. Whipped toppings. Processed cheese and cheese spreads. Fats and oils Butter. Stick margarine. Lard. Shortening. Ghee. Bacon fat. Tropical oils, such as coconut, palm kernel, or palm oil. Seasoning and other foods Salted popcorn and pretzels. Onion salt, garlic salt, seasoned salt, table salt, and sea salt. Worcestershire sauce. Tartar sauce. Barbecue sauce. Teriyaki sauce. Soy sauce, including reduced-sodium. Steak sauce. Canned and packaged gravies. Fish sauce. Oyster sauce. Cocktail sauce. Horseradish that you find on the shelf. Ketchup. Mustard. Meat flavorings and tenderizers. Bouillon cubes. Hot sauce and Tabasco sauce. Premade or packaged marinades. Premade or packaged taco seasonings. Relishes. Regular salad dressings. Where to find more information:  National Heart, Lung, and [place]: https://wilson-eaton.com/  American Heart Association: www.heart.org Summary  The DASH eating plan is a healthy eating plan that has been shown to reduce high blood pressure (hypertension). It may also reduce your risk for type 2 diabetes, heart disease, and stroke.  With the  DASH eating plan, you should limit salt (sodium) intake to 2,300 mg a day. If you have hypertension, you may need to reduce your sodium intake to 1,500 mg a day.  When on the DASH eating plan, aim to eat more fresh fruits and vegetables, whole grains, lean proteins, low-fat dairy, and heart-healthy fats.  Work with your health care provider or diet and nutrition specialist (dietitian) to adjust your eating plan to your individual calorie needs. This information is not intended to replace advice given to you by your health care provider. Make sure you discuss any questions you have with your health care provider. Document Revised: 06/30/2017 Document Reviewed: 07/11/2016 Elsevier Patient Education  2020 [employer].

## 2019-11-15 ENCOUNTER — Encounter: Payer: Self-pay | Admitting: Family Medicine

## 2019-11-15 ENCOUNTER — Other Ambulatory Visit: Payer: Self-pay

## 2019-11-15 ENCOUNTER — Ambulatory Visit: Payer: Self-pay | Admitting: Family Medicine

## 2019-11-15 VITALS — BP 124/86 | HR 102 | Temp 97.7°F | Resp 16 | Ht 69.0 in | Wt 368.6 lb

## 2019-11-15 DIAGNOSIS — Z6841 Body Mass Index (BMI) 40.0 and over, adult: Secondary | ICD-10-CM

## 2019-11-15 DIAGNOSIS — Z1612 Extended spectrum beta lactamase (ESBL) resistance: Secondary | ICD-10-CM

## 2019-11-15 DIAGNOSIS — T3695XA Adverse effect of unspecified systemic antibiotic, initial encounter: Secondary | ICD-10-CM

## 2019-11-15 DIAGNOSIS — M25562 Pain in left knee: Secondary | ICD-10-CM

## 2019-11-15 DIAGNOSIS — R8290 Unspecified abnormal findings in urine: Secondary | ICD-10-CM

## 2019-11-15 DIAGNOSIS — N3000 Acute cystitis without hematuria: Secondary | ICD-10-CM

## 2019-11-15 DIAGNOSIS — M5441 Lumbago with sciatica, right side: Secondary | ICD-10-CM

## 2019-11-15 DIAGNOSIS — B9629 Other Escherichia coli [E. coli] as the cause of diseases classified elsewhere: Secondary | ICD-10-CM

## 2019-11-15 DIAGNOSIS — G8929 Other chronic pain: Secondary | ICD-10-CM

## 2019-11-15 DIAGNOSIS — N390 Urinary tract infection, site not specified: Secondary | ICD-10-CM

## 2019-11-15 DIAGNOSIS — K219 Gastro-esophageal reflux disease without esophagitis: Secondary | ICD-10-CM

## 2019-11-15 DIAGNOSIS — B379 Candidiasis, unspecified: Secondary | ICD-10-CM

## 2019-11-15 LAB — POCT URINALYSIS DIPSTICK
Bilirubin, UA: NEGATIVE
Blood, UA: POSITIVE
Glucose, UA: NEGATIVE
Ketones, UA: NEGATIVE
Leukocytes, UA: NEGATIVE
Nitrite, UA: NEGATIVE
Protein, UA: NEGATIVE
Spec Grav, UA: 1.01 (ref 1.010–1.025)
Urobilinogen, UA: 0.2 E.U./dL
pH, UA: 5 (ref 5.0–8.0)

## 2019-11-15 MED ORDER — SULFAMETHOXAZOLE-TRIMETHOPRIM 800-160 MG PO TABS: 1.0000 | ORAL_TABLET | Freq: Two times a day (BID) | ORAL | 0 refills | Status: AC

## 2019-11-15 MED ORDER — CYCLOBENZAPRINE HCL 10 MG PO TABS: 10.0000 mg | ORAL_TABLET | Freq: Three times a day (TID) | ORAL | 2 refills | Status: DC | PRN

## 2019-11-15 MED ORDER — MELOXICAM 15 MG PO TABS: 15.0000 mg | ORAL_TABLET | Freq: Every day | ORAL | 2 refills | Status: DC

## 2019-11-15 MED ORDER — OMEPRAZOLE 20 MG PO CPDR: 20.0000 mg | DELAYED_RELEASE_CAPSULE | Freq: Every day | ORAL | 2 refills | Status: DC | PRN

## 2019-11-15 MED ORDER — FLUCONAZOLE 150 MG PO TABS: ORAL_TABLET | ORAL | 0 refills | Status: DC

## 2019-11-15 NOTE — Progress Notes (Signed)
Subjective:    Patient ID: Maria Jacobson, female    DOB: 1976/04/15, 44 y.o.   MRN: [id number]  Maria Jacobson is a 44 y.o. female presenting on 11/15/2019 for Annual Exam and Urinary Tract Infection (Foul smell same like last month)   HPI   Elevated A1c Lab showed A1c 5.6, no prior elevated readings, prior 5.4, not with history of DM or PreDM  HYPERLIPIDEMIA: - Reports concerns. Last lipid panel 10/2019 , elevated LDL >150, no prior abnormal Not on cholesterol medicine.  Vertigo Admits 5-10 second dizziness, worse with position changes especially laying down.  Chronic Knee Pain / Osteoarthritis - see prior notes No acute pain today Quality of life has decreased due to joint pain and weight gain, reduced mobility Bipolar flare worse at times in 2020, following with Psych She is working with Nurse Case Manager now for charity care applications  UTI Last UTI similarly was in February 2021, treated with Keflex then resulted with ESBL E Coli, switched after 2 days to [facility], did resolve but now returned about 2 months later. X 2 weeks now with urinary odor, without dysuria or hematuria fever chills    Health Maintenance:  She will work on scheduling Mammogram through scholarship program and also may schedule pap smear through [facility] program.   Depression screen [facility] 2/9 11/14/2019 02/08/2019 01/30/2019  Decreased Interest 2 0 1  Down, Depressed, Hopeless 3 0 2  PHQ - 2 Score 5 0 3  Altered sleeping 1 0 3  Tired, decreased energy 3 0 3  Change in appetite 1 0 -  Feeling bad or failure about yourself  3 0 1  Trouble concentrating 3 0 3  Moving slowly or fidgety/restless 0 0 0  Suicidal thoughts 0 0 1  PHQ-9 Score 16 0 14  Difficult doing work/chores Extremely dIfficult Not difficult at all Not difficult at all    Past Medical History:  Diagnosis Date  . Anxiety    pt is seen by psych  . Bipolar 1 disorder ([place])   . Bipolar affective disorder, depressed, severe ([place])   .  Depression    Past Surgical History:  Procedure Laterality Date  . BREAST BIOPSY Right   . DILATION AND CURETTAGE OF UTERUS     Social History   Socioeconomic History  . Marital status: Significant Other    Spouse name: Not on file  . Number of children: Not on file  . Years of education: Not on file  . Highest education level: Not on file  Occupational History  . Occupation: Commentary Contractor    Comment: Travels to [place] [employer] often  Tobacco Use  . Smoking status: Never Smoker  . Smokeless tobacco: Never Used  Substance and Sexual Activity  . Alcohol use: Not Currently    Comment: rarely  . Drug use: No  . Sexual activity: Yes    Birth control/protection: I.U.D.  Other Topics Concern  . Not on file  Social History Narrative  . Not on file   Social Determinants of Health   Financial Resource Strain: Medium Risk  . Difficulty of Paying Living Expenses: Somewhat hard  Food Insecurity: No Food Insecurity  . Worried About Charity fundraiser in the Last Year: Never true  . Ran Out of Food in the Last Year: Never true  Transportation Needs: No Transportation Needs  . Lack of Transportation (Medical): No  . Lack of Transportation (Non-Medical): No  Physical Activity: Inactive  . Days of Exercise per  Week: 0 days  . Minutes of Exercise per Session: 0 min  Stress: Stress Concern Present  . Feeling of Stress : Very much  Social Connections: Somewhat Isolated  . Frequency of Communication with Friends and Family: More than three times a week  . Frequency of Social Gatherings with Friends and Family: More than three times a week  . Attends Religious Services: Never  . Active Member of Clubs or Organizations: No  . Attends Archivist Meetings: Never  . Marital Status: Living with partner  Intimate Partner Violence: Not At Risk  . Fear of Current or Ex-Partner: No  . Emotionally Abused: No  . Physically Abused: No  . Sexually Abused: No   Family History   Problem Relation Age of Onset  . Thyroid disease Mother   . Rheum arthritis Mother   . Gout Mother   . Kidney disease Mother   . Bipolar disorder Father   . Melanoma Paternal Uncle    Current Outpatient Medications on File Prior to Visit  Medication Sig  . ABILIFY MAINTENA 400 MG PRSY INTRAMUSCULAR EVERY 4 WEEKS  . acetaminophen (TYLENOL) 500 MG tablet Take 1,000 mg by mouth every 4 (four) hours as needed for headache.  . albuterol (VENTOLIN HFA) 108 (90 Base) MCG/ACT inhaler INHALE 2 PUFFS INTO LUNGS EVERY 4 HOURS AS NEEDED FOR WHEEZING OR SHORTNESS OF BREATH (COUGH).  Marland Kitchen ALPRAZolam (XANAX) 1 MG tablet Take 1 mg by mouth at bedtime as needed for anxiety.  Marland Kitchen amphetamine-dextroamphetamine (ADDERALL) 30 MG tablet Take 30 mg by mouth daily.  . clonazePAM (KLONOPIN) 0.5 MG tablet Take 0.5 mg by mouth 5 (five) times daily.  . fexofenadine (ALLEGRA) 180 MG tablet Take 180 mg by mouth daily as needed for allergies or rhinitis.  . furosemide (LASIX) 20 MG tablet TAKE 1 TAB BY MOUTH EVERY DAY FOR SWELLING, MAY INCREASE TO 2 TABS IF NEED FOR MAX 7 DAYS PER FLARE  . lamoTRIgine (LAMICTAL) 100 MG tablet Take 200 mg by mouth 2 (two) times daily.   . melatonin 3 MG TABS tablet Take 3 mg by mouth at bedtime.  . Triamcinolone Acetonide (NASACORT ALLERGY 24HR NA) Place into the nose daily as needed.   No current facility-administered medications on file prior to visit.    Review of Systems  Constitutional: Negative for activity change, appetite change, chills, diaphoresis, fatigue and fever.  HENT: Negative for congestion and hearing loss.   Eyes: Negative for visual disturbance.  Respiratory: Negative for cough, chest tightness, shortness of breath and wheezing.   Cardiovascular: Negative for chest pain, palpitations and leg swelling.  Gastrointestinal: Negative for abdominal pain, constipation, diarrhea, nausea and vomiting.  Genitourinary: Negative for dysuria, frequency and hematuria.        Urinary odor  Musculoskeletal: Positive for arthralgias, back pain, gait problem and joint swelling. Negative for neck pain.  Skin: Negative for rash.  Neurological: Negative for dizziness, weakness, light-headedness, numbness and headaches.  Hematological: Negative for adenopathy.  Psychiatric/Behavioral: Negative for behavioral problems, dysphoric mood and sleep disturbance.   Per HPI unless specifically indicated above      Objective:    BP 124/86   Pulse (!) 102   Temp 97.7 F (36.5 C) (Temporal)   Resp 16   Ht 5' 9"  (1.753 m)   Wt (!) 368 lb 9.6 oz (167.2 kg)   BMI 54.43 kg/m   Wt Readings from Last 3 Encounters:  11/15/19 (!) 368 lb 9.6 oz (167.2 kg)  09/25/19 Marland Kitchen)  376 lb (170.6 kg)  01/30/19 (!) 378 lb (171.5 kg)    Physical Exam Vitals and nursing note reviewed.  Constitutional:      General: She is not in acute distress.    Appearance: She is well-developed. She is obese. She is not diaphoretic.     Comments: Well-appearing, comfortable, cooperative  HENT:     Head: Normocephalic and atraumatic.  Eyes:     General:        Right eye: No discharge.        Left eye: No discharge.     Conjunctiva/sclera: Conjunctivae normal.     Pupils: Pupils are equal, round, and reactive to light.  Neck:     Thyroid: No thyromegaly.  Cardiovascular:     Rate and Rhythm: Normal rate and regular rhythm.     Heart sounds: Normal heart sounds. No murmur.  Pulmonary:     Effort: Pulmonary effort is normal. No respiratory distress.     Breath sounds: Normal breath sounds. No wheezing or rales.  Abdominal:     General: Bowel sounds are normal. There is no distension.     Palpations: Abdomen is soft. There is no mass.     Tenderness: There is no abdominal tenderness.  Musculoskeletal:        General: No tenderness.     Cervical back: Normal range of motion and neck supple.     Right lower leg: Edema (trace) present.     Left lower leg: Edema (trace) present.     Comments:  Upper / Lower Extremities: - Normal muscle tone, strength bilateral upper extremities 5/5, lower extremities 5/5  Lymphadenopathy:     Cervical: No cervical adenopathy.  Skin:    General: Skin is warm and dry.     Findings: No erythema or rash.  Neurological:     Mental Status: She is alert and oriented to person, place, and time.     Comments: Distal sensation intact to light touch all extremities  Psychiatric:        Behavior: Behavior normal.     Comments: Well groomed, good eye contact, normal speech and thoughts       Urine Culture Order: [id number] Status:  Final result Visible to patient:  Yes (MyChart) Next appt:  11/28/2019 at 01:15 PM in Internal Medicine ([place]) Dx:  Isaac Bliss smelling urine Specimen Information: Urine     Component 1 mo ago  MICRO NUMBER: [id number]   SPECIMEN QUALITY: Adequate   Sample Source URINE   STATUS: FINAL   ISOLATE 1: ESBL Escherichia coliAbnormal    Comment: Greater than 100,000 CFU/mL of Escherichia coli (ESBL)  Resulting Agency [facility]  Susceptibility   Esbl escherichia coli    URINE CULTURE, REFLEX    AMOX/CLAVULANIC 16  Intermediate    AMPICILLIN >=32  Resistant1    AMPICILLIN/SULBACTAM >=32  Resistant    CEFAZOLIN >=64  Resistant2    CEFEPIME 2  Resistant    CEFTRIAXONE >=64  Resistant    CIPROFLOXACIN >=4  Resistant    ERTAPENEM <=0.5  Sensitive    GENTAMICIN >=16  Resistant    IMIPENEM <=0.25  Sensitive    LEVOFLOXACIN >=8  Resistant    NITROFURANTOIN <=16  Sensitive    PIP/TAZO <=4  Sensitive    TOBRAMYCIN 8  Intermediate    TRIMETH/SULFA <=20  Sensitive3         1 Extended spectrum beta-lactamase (ESBL) producing  organisms demonstrate decreased activity with  penicillins,  cephalosporins and aztreonam.  2 For uncomplicated UTI caused by E. coli,  K. pneumoniae or P. mirabilis: Cefazolin is  susceptible if MIC <32 mcg/mL and predicts  susceptible to the oral agents cefaclor, cefdinir,  cefpodoxime,  cefprozil, cefuroxime, cephalexin  and loracarbef.  3 Legend:  S = Susceptible I = Intermediate  R = Resistant NS = Not susceptible  * = Not tested NR = Not reported  **NN = See antimicrobic comments        Specimen Collected: 09/25/19 11:27         Results for orders placed or performed in visit on 11/15/19  POCT Urinalysis Dipstick  Result Value Ref Range   Color, UA [place]    Clarity, UA clear    Glucose, UA Negative Negative   Bilirubin, UA Negative    Ketones, UA Negative    Spec Grav, UA 1.010 1.010 - 1.025   Blood, UA positive    pH, UA 5.0 5.0 - 8.0   Protein, UA Negative Negative   Urobilinogen, UA 0.2 0.2 or 1.0 E.U./dL   Nitrite, UA Negative    Leukocytes, UA Negative Negative   Appearance     Odor        Assessment & Plan:   Problem List Items Addressed This Visit    Morbid obesity with BMI of 50.0-59.9, adult (HCC) - Primary   Left medial knee pain   Relevant Medications   meloxicam (MOBIC) 15 MG tablet   GERD (gastroesophageal reflux disease)   Relevant Medications   omeprazole (PRILOSEC) 20 MG capsule   Chronic bilateral low back pain with right-sided sciatica   Relevant Medications   meloxicam (MOBIC) 15 MG tablet   cyclobenzaprine (FLEXERIL) 10 MG tablet    Other Visit Diagnoses    Foul smelling urine       Relevant Orders   POCT Urinalysis Dipstick (Completed)   Acute cystitis without hematuria       Relevant Medications   sulfamethoxazole-trimethoprim (BACTRIM DS) 800-160 MG tablet   Other Relevant Orders   Urine Culture   Antibiotic-induced yeast infection       Relevant Medications   fluconazole (DIFLUCAN) 150 MG tablet   sulfamethoxazole-trimethoprim (BACTRIM DS) 800-160 MG tablet   UTI due to extended-spectrum beta lactamase (ESBL) producing Escherichia coli       Relevant Medications   fluconazole (DIFLUCAN) 150 MG tablet   sulfamethoxazole-trimethoprim (BACTRIM DS) 800-160 MG tablet   Other Relevant Orders   Urine Culture       Updated Health Maintenance information - BCCP program mammogram/pap due Reviewed recent lab results with patient Encouraged improvement to lifestyle with diet and exercise - Goal of weight loss  #Joint pain / osteoarthritis Pursue charity care application through CCM assistance, may benefit from orthopedic specialist  #Urinary odor Check urine Send for culture again Trial on Bactrim, last time ESBL E Coli UTI Diflucan   Meds ordered this encounter  Medications  . fluconazole (DIFLUCAN) 150 MG tablet    Sig: Take one tablet by mouth on Day 1. Repeat dose 2nd tablet on Day 3.    Dispense:  2 tablet    Refill:  0  . sulfamethoxazole-trimethoprim (BACTRIM DS) 800-160 MG tablet    Sig: Take 1 tablet by mouth 2 (two) times daily for 7 days.    Dispense:  14 tablet    Refill:  0  . omeprazole (PRILOSEC) 20 MG capsule    Sig: Take 1 capsule (20 mg total) by mouth daily  as needed.    Dispense:  30 capsule    Refill:  2  . meloxicam (MOBIC) 15 MG tablet    Sig: Take 1 tablet (15 mg total) by mouth daily.    Dispense:  30 tablet    Refill:  2  . cyclobenzaprine (FLEXERIL) 10 MG tablet    Sig: Take 1 tablet (10 mg total) by mouth 3 (three) times daily as needed for muscle spasms.    Dispense:  90 tablet    Refill:  2      Follow up plan: Return in about 3 months (around 02/14/2020), or if symptoms worsen or fail to improve, for 3 month follow-up knee pain.    [name], [facility] 11/15/2019, 9:23 AM

## 2019-11-15 NOTE — Patient Instructions (Addendum)
Thank you for coming to the office today.  No cholesterol medicine. Goal is < 130, currently at 150s. Try to keep working on improving low cholesterol diet.  Recent Labs    10/28/19 1008  HGBA1C 5.6   Not in range of PreDM. But goal to prevent that.  Wt Readings from Last 3 Encounters:  11/15/19 (!) 368 lb 9.6 oz (167.2 kg)  09/25/19 (!) 376 lb (170.6 kg)  01/30/19 (!) 378 lb (171.5 kg)    For UTI - will send urine for culture. Will also order antibiotic Bactrim this time, twice a day for 7 days, switched plan from last time to see if this works better.  Proceed with BCCP program.  You have symptoms of Vertigo (Benign Paroxysmal Positional Vertigo) - This is commonly caused by inner ear fluid imbalance, sometimes can be worsened by allergies and sinus symptoms, otherwise it can occur randomly sometimes and we may never discover the exact cause. - To treat this, try the Epley Manuever (see diagrams/instructions below) at home up to 3 times a day for 1-2 weeks or until symptoms resolve - You may take OTC meclizine, dramamine as needed up to 3 times a day for dizziness, this will not cure symptoms but may help. Caution may make you drowsy.  If you develop significant worsening episode with vertigo that does not improve and you get severe headache, loss of vision, arm or leg weakness, slurred speech, or other concerning symptoms please seek immediate medical attention at Emergency Department.    See the next page for images describing the Epley Manuever.     ----------------------------------------------------------------------------------------------------------------------        Please schedule a Follow-up Appointment to: Return in about 3 months (around 02/14/2020), or if symptoms worsen or fail to improve, for 3 month follow-up knee pain.  If you have any other questions or concerns, please feel free to call the office or send a message through [place]. You may also  schedule an earlier appointment if necessary.  Additionally, you may be receiving a survey about your experience at our office within a few days to 1 week by e-mail or mail. We value your feedback.  [name], DO [place]

## 2019-11-17 LAB — URINE CULTURE
MICRO NUMBER:: 10374669
SPECIMEN QUALITY:: ADEQUATE

## 2019-11-18 ENCOUNTER — Telehealth: Payer: Self-pay | Admitting: Family Medicine

## 2019-11-18 NOTE — Chronic Care Management (AMB) (Signed)
  Care Management   Note  11/18/2019 Name: Maria Jacobson MRN: [id number] DOB: March 30, 1976  Maria Jacobson is a 44 y.o. year old female who is a primary care patient of [name], DO and is actively engaged with the care management team. I reached out to [name] by phone today to assist with scheduling an initial visit with the Pharmacist  Follow up plan: Unsuccessful telephone outreach attempt made. A HIPPA compliant phone message was left for the patient providing contact information and requesting a return call.  The care management team will reach out to the patient again over the next 7 days.  If patient returns call to provider office, please advise to call [facility]  at [place], [place], [place], [place] [address] Direct Dial: [phone] [email or web address] Website: [email or web address]

## 2019-11-18 NOTE — Chronic Care Management (AMB) (Signed)
  Care Management   Note  11/18/2019 Name: Rosana Farnell MRN: [id number] DOB: 10/27/1975  Latera Mclin is a 44 y.o. year old female who is a primary care patient of [name], DO and is actively engaged with the care management team. I reached out to [name] by phone today to assist with scheduling an initial visit with the Pharmacist  Follow up plan: Telephone appointment with care management team member scheduled for:12/04/2019  Noreene Larsson, [address], [place], [place] [address] Direct Dial: [phone] [email or web address] Website: [email or web address]

## 2019-11-28 ENCOUNTER — Telehealth: Payer: Self-pay

## 2019-12-04 ENCOUNTER — Ambulatory Visit: Payer: Medicaid Other | Admitting: Pharmacist

## 2019-12-04 DIAGNOSIS — F314 Bipolar disorder, current episode depressed, severe, without psychotic features: Secondary | ICD-10-CM

## 2019-12-04 DIAGNOSIS — K219 Gastro-esophageal reflux disease without esophagitis: Secondary | ICD-10-CM

## 2019-12-04 NOTE — Patient Instructions (Addendum)
Thank you allowing the Chronic Care Management Team to be a part of your care! It was a pleasure speaking with you today!     CCM (Chronic Care Management) Team    Noreene Larsson RN, MSN, CCM Nurse Care Coordinator  [phone]   Harlow Asa PharmD  Clinical Pharmacist  [phone]   Eula Fried LCSW Clinical Social Worker [phone]  Visit Information  Goals Addressed            This Visit's Progress   . PharmD - Medication review       CARE PLAN ENTRY (see longitudinal plan of care for additional care plan information)   Current Barriers:  . Chronic Disease Management support, education, and care coordination needs related to HLD, Anxiety, Depression, Bipolar Disorder, and Chronic pain . Financial Barriers  Pharmacist Clinical Goal(s):  Marland Kitchen Over the next 30 days, patient will work with CM Pharmacist to complete medication review and address needs identified  Interventions: . Inter-disciplinary care team collaboration (see longitudinal plan of care) . Comprehensive medication review performed; medication and allergy lists updated in electronic medical record o Counsel regarding risk for sedation/dizziness with alprazolam, clonazepam, cyclobenzaprine and lamotrigine - Denies issues with dizziness/sedation with these medications - Reports occasionally has vertigo lasting a few seconds, which she previously discussed with PCP. Marland Kitchen Reports vertigo improved with exercises from PCP and slower positional changes o Reports 1 week ago self-decreased dose of lamotrigine from lamotrigine 200 mg twice daily to lamotrigine 200 mg QAM and 175 mg QPM due to concern that the medication was impacting her memory, particularly word-finding - Discuss importance of follow up with provider prior to self-adjusting medication.  - Reports lamotrigine managed by psychatrist, Dr. Nelida Gores with [place]. . Reports left message with [facility] requesting a call back, but has not  heard back from office yet. o Reports GERD well controlled since has identified triggers (such as pizza).  - Reports taking omeprazole 20 mg only as needed - Counsel on strategies controlling GERD symptoms . Coordination of care - encourage patient to follow up with dental clinic for recent dental pain. . Denies cost as a barrier to taking medications o Reports receives Abilify Maintena through patient assistance program . Discussed plans with patient for ongoing care management follow up and provided patient with direct contact information for care management team . Place coordination of care call to [place] [phone]) regarding patient's recent lamotrigine self-adjustment and concern for side effects. Leave message requesting call back to patient.  Patient Self Care Activities:  . Attends all scheduled provider appointments . Calls pharmacy for medication refills . Calls provider office for new concerns or questions   Initial goal documentation        Patient verbalizes understanding of instructions provided today.   The care management team will reach out to the patient again over the next 30 days.   Harlow Asa, PharmD, [facility] [phone]    Gastroesophageal Reflux Disease, Adult Gastroesophageal reflux (GER) happens when acid from the stomach flows up into the tube that connects the mouth and the stomach (esophagus). Normally, food travels down the esophagus and stays in the stomach to be digested. However, when a person has GER, food and stomach acid sometimes move back up into the esophagus. If this becomes a more serious problem, the person may be diagnosed with a disease called gastroesophageal reflux disease (GERD). GERD occurs when the reflux:  Happens often.  Causes frequent or severe symptoms.  Causes problems such as damage to the esophagus. When stomach acid  comes in contact with the esophagus, the acid may cause soreness (inflammation) in the esophagus. Over time, GERD may create small holes (ulcers) in the lining of the esophagus. What are the causes? This condition is caused by a problem with the muscle between the esophagus and the stomach (lower esophageal sphincter, or LES). Normally, the LES muscle closes after food passes through the esophagus to the stomach. When the LES is weakened or abnormal, it does not close properly, and that allows food and stomach acid to go back up into the esophagus. The LES can be weakened by certain dietary substances, medicines, and medical conditions, including:  Tobacco use.  Pregnancy.  Having a hiatal hernia.  Alcohol use.  Certain foods and beverages, such as coffee, chocolate, onions, and peppermint. What increases the risk? You are more likely to develop this condition if you:  Have an increased body weight.  Have a connective tissue disorder.  Use NSAID medicines. What are the signs or symptoms? Symptoms of this condition include:  Heartburn.  Difficult or painful swallowing.  The feeling of having a lump in the throat.  Abitter taste in the mouth.  Bad breath.  Having a large amount of saliva.  Having an upset or bloated stomach.  Belching.  Chest pain. Different conditions can cause chest pain. Make sure you see your health care provider if you experience chest pain.  Shortness of breath or wheezing.  Ongoing (chronic) cough or a night-time cough.  Wearing away of tooth enamel.  Weight loss. How is this diagnosed? Your health care provider will take a medical history and perform a physical exam. To determine if you have mild or severe GERD, your health care provider may also monitor how you respond to treatment. You may also have tests, including:  A test to examine your stomach and esophagus with a small camera (endoscopy).  A test thatmeasures the acidity level in  your esophagus.  A test thatmeasures how much pressure is on your esophagus.  A barium swallow or modified barium swallow test to show the shape, size, and functioning of your esophagus. How is this treated? The goal of treatment is to help relieve your symptoms and to prevent complications. Treatment for this condition may vary depending on how severe your symptoms are. Your health care provider may recommend:  Changes to your diet.  Medicine.  Surgery. Follow these instructions at home: Eating and drinking   Follow a diet as recommended by your health care provider. This may involve avoiding foods and drinks such as: ? Coffee and tea (with or without caffeine). ? Drinks that containalcohol. ? Energy drinks and sports drinks. ? Carbonated drinks or sodas. ? Chocolate and cocoa. ? Peppermint and mint flavorings. ? Garlic and onions. ? Horseradish. ? Spicy and acidic foods, including peppers, chili powder, curry powder, vinegar, hot sauces, and barbecue sauce. ? Citrus fruit juices and citrus fruits, such as oranges, lemons, and limes. ? Tomato-based foods, such as red sauce, chili, salsa, and pizza with red sauce. ? Fried and fatty foods, such as donuts, french fries, potato chips, and high-fat dressings. ? High-fat meats, such as hot dogs and fatty cuts of red and white meats, such as rib eye steak, sausage, ham, and bacon. ? High-fat dairy items, such as whole milk, butter, and cream cheese.  Eat small, frequent meals instead of large meals.  Avoid drinking large  amounts of liquid with your meals.  Avoid eating meals during the 2-3 hours before bedtime.  Avoid lying down right after you eat.  Do not exercise right after you eat. Lifestyle   Do not use any products that contain nicotine or tobacco, such as cigarettes, e-cigarettes, and chewing tobacco. If you need help quitting, ask your health care provider.  Try to reduce your stress by using methods such as yoga  or meditation. If you need help reducing stress, ask your health care provider.  If you are overweight, reduce your weight to an amount that is healthy for you. Ask your health care provider for guidance about a safe weight loss goal. General instructions  Pay attention to any changes in your symptoms.  Take over-the-counter and prescription medicines only as told by your health care provider. Do not take aspirin, ibuprofen, or other NSAIDs unless your health care provider told you to do so.  Wear loose-fitting clothing. Do not wear anything tight around your waist that causes pressure on your abdomen.  Raise (elevate) the head of your bed about 6 inches (15 cm).  Avoid bending over if this makes your symptoms worse.  Keep all follow-up visits as told by your health care provider. This is important. Contact a health care provider if:  You have: ? New symptoms. ? Unexplained weight loss. ? Difficulty swallowing or it hurts to swallow. ? Wheezing or a persistent cough. ? A hoarse voice.  Your symptoms do not improve with treatment. Get help right away if you:  Have pain in your arms, neck, jaw, teeth, or back.  Feel sweaty, dizzy, or light-headed.  Have chest pain or shortness of breath.  Vomit and your vomit looks like blood or coffee grounds.  Faint.  Have stool that is bloody or black.  Cannot swallow, drink, or eat. Summary  Gastroesophageal reflux happens when acid from the stomach flows up into the esophagus. GERD is a disease in which the reflux happens often, causes frequent or severe symptoms, or causes problems such as damage to the esophagus.  Treatment for this condition may vary depending on how severe your symptoms are. Your health care provider may recommend diet and lifestyle changes, medicine, or surgery.  Contact a health care provider if you have new or worsening symptoms.  Take over-the-counter and prescription medicines only as told by your health  care provider. Do not take aspirin, ibuprofen, or other NSAIDs unless your health care provider told you to do so.  Keep all follow-up visits as told by your health care provider. This is important. This information is not intended to replace advice given to you by your health care provider. Make sure you discuss any questions you have with your health care provider. Document Revised: 01/24/2018 Document Reviewed: 01/24/2018 Elsevier Patient Education  [place].

## 2019-12-04 NOTE — Chronic Care Management (AMB) (Signed)
Care Management   Initial Visit Note  12/04/2019 Name: Antonya Leeder MRN: [id number] DOB: 03-02-1976  Subjective:    Evamae Rowen is a 44 y.o. year old female who sees [name], DO for primary care. The care management team was consulted for assistance with care management and care coordination needs related to medication review.   Outreach to [name] by phone today.  Review of patient status, including review of consultants reports, relevant laboratory and other test results, and collaboration with appropriate care team members and the patient's provider was performed as part of comprehensive patient evaluation and provision of care management services.    SDOH (Social Determinants of Health) assessments performed: No See Care Plan activities for detailed interventions related to SDOH)     Objective:  Lab Results  Component Value Date   CREATININE 0.95 10/28/2019   BUN 13 10/28/2019   NA 137 10/28/2019   K 4.4 10/28/2019   CL 99 10/28/2019   CO2 28 10/28/2019    BP Readings from Last 3 Encounters:  11/15/19 124/86  09/25/19 132/73  01/30/19 138/85    Outpatient Encounter Medications as of 12/04/2019  Medication Sig Note  . ABILIFY MAINTENA 400 MG PRSY INTRAMUSCULAR EVERY 4 WEEKS   . acetaminophen (TYLENOL) 500 MG tablet Take 1,000 mg by mouth every 4 (four) hours as needed for headache.   . albuterol (VENTOLIN HFA) 108 (90 Base) MCG/ACT inhaler INHALE 2 PUFFS INTO LUNGS EVERY 4 HOURS AS NEEDED FOR WHEEZING OR SHORTNESS OF BREATH (COUGH).   Marland Kitchen ALPRAZolam (XANAX) 1 MG tablet Take 1 mg by mouth at bedtime as needed for anxiety.   Marland Kitchen amphetamine-dextroamphetamine (ADDERALL) 30 MG tablet Take 30 mg by mouth daily. 12/04/2019: QAM  . clonazePAM (KLONOPIN) 0.5 MG tablet Take 0.5 mg by mouth 5 (five) times daily.   . cyclobenzaprine (FLEXERIL) 10 MG tablet Take 1 tablet (10 mg total) by mouth 3 (three) times daily as needed for muscle spasms.   . fexofenadine  (ALLEGRA) 180 MG tablet Take 180 mg by mouth daily as needed for allergies or rhinitis.   . furosemide (LASIX) 20 MG tablet TAKE 1 TAB BY MOUTH EVERY DAY FOR SWELLING, MAY INCREASE TO 2 TABS IF NEED FOR MAX 7 DAYS PER FLARE   . lamoTRIgine (LAMICTAL) 100 MG tablet Take 200 mg by mouth 2 (two) times daily. Taking 200 mg each morning and 175 mg each evening   . melatonin 3 MG TABS tablet Take 3 mg by mouth at bedtime.   . meloxicam (MOBIC) 15 MG tablet Take 1 tablet (15 mg total) by mouth daily.   Marland Kitchen omeprazole (PRILOSEC) 20 MG capsule Take 1 capsule (20 mg total) by mouth daily as needed.   . Triamcinolone Acetonide (NASACORT ALLERGY 24HR NA) Place into the nose daily as needed.   . [DISCONTINUED] fluconazole (DIFLUCAN) 150 MG tablet Take one tablet by mouth on Day 1. Repeat dose 2nd tablet on Day 3. (Patient not taking: Reported on 12/04/2019)    No facility-administered encounter medications on file as of 12/04/2019.    Assessment:  Goals Addressed            This Visit's Progress   . PharmD - Medication review       CARE PLAN ENTRY (see longitudinal plan of care for additional care plan information)   Current Barriers:  . Chronic Disease Management support, education, and care coordination needs related to HLD, Anxiety, Depression, Bipolar Disorder, and Chronic pain . Financial Barriers  Pharmacist Clinical Goal(s):  Marland Kitchen Over the next 30 days, patient will work with CM Pharmacist to complete medication review and address needs identified  Interventions: . Inter-disciplinary care team collaboration (see longitudinal plan of care) . Comprehensive medication review performed; medication and allergy lists updated in electronic medical record o Counsel regarding risk for sedation/dizziness with alprazolam, clonazepam, cyclobenzaprine and lamotrigine - Denies issues with dizziness/sedation with these medications - Reports occasionally has vertigo lasting a few seconds, which she previously  discussed with PCP. Marland Kitchen Reports vertigo improved with exercises from PCP and slower positional changes o Reports 1 week ago self-decreased dose of lamotrigine from lamotrigine 200 mg twice daily to lamotrigine 200 mg QAM and 175 mg QPM due to concern that the medication was impacting her memory, particularly word-finding - Discuss importance of follow up with provider prior to self-adjusting medication.  - Reports lamotrigine managed by psychatrist, Dr. Nelida Gores with [place]. . Reports left message with [facility] requesting a call back, but has not heard back from office yet. o Reports GERD well controlled since has identified triggers (such as pizza).  - Reports taking omeprazole 20 mg only as needed - Counsel on strategies controlling GERD symptoms . Coordination of care - encourage patient to follow up with dental clinic for recent dental pain. . Denies cost as a barrier to taking medications o Reports receives Abilify Maintena through patient assistance program . Discussed plans with patient for ongoing care management follow up and provided patient with direct contact information for care management team . Place coordination of care call to [place] [phone]) regarding patient's recent lamotrigine self-adjustment and concern for side effects. Leave message requesting call back to patient.  Patient Self Care Activities:  . Attends all scheduled provider appointments . Calls pharmacy for medication refills . Calls provider office for new concerns or questions   Initial goal documentation         Follow up plan:  The care management team will reach out to the patient again over the next 30 days.    Harlow Asa, PharmD, [place] [facility] [phone]

## 2019-12-06 ENCOUNTER — Ambulatory Visit: Payer: Self-pay | Admitting: Pharmacist

## 2019-12-06 DIAGNOSIS — F314 Bipolar disorder, current episode depressed, severe, without psychotic features: Secondary | ICD-10-CM

## 2019-12-06 NOTE — Chronic Care Management (AMB) (Signed)
  Chronic Care Management   Note  12/06/2019 Name: Maria Jacobson MRN: [id number] DOB: [birth year]  UPDATE: Receive call back from Dr. Nelida Gores with [place] . Provider confirms plan for reducing dose of lamotrigine down by 50 mg every two weeks to a dose of lamotrigine 100 mg twice daily as reported by patient, patient to follow up with her directly for side effects or concerns. Collaborate with provider regarding medications with potential to impact memory, including benzodiazepines.    Follow up plan: The care management team will reach out to the patient again over the next 30 days.   Harlow Asa, PharmD, [place] Management [phone]

## 2019-12-06 NOTE — Patient Instructions (Signed)
Thank you allowing the Care Management Team to be a part of your care! It was a pleasure speaking with you today!     Care Management Team    Noreene Larsson RN, MSN, CCM Nurse Care Coordinator  [phone]   Harlow Asa PharmD  Clinical Pharmacist  [phone]   Eula Fried LCSW Clinical Social Worker [phone]   Visit Information  Goals Addressed            This Visit's Progress   . PharmD - Medication review       CARE PLAN ENTRY (see longitudinal plan of care for additional care plan information)   Current Barriers:  . Chronic Disease Management support, education, and care coordination needs related to HLD, Anxiety, Depression, Bipolar Disorder, and Chronic pain . Financial Barriers  Pharmacist Clinical Goal(s):  Marland Kitchen Over the next 30 days, patient will work with CM Pharmacist to complete medication review and address needs identified  Interventions: . Inter-disciplinary care team collaboration (see longitudinal plan of care) . Receive message back from Dr. Nelida Gores with [place] [phone]) regarding patient's recent lamotrigine self-adjustment and side effects. . Place coordination of care call back to Dr. Nelida Gores [phone]) as requested. Leave a message requesting a call back. . Follow up with patient regarding lamotrigine dose and side effects o Ms. Dunkley reports she received a call back from Dr. Nelida Gores with [facility] yesterday and discussed concerns for memory side effects with current lamotrigine dose  Patient Self Care Activities:  . Attends all scheduled provider appointments . Calls pharmacy for medication refills . Calls provider office for new concerns or questions   Please see past updates related to this goal by clicking on the "Past Updates" button in the selected goal         Patient verbalizes understanding of instructions provided today.   The care management team will reach out to the patient again over  the next 30 days.   Harlow Asa, PharmD, [place] [employer] [phone]

## 2019-12-06 NOTE — Chronic Care Management (AMB) (Signed)
  Care Management   Follow Up Note   12/06/2019 Name: Maria Jacobson MRN: [id number] DOB: 1975-11-20  Referred by: Maria Hauser, DO Reason for referral : Chronic Care Management (Patient Phone Call) and [name] is a 44 y.o. year old female who is a primary care patient of Maria Hauser, DO. The care management team was consulted for assistance with care management and care coordination needs.    Receive message back from Dr. Nelida Jacobson with Maria Jacobson and return call. Leave message for provider  Outreach to Maria Jacobson by phone today.  Review of patient status, including review of consultants reports, relevant laboratory and other test results, and collaboration with appropriate care team members and the patient's provider was performed as part of comprehensive patient evaluation and provision of chronic care management services.    SDOH (Social Determinants of Health) assessments performed: No See Care Plan activities for detailed interventions related to Maria Jacobson)     Advanced Directives: See Care Plan and Vyn[employer] application for related entries.   Goals Addressed            This Visit's Progress   . PharmD - Medication review       CARE PLAN ENTRY (see longitudinal plan of care for additional care plan information)   Current Barriers:  . Chronic Disease Management support, education, and care coordination needs related to HLD, Anxiety, Depression, Bipolar Disorder, and Chronic pain . Financial Barriers  Pharmacist Clinical Goal(s):  Marland Kitchen Over the next 30 days, patient will work with CM Pharmacist to complete medication review and address needs identified  Interventions: . Inter-disciplinary care team collaboration (see longitudinal plan of care) . Receive message back from Dr. Nelida Jacobson with [facility] [phone]) regarding patient's recent lamotrigine self-adjustment and side effects. . Place  coordination of care call back to Dr. Nelida Jacobson [phone]) as requested. Leave a message requesting a call back. . Follow up with patient regarding lamotrigine dose and side effects o Maria Jacobson reports she received a call back from Dr. Nelida Jacobson with [facility] yesterday and discussed concerns for memory side effects with current lamotrigine dose o Reports Dr. Nelida Jacobson advised her to taper lamotrigine dose down by 50 mg every two weeks to a dose of lamotrigine 100 mg twice daily - Reports is accordingly currently taking lamotrigine 100 mg - 2 tablets (200 mg QAM and 1.5 tablets (150 mg) QPM - Reports will continue to follow up with Dr. Nelida Jacobson regarding adjustment and side effects  Patient Self Care Activities:  . Attends all scheduled provider appointments . Calls pharmacy for medication refills . Calls provider office for new concerns or questions   Please see past updates related to this goal by clicking on the "Past Updates" button in the selected goal          Plan  The care management team will reach out to the patient again over the next 30 days.    Harlow Asa, PharmD, [place] [employer] [phone]

## 2019-12-17 ENCOUNTER — Ambulatory Visit: Payer: Self-pay

## 2019-12-18 ENCOUNTER — Telehealth: Payer: Self-pay | Admitting: Family Medicine

## 2019-12-18 NOTE — Telephone Encounter (Signed)
   [facility] 12/18/2019   Name: Maria Jacobson   MRN: [id number]   DOB: 10/01/1975   AGE: 44 y.o.   GENDER: female   PCP [name], DO.   Called pt regarding Community Resource Referral for assistance with disability, dental, and pain. Left message for patient to give Care Guide a call regarding referral.   [place], Care Management Phone: [phone] Email: [email or web address]

## 2019-12-19 ENCOUNTER — Ambulatory Visit: Payer: Self-pay

## 2019-12-19 NOTE — Chronic Care Management (AMB) (Signed)
  Care Management   Follow Up Note   12/19/2019 Name: Maria Jacobson MRN: [id number] DOB: [birth year]  Referred by: [name], DO Reason for referral : Care Coordination   Maria Jacobson is a 44 y.o. year old female who is a primary care patient of [name], DO. The care management team was consulted for assistance with care management and care coordination needs.    Review of patient status, including review of consultants reports, relevant laboratory and other test results, and collaboration with appropriate care team members and the patient's provider was performed as part of comprehensive patient evaluation and provision of chronic care management services.    LCSW completed CCM outreach attempt today but was unable to reach patient successfully. A HIPPA compliant voice message was left encouraging patient to return call once available. LCSW rescheduled CCM SW appointment as well.  A HIPPA compliant phone message was left for the patient providing contact information and requesting a return call.   Eula Fried, BSW, MSW, [email or web address] Phone: [phone]

## 2019-12-23 ENCOUNTER — Ambulatory Visit: Payer: Self-pay | Admitting: General Practice

## 2019-12-23 ENCOUNTER — Telehealth: Payer: Medicaid Other | Admitting: General Practice

## 2019-12-23 DIAGNOSIS — F419 Anxiety disorder, unspecified: Secondary | ICD-10-CM

## 2019-12-23 DIAGNOSIS — E782 Mixed hyperlipidemia: Secondary | ICD-10-CM

## 2019-12-23 DIAGNOSIS — F314 Bipolar disorder, current episode depressed, severe, without psychotic features: Secondary | ICD-10-CM

## 2019-12-23 DIAGNOSIS — M5441 Lumbago with sciatica, right side: Secondary | ICD-10-CM

## 2019-12-23 NOTE — Chronic Care Management (AMB) (Signed)
Care Management   Follow Up Note   12/23/2019 Name: Maria Jacobson MRN: [id number] DOB: 21-Aug-1975  Referred by: [name], DO Reason for referral : Care Coordination (Follow up: Chronic pain/HLD/Bipolar/Anxiety)   Maria Jacobson is a 44 y.o. year old female who is a primary care patient of [name], DO. The care management team was consulted for assistance with care management and care coordination needs.    Review of patient status, including review of consultants reports, relevant laboratory and other test results, and collaboration with appropriate care team members and the patient's provider was performed as part of comprehensive patient evaluation and provision of chronic care management services.    SDOH (Social Determinants of Health) assessments performed: Yes- review of paperwork for disability and charity care  See Care Plan activities for detailed interventions related to [facility])     Advanced Directives: See Care Plan and [employer] application for related entries.  BP Readings from Last 3 Encounters:  11/15/19 124/86  09/25/19 132/73  01/30/19 138/85    Goals Addressed            This Visit's Progress   . RNCM: pt-"Most days I have to stay in bed due to the chronic pain I have" (pt-stated)       CARE PLAN ENTRY (see longtitudinal plan of care for additional care plan information)  Current Barriers:  . Chronic Disease Management support, education, and care coordination needs related to HLD, Anxiety, Depression, Bipolar Disorder, and Chronic pain . Financial constraints . ADL/IADL limitations  Clinical Goal(s) related to HLD, Anxiety, Depression, Bipolar Disorder, and chronic pain :  Over the next 120 days, patient will:  . Work with the care management team to address educational, disease management, and care coordination needs  . Begin or continue self health monitoring activities as directed today  adhere to a heart healthy diet and  utilize coping mechanisms for depression, anxiety, and bipolar episodes  . Call provider office for new or worsened signs and symptoms Shortness of breath and New or worsened symptom related to swelling in legs, anxiety, depression and biploar  . Call care management team with questions or concerns . Verbalize basic understanding of patient centered plan of care established today  Interventions related to HLD, Anxiety, Depression, Bipolar Disorder, and Chronic pain  :  . Evaluation of current treatment plans and patient's adherence to plan as established by provider.  The patient is wanting to make positive changes in her life that will help her improve her health and well being. The patient is working with the [facility] and CCM team to meet her health and well being goals. Making positive changes.  Since last call the patient has lost about 14 pounds by altering dietary intake.  . Assessed patient understanding of disease states.  The patient has a good understanding of her chronic conditions. She sees pcp and psychiatrist.  Willing to work with the CCM team to help with the chronic conditions she has. . Assessed patient's education and care coordination needs.  The patient is unable to work due to her health. She needs help with filing for disability and charity care so she can be seen by orthopedic provider.  The patient also needs dental work. Care guide referral for assistance in applying for disability, charity care and dental resources in [place]. The patient has an appointment on 01-02-2020 to get her ID card updated. This should be the last document she needs to complete the charity care application.  The patient is calling the CCM team care guides back today to help with disability paperwork and other needs. The patient wants to do what she can to improve her health and well being. Praised for accomplishments.  . Provided disease specific education to patient.  Education on the benefits of Heart  healthy diet.  The patient is ready to make dietary changes to help with weight reduction, decrease swelling in her legs, and manage her HLD more effectively.  Will send education material through the My chart system and EMMI for heart healthy diet. The patient has cut out red meats and has lost 14 pounds since the last outreach with the [facility]. Praised for accomplishments.  . Assessed mammogram appointment. The patient had a "BSAP" appointment last week but had to cancel due to a family friend passing away.  The patient has a new appointment scheduled for June 1st.  The patient is wanting to get back on track with her health and is doing great things toward improving her health and well being.  . Assessed the patients pain level today. She rates her pain at "miserable"  The patient says the pain is unmanageable at times and she has to stay in bed all  day. She is very limited in her mobility.  The patient is open to ideas and suggestions to help with pain control.  Working with the CCM team care guides to get approved for charity care and disability.  . Assessed the patient's  new complaint of headaches since last Friday.  The patient states they have been "really bad".  The patient sees pcp tomorrow and will discuss with pcp this new concern. Completed . Collaborated with appropriate clinical care team members regarding patient needs . Referral placed for [facility]ist[facility] and any recommendations for improved help and well being/pain control . Referral for LCSW: The patient has a long standing history of anxiety, depression, and bipolar disorder. The patient also needs assistance and guidance on charity care. The patient has a psychiatrist but welcomes any support and help in dealing with her conditions. She has a significant other, "Maria Jacobson", that is very active in her care and a great support to the patient.  . Assessed and reviewed medications with the patient . The patient is compliant with current  medication regimen. Updated list today.  . Encouraged the patient to reach out to the care guide team to get help with paperwork.  The patient is going to call today.  Also the [facility] will follow up on referral for LCSW.  The patient is thankful for the CCM support and help to get what she needs to take better care of herself. The patient states she is motivated to do what she can to help herself and improve her health and well being.   Patient Self Care Activities related to HLD, Anxiety, Depression, Bipolar Disorder, and Chronic pain :  . Patient is unable to independently self-manage chronic health conditions  Please see past updates related to this goal by clicking on the "Past Updates" button in the selected goal          The care management team will reach out to the patient again over the next 60 days.   Noreene Larsson RN, MSN, [place] Mobile: [phone]

## 2019-12-23 NOTE — Patient Instructions (Addendum)
Visit Information  Goals Addressed            This Visit's Progress   . RNCM: pt-"Most days I have to stay in bed due to the chronic pain I have" (pt-stated)       CARE PLAN ENTRY (see longtitudinal plan of care for additional care plan information)  Current Barriers:  . Chronic Disease Management support, education, and care coordination needs related to HLD, Anxiety, Depression, Bipolar Disorder, and Chronic pain . Financial constraints . ADL/IADL limitations  Clinical Goal(s) related to HLD, Anxiety, Depression, Bipolar Disorder, and chronic pain :  Over the next 120 days, patient will:  . Work with the care management team to address educational, disease management, and care coordination needs  . Begin or continue self health monitoring activities as directed today  adhere to a heart healthy diet and utilize coping mechanisms for depression, anxiety, and bipolar episodes  . Call provider office for new or worsened signs and symptoms Shortness of breath and New or worsened symptom related to swelling in legs, anxiety, depression and biploar  . Call care management team with questions or concerns . Verbalize basic understanding of patient centered plan of care established today  Interventions related to HLD, Anxiety, Depression, Bipolar Disorder, and Chronic pain  :  . Evaluation of current treatment plans and patient's adherence to plan as established by provider.  The patient is wanting to make positive changes in her life that will help her improve her health and well being. The patient is working with the [facility] and CCM team to meet her health and well being goals. Making positive changes.  Since last call the patient has lost about 14 pounds by altering dietary intake.  . Assessed patient understanding of disease states.  The patient has a good understanding of her chronic conditions. She sees pcp and psychiatrist.  Willing to work with the CCM team to help with the chronic conditions  she has. . Assessed patient's education and care coordination needs.  The patient is unable to work due to her health. She needs help with filing for disability and charity care so she can be seen by orthopedic provider.  The patient also needs dental work. Care guide referral for assistance in applying for disability, charity care and dental resources in [place]. The patient has an appointment on 01-02-2020 to get her ID card updated. This should be the last document she needs to complete the charity care application.  The patient is calling the CCM team care guides back today to help with disability paperwork and other needs. The patient wants to do what she can to improve her health and well being. Praised for accomplishments.  . Provided disease specific education to patient.  Education on the benefits of Heart healthy diet.  The patient is ready to make dietary changes to help with weight reduction, decrease swelling in her legs, and manage her HLD more effectively.  Will send education material through the My chart system and EMMI for heart healthy diet. The patient has cut out red meats and has lost 14 pounds since the last outreach with the [facility]. Praised for accomplishments.  . Assessed mammogram appointment. The patient had a "BSAP" appointment last week but had to cancel due to a family friend passing away.  The patient has a new appointment scheduled for June 1st.  The patient is wanting to get back on track with her health and is doing great things toward improving her health and  well being.  . Assessed the patients pain level today. She rates her pain at "miserable"  The patient says the pain is unmanageable at times and she has to stay in bed all  day. She is very limited in her mobility.  The patient is open to ideas and suggestions to help with pain control.  Working with the CCM team care guides to get approved for charity care and disability.  . Assessed the patient's  new complaint of  headaches since last Friday.  The patient states they have been "really bad".  The patient sees pcp tomorrow and will discuss with pcp this new concern. Completed . Collaborated with appropriate clinical care team members regarding patient needs . Referral placed for pharmacist: [facility] and any recommendations for improved help and well being/pain control . Referral for LCSW: The patient has a long standing history of anxiety, depression, and bipolar disorder. The patient also needs assistance and guidance on charity care. The patient has a psychiatrist but welcomes any support and help in dealing with her conditions. She has a significant other, "[name]", that is very active in her care and a great support to the patient.  . Assessed and reviewed medications with the patient . The patient is compliant with current medication regimen. Updated list today.  . Encouraged the patient to reach out to the care guide team to get help with paperwork.  The patient is going to call today.  Also the [facility] will follow up on referral for LCSW.  The patient is thankful for the CCM support and help to get what she needs to take better care of herself. The patient states she is motivated to do what she can to help herself and improve her health and well being.   Patient Self Care Activities related to HLD, Anxiety, Depression, Bipolar Disorder, and Chronic pain :  . Patient is unable to independently self-manage chronic health conditions  Please see past updates related to this goal by clicking on the "Past Updates" button in the selected goal         Patient verbalizes understanding of instructions provided today.   The care management team will reach out to the patient again over the next 60 days.   Noreene Larsson RN, MSN, [facility] Mobile: [phone]   Managing Anxiety, Adult After being diagnosed with an anxiety  disorder, you may be relieved to know why you have felt or behaved a certain way. You may also feel overwhelmed about the treatment ahead and what it will mean for your life. With care and support, you can manage this condition and recover from it. How to manage lifestyle changes Managing stress and anxiety  Stress is your body's reaction to life changes and events, both good and bad. Most stress will last just a few hours, but stress can be ongoing and can lead to more than just stress. Although stress can play a major role in anxiety, it is not the same as anxiety. Stress is usually caused by something external, such as a deadline, test, or competition. Stress normally passes after the triggering event has ended.  Anxiety is caused by something internal, such as imagining a terrible outcome or worrying that something will go wrong that will devastate you. Anxiety often does not go away even after the triggering event is over, and it can become long-term (chronic) worry. It is important to understand the differences between  stress and anxiety and to manage your stress effectively so that it does not lead to an anxious response. Talk with your health care provider or a counselor to learn more about reducing anxiety and stress. He or she may suggest tension reduction techniques, such as: Music therapy. This can include creating or listening to music that you enjoy and that inspires you. Mindfulness-based meditation. This involves being aware of your normal breaths while not trying to control your breathing. It can be done while sitting or walking. Centering prayer. This involves focusing on a word, phrase, or sacred image that means something to you and brings you peace. Deep breathing. To do this, expand your stomach and inhale slowly through your nose. Hold your breath for 3-5 seconds. Then exhale slowly, letting your stomach muscles relax. Self-talk. This involves identifying thought patterns that lead  to anxiety reactions and changing those patterns. Muscle relaxation. This involves tensing muscles and then relaxing them. Choose a tension reduction technique that suits your lifestyle and personality. These techniques take time and practice. Set aside 5-15 minutes a day to do them. Therapists can offer counseling and training in these techniques. The training to help with anxiety may be covered by some insurance plans. Other things you can do to manage stress and anxiety include: Keeping a stress/anxiety diary. This can help you learn what triggers your reaction and then learn ways to manage your response. Thinking about how you react to certain situations. You may not be able to control everything, but you can control your response. Making time for activities that help you relax and not feeling guilty about spending your time in this way. Visual imagery and yoga can help you stay calm and relax.  Medicines Medicines can help ease symptoms. Medicines for anxiety include: Anti-anxiety drugs. Antidepressants. Medicines are often used as a primary treatment for anxiety disorder. Medicines will be prescribed by a health care provider. When used together, medicines, psychotherapy, and tension reduction techniques may be the most effective treatment. Relationships Relationships can play a big part in helping you recover. Try to spend more time connecting with trusted friends and family members. Consider going to couples counseling, taking family education classes, or going to family therapy. Therapy can help you and others better understand your condition. How to recognize changes in your anxiety Everyone responds differently to treatment for anxiety. Recovery from anxiety happens when symptoms decrease and stop interfering with your daily activities at home or work. This may mean that you will start to: Have better concentration and focus. Worry will interfere less in your daily thinking. Sleep  better. Be less irritable. Have more energy. Have improved memory. It is important to recognize when your condition is getting worse. Contact your health care provider if your symptoms interfere with home or work and you feel like your condition is not improving. Follow these instructions at home: Activity Exercise. Most adults should do the following: Exercise for at least 150 minutes each week. The exercise should increase your heart rate and make you sweat (moderate-intensity exercise). Strengthening exercises at least twice a week. Get the right amount and quality of sleep. Most adults need 7-9 hours of sleep each night. Lifestyle  Eat a healthy diet that includes plenty of vegetables, fruits, whole grains, low-fat dairy products, and lean protein. Do not eat a lot of foods that are high in solid fats, added sugars, or salt. Make choices that simplify your life. Do not use any products that contain nicotine or tobacco,  such as cigarettes, e-cigarettes, and chewing tobacco. If you need help quitting, ask your health care provider. Avoid caffeine, alcohol, and certain over-the-counter cold medicines. These may make you feel worse. Ask your pharmacist which medicines to avoid. General instructions Take over-the-counter and prescription medicines only as told by your health care provider. Keep all follow-up visits as told by your health care provider. This is important. Where to find support You can get help and support from these sources: Self-help groups. Online and [employer]. A trusted spiritual leader. Couples counseling. Family education classes. Family therapy. Where to find more information You may find that joining a support group helps you deal with your anxiety. The following sources can help you locate counselors or support groups near you: [place]: www.mentalhealthamerica.net Anxiety and Depression Association of [place] (ADAA):  https://www.clark.net/ National Alliance on Mental Illness (NAMI): www.nami.org Contact a health care provider if you: Have a hard time staying focused or finishing daily tasks. Spend many hours a day feeling worried about everyday life. Become exhausted by worry. Start to have headaches, feel tense, or have nausea. Urinate more than normal. Have diarrhea. Get help right away if you have: A racing heart and shortness of breath. Thoughts of hurting yourself or others. If you ever feel like you may hurt yourself or others, or have thoughts about taking your own life, get help right away. You can go to your nearest emergency department or call: Your local emergency services (911 in the [place] A suicide crisis helpline, such as the [place] at 732-432-6876. This is open 24 hours a day. Summary Taking steps to learn and use tension reduction techniques can help calm you and help prevent triggering an anxiety reaction. When used together, medicines, psychotherapy, and tension reduction techniques may be the most effective treatment. Family, friends, and partners can play a big part in helping you recover from an anxiety disorder. This information is not intended to replace advice given to you by your health care provider. Make sure you discuss any questions you have with your health care provider. Document Revised: 12/18/2018 Document Reviewed: 12/18/2018 Elsevier Patient Education  [place].

## 2019-12-25 ENCOUNTER — Ambulatory Visit: Payer: Self-pay | Admitting: Pharmacist

## 2019-12-25 ENCOUNTER — Telehealth: Payer: Self-pay

## 2019-12-25 NOTE — Chronic Care Management (AMB) (Signed)
  Care Management   Follow Up Note   12/25/2019 Name: Maria Jacobson MRN: [id number] DOB: May 03, 1976  Referred by: [name], DO Reason for referral : Chronic Care Management (Patient Phone Call)   Maria Jacobson is a 44 y.o. year old female who is a primary care patient of [name], DO. The care management team was consulted for assistance with care management and care coordination needs.    Was unable to reach patient via telephone today and have left HIPAA compliant voicemail asking patient to return my call.   Plan  The care management team will reach out to the patient again over the next 30 days.   Harlow Asa, PharmD, [place] [employer] [phone]

## 2019-12-27 NOTE — Telephone Encounter (Signed)
° °  SF 12/27/2019    Name: Maria Jacobson    MRN: [id number]    DOB: 1976-01-06    AGE: 44 y.o.    GENDER: female    PCP [name], DO.   Called pt regarding Community Resource Referral for disability, dental, charity care. Patient stated that she is still in need of resources. Informed patient that for disability she will need to contact the [place] to startt the application process. Gave Maria Jacobson the phone number for [employer] [phone]). Patient stated she will give [employer] a call. Patient also stated that she is in need of dental work. She stated that she is having pain on the left side of her mouth and that some teeth may need to be pulled. Patient states no income at this time. Informed patient that Care Guide will look for dental resources that might be able to assist her. Maria Jacobson is also looking for resources for charity care. She states that she is having issues with her knee and that she has been trying to get referred to an orthopedic doctor, but has been unable to do so because some providers will not take self pay. Informed patient that Care Guide will look into charity care to see if there is program that might be able to assist. Patient also stated that she will be going to [place] to apply for Food Stamp ([employer]) around the 2nd week in June. Informed patient that Care Guide try to find resources that may benefit her and give her a call back next week. Patient stated understanding.    [place], Care Management Phone: [phone] Email: [email or web address]

## 2019-12-31 ENCOUNTER — Ambulatory Visit
Admission: RE | Admit: 2019-12-31 | Discharge: 2019-12-31 | Disposition: A | Discharge status: Home / Self Care | Payer: Self-pay | Source: Ambulatory Visit | Attending: Oncology | Admitting: Oncology

## 2019-12-31 ENCOUNTER — Ambulatory Visit: Payer: Self-pay | Attending: Oncology

## 2019-12-31 ENCOUNTER — Telehealth: Payer: Self-pay | Admitting: *Deleted

## 2019-12-31 ENCOUNTER — Other Ambulatory Visit: Payer: Self-pay

## 2019-12-31 VITALS — BP 131/88 | HR 97 | Temp 98.1°F | Ht 69.0 in | Wt 364.0 lb

## 2019-12-31 DIAGNOSIS — Z0000 Encounter for general adult medical examination without abnormal findings: Secondary | ICD-10-CM | POA: Insufficient documentation

## 2019-12-31 NOTE — Progress Notes (Signed)
  Subjective:     Patient ID: Maria Jacobson, female   DOB: 11/29/1975, 44 y.o.   MRN: [id number]  HPI   Review of Systems     Objective:   Physical Exam Chest:     Breasts:        Right: No swelling, bleeding, inverted nipple, mass, nipple discharge, skin change or tenderness.        Left: No swelling, bleeding, inverted nipple, mass, nipple discharge, skin change or tenderness.     Comments: Large pendulous breasts       Assessment:     44 year old patient presents for [place] clinic visit.  Patient screened, and meets B[facility]CP eligibility.  Patient does not have insurance, Medicare or Medicaid. Instructed patient on breast self awareness using teach back method. Clinical breast exam unremarkable.  No mass or lump palpated.    Plan:     Sent for bilateral screening mammogram.

## 2020-01-01 NOTE — Progress Notes (Signed)
Letter mailed from [facility] to notify of normal mammogram results.  Patient to return in one year for annual screening.  Copy to [facility].

## 2020-01-03 ENCOUNTER — Telehealth: Payer: Self-pay | Admitting: Family Medicine

## 2020-01-03 NOTE — Telephone Encounter (Signed)
   SF 01/03/2020   Name: Maria Jacobson   MRN: [id number]   DOB: 1976/01/25   AGE: 44 y.o.   GENDER: female   PCP [name], DO.   Called pt regarding Community Resource Referral for assistance with disability, dental, and orthopedics. Left message for patient to give Care Guide a call back.    [place], Care Management Phone: [phone] Email: [email or web address]

## 2020-01-06 ENCOUNTER — Ambulatory Visit: Payer: Self-pay | Admitting: Pharmacist

## 2020-01-06 NOTE — Chronic Care Management (AMB) (Signed)
  Care Management   Follow Up Note   01/06/2020 Name: Meggin Ola MRN: [id number] DOB: 04/22/1976  Referred by: [name], DO Reason for referral : Chronic Care Management (Patient Phone Call)   Bronnie Vasseur is a 44 y.o. year old female who is a primary care patient of [name], DO. The care management team was consulted for assistance with care management and care coordination needs.    Receive a voicemail from [name] requesting a call back.  Was unable to reach patient via telephone today and have left HIPAA compliant voicemail asking patient to return my call.   Plan  The care management team will reach out to the patient again over the next 30 days.   Harlow Asa, PharmD, [place] [employer] [phone]

## 2020-01-09 NOTE — Telephone Encounter (Signed)
   SF 01/09/2020   Name: Maria Jacobson   MRN: [id number]   DOB: [birth year]   AGE: 44 y.o.   GENDER: female   PCP [name], DO.   Called pt regarding Community Resource Referral for disability, orthopedic, and dental. Patient stated that she has finally been able to get her ID through the [facility]. She stated she is just waiting for the it to come in the mail and then she will call [employer] to apply for disability and also contact DSS to apply for FNS. Gave patient dental and orthorpedic resources:  [facility] Same day Dentures  Teeth Extractions $75 per tooth [phone] for arch and $545 for set and Prem $395 arch /$695 set [address], [place] [address] between [place] and [place] ([facility]  [phone] Accepts both self pay and insurance. They do have a program for patients with no income.   [facility]  [facility] office) [phone] Will accept self-pay and  can complete the Financial Assistance paper work for [employer].   Patient stated that she will follow up with each resource to see if they will be able to assist her. Asked patient if she had any additional needs at this time. Patient stated no additional needs.   Closing referral pending any other needs of patient.   [place], Care Management Phone: [phone] Email: [email or web address]

## 2020-01-09 NOTE — Telephone Encounter (Signed)
[facility] on January 07, 2020. Spoke with receptionist she stated they accept both self-pay and insurance and that they have program for patients that do not have income. Care Guide will give [name] the resource.

## 2020-01-21 ENCOUNTER — Other Ambulatory Visit: Payer: Self-pay

## 2020-01-21 ENCOUNTER — Ambulatory Visit (INDEPENDENT_AMBULATORY_CARE_PROVIDER_SITE_OTHER): Payer: Medicaid Other | Admitting: Obstetrics and Gynecology

## 2020-01-21 ENCOUNTER — Other Ambulatory Visit: Payer: Self-pay | Admitting: Obstetrics and Gynecology

## 2020-01-21 ENCOUNTER — Encounter: Payer: Self-pay | Admitting: Obstetrics and Gynecology

## 2020-01-21 VITALS — BP 153/78 | HR 118 | Ht 69.0 in | Wt 358.8 lb

## 2020-01-21 DIAGNOSIS — T8332XA Displacement of intrauterine contraceptive device, initial encounter: Secondary | ICD-10-CM

## 2020-01-21 DIAGNOSIS — Z01419 Encounter for gynecological examination (general) (routine) without abnormal findings: Secondary | ICD-10-CM

## 2020-01-21 DIAGNOSIS — Z6841 Body Mass Index (BMI) 40.0 and over, adult: Secondary | ICD-10-CM

## 2020-01-21 NOTE — Progress Notes (Signed)
HPI:      [name] is a 44 y.o. [phone] who LMP was No LMP recorded. (Menstrual status: IUD).  Subjective:   She presents today for her annual examination.  She is here referred by [facility].  She states that she had a mammogram 2 weeks ago and it was normal. She says she occasionally feels a bump for the last 2 months on her left labia but has no other issues from this. She says that she has a Mirena IUD in place (third one consecutively) she reports no menstrual periods with her IUD.  She has determined that her IUD is 5-1/2 years in place and would like it removed and replaced in the near future.    Hx: The following portions of the patient's history were reviewed and updated as appropriate:             She  has a past medical history of Anxiety, Bipolar 1 disorder ([place]), Bipolar affective disorder, depressed, severe ([place]), and Depression. She does not have any pertinent problems on file. She  has a past surgical history that includes Dilation and curettage of uterus and Breast biopsy (Right). Her family history includes Bipolar disorder in her father; Gout in her mother; Kidney disease in her mother; Melanoma in her paternal uncle; Rheum arthritis in her mother; Thyroid disease in her mother. She  reports that she has never smoked. She has never used smokeless tobacco. She reports previous alcohol use. She reports that she does not use drugs. She has a current medication list which includes the following prescription(s): abilify maintena, acetaminophen, albuterol, alprazolam, amphetamine-dextroamphetamine, clonazepam, cyclobenzaprine, fexofenadine, furosemide, lamotrigine, melatonin, meloxicam, omeprazole, and triamcinolone acetonide. She is allergic to drug ingredient [black walnut pollen allergy skin test], hazelnut (filbert) allergy skin test, pecan extract allergy skin test, and shellfish allergy.       Review of Systems:  Review of Systems  Constitutional: Denied constitutional  symptoms, night sweats, recent illness, fatigue, fever, insomnia and weight loss.  Eyes: Denied eye symptoms, eye pain, photophobia, vision change and visual disturbance.  Ears/Nose/Throat/Neck: Denied ear, nose, throat or neck symptoms, hearing loss, nasal discharge, sinus congestion and sore throat.  Cardiovascular: Denied cardiovascular symptoms, arrhythmia, chest pain/pressure, edema, exercise intolerance, orthopnea and palpitations.  Respiratory: Denied pulmonary symptoms, asthma, pleuritic pain, productive sputum, cough, dyspnea and wheezing.  Gastrointestinal: Denied, gastro-esophageal reflux, melena, nausea and vomiting.  Genitourinary: See HPI for additional information.  Musculoskeletal: Denied musculoskeletal symptoms, stiffness, swelling, muscle weakness and myalgia.  Dermatologic: Denied dermatology symptoms, rash and scar.  Neurologic: Denied neurology symptoms, dizziness, headache, neck pain and syncope.  Psychiatric: Denied psychiatric symptoms, anxiety and depression.  Endocrine: Denied endocrine symptoms including hot flashes and night sweats.   Meds:   Current Outpatient Medications on File Prior to Visit  Medication Sig Dispense Refill  . ABILIFY MAINTENA 400 MG PRSY INTRAMUSCULAR EVERY 4 WEEKS  3  . acetaminophen (TYLENOL) 500 MG tablet Take 1,000 mg by mouth every 4 (four) hours as needed for headache.    . albuterol (VENTOLIN HFA) 108 (90 Base) MCG/ACT inhaler INHALE 2 PUFFS INTO LUNGS EVERY 4 HOURS AS NEEDED FOR WHEEZING OR SHORTNESS OF BREATH (COUGH). 8.5 g 1  . ALPRAZolam (XANAX) 1 MG tablet Take 1 mg by mouth at bedtime as needed for anxiety.    Marland Kitchen amphetamine-dextroamphetamine (ADDERALL) 30 MG tablet Take 30 mg by mouth daily.    . clonazePAM (KLONOPIN) 0.5 MG tablet Take 0.5 mg by mouth 5 (five) times daily.    Marland Kitchen  cyclobenzaprine (FLEXERIL) 10 MG tablet Take 1 tablet (10 mg total) by mouth 3 (three) times daily as needed for muscle spasms. 90 tablet 2  .  fexofenadine (ALLEGRA) 180 MG tablet Take 180 mg by mouth daily as needed for allergies or rhinitis.    . furosemide (LASIX) 20 MG tablet TAKE 1 TAB BY MOUTH EVERY DAY FOR SWELLING, MAY INCREASE TO 2 TABS IF NEED FOR MAX 7 DAYS PER FLARE 90 tablet 0  . lamoTRIgine (LAMICTAL) 100 MG tablet Take 200 mg by mouth 2 (two) times daily. Taking 200 mg (2 tablets) each morning and 150 mg (1.5 tablets) each evening    . melatonin 3 MG TABS tablet Take 3 mg by mouth at bedtime.    . meloxicam (MOBIC) 15 MG tablet Take 1 tablet (15 mg total) by mouth daily. 30 tablet 2  . omeprazole (PRILOSEC) 20 MG capsule Take 1 capsule (20 mg total) by mouth daily as needed. 30 capsule 2  . Triamcinolone Acetonide (NASACORT ALLERGY 24HR NA) Place into the nose daily as needed.     No current facility-administered medications on file prior to visit.    Objective:     Vitals:   01/21/20 0917  BP: (!) 153/78  Pulse: (!) 118              Physical examination   Pelvic:   Vulva: Normal appearance.  No lesions.  Vagina: No lesions or abnormalities noted.  Support: Normal pelvic support.  Urethra No masses tenderness or scarring.  Meatus Normal size without lesions or prolapse.  Cervix: Normal appearance.  No lesions.  Anus: Normal exam.  No lesions.  Perineum: Normal exam.  No lesions.        Bimanual   Uterus: Normal size.  Non-tender.  Mobile.  AV.  Adnexae: No masses.  Non-tender to palpation.  Cul-de-sac: Negative for abnormality.   Examination limited by patient body habitus  Assessment:    [id number] Patient Active Problem List   Diagnosis Date Noted  . GERD (gastroesophageal reflux disease) 09/25/2019  . Epidermal cyst of neck 06/13/2017  . Atypical mole 10/05/2016  . Bilateral lower extremity edema 06/15/2016  . Pain and swelling of left lower leg 06/15/2016  . Pain in joint, multiple sites 06/15/2016  . Family history of rheumatoid arthritis 06/15/2016  . Chronic bilateral low back pain with  right-sided sciatica 05/24/2016  . Left medial knee pain 05/23/2016  . Anxiety 05/23/2016  . Bipolar affective disorder, depressed, severe ([place]) 05/23/2016  . Morbid obesity with BMI of 50.0-59.9, adult ([place]) 05/23/2016  . Hyperlipidemia 05/23/2016     1. Well woman exam with routine gynecological exam   2. Morbid obesity with BMI of 50.0-59.9, adult ([place])   3. Intrauterine contraceptive device threads lost, initial encounter     No evidence of labial abnormality especially in the locale patient was describing.  IUD strings not visible at cervical os and not detected during Cytobrush Pap smear   Plan:            1.  Basic Screening Recommendations The basic screening recommendations for asymptomatic women were discussed with the patient during her visit.  The age-appropriate recommendations were discussed with her and the rational for the tests reviewed.  When I am informed by the patient that another primary care physician has previously obtained the age-appropriate tests and they are up-to-date, only outstanding tests are ordered and referrals given as necessary.  Abnormal results of tests will be discussed with her  when all of her results are completed.  Routine preventative health maintenance measures emphasized: Exercise/Diet/Weight control, Tobacco Warnings, Alcohol/Substance use risks and Stress Management Pap performed  2.  Patient desires removal and replacement of IUD.  I suspect we will find the strings and be able to remove the IUD without difficulty.  Patient to schedule when convenient for removal and replacement. Orders No orders of the defined types were placed in this encounter.   No orders of the defined types were placed in this encounter.       F/U  No follow-ups on file.  Finis Bud, M.D. 01/21/2020 9:47 AM

## 2020-01-27 ENCOUNTER — Telehealth: Payer: Self-pay

## 2020-01-27 ENCOUNTER — Ambulatory Visit: Payer: Self-pay | Admitting: Pharmacist

## 2020-01-27 NOTE — Chronic Care Management (AMB) (Signed)
  Care Management   Follow Up Note   01/27/2020 Name: Maria Jacobson MRN: [id number] DOB: 17-May-1976  Referred by: [name], DO Reason for referral : Chronic Care Management (Patient Phone Call)   Maria Jacobson is a 44 y.o. year old female who is a primary care patient of [name], DO. The care management team was consulted for assistance with care management and care coordination needs.    Was unable to reach patient via telephone today and have left HIPAA compliant voicemail asking patient to return my call. Outreach attempt #2.  Plan  The care management team will reach out to the patient again over the next 60 days.   Harlow Asa, PharmD, [place] [employer] [phone]

## 2020-01-28 LAB — IGP, APTIMA HPV, RFX 16/18,45: HPV Aptima: NEGATIVE

## 2020-01-28 LAB — GYN REPORT

## 2020-02-04 ENCOUNTER — Telehealth: Payer: Self-pay | Admitting: Obstetrics and Gynecology

## 2020-02-04 NOTE — Telephone Encounter (Signed)
Left message. Told pt about appt moved to 9:45.

## 2020-02-05 ENCOUNTER — Ambulatory Visit: Payer: Medicaid Other | Admitting: Obstetrics and Gynecology

## 2020-02-06 ENCOUNTER — Encounter: Payer: Self-pay | Admitting: Obstetrics and Gynecology

## 2020-02-06 ENCOUNTER — Ambulatory Visit (INDEPENDENT_AMBULATORY_CARE_PROVIDER_SITE_OTHER): Payer: Medicaid Other | Admitting: Obstetrics and Gynecology

## 2020-02-06 VITALS — BP 134/84 | HR 108 | Ht 69.0 in | Wt 350.4 lb

## 2020-02-06 DIAGNOSIS — Z3009 Encounter for other general counseling and advice on contraception: Secondary | ICD-10-CM | POA: Diagnosis not present

## 2020-02-06 DIAGNOSIS — Z30433 Encounter for removal and reinsertion of intrauterine contraceptive device: Secondary | ICD-10-CM | POA: Diagnosis not present

## 2020-02-06 NOTE — Progress Notes (Signed)
HPI:      [name] is a 44 y.o. [phone] who LMP was No LMP recorded. (Menstrual status: IUD).  Subjective:   She presents today for IUD removal and re- insertion.  Her Mirena IUD has expired and desires another one for birth control.    Hx: The following portions of the patient's history were reviewed and updated as appropriate:             She  has a past medical history of Anxiety, Bipolar 1 disorder ([place]), Bipolar affective disorder, depressed, severe ([place]), and Depression. She does not have any pertinent problems on file. She  has a past surgical history that includes Dilation and curettage of uterus and Breast biopsy (Right). Her family history includes Bipolar disorder in her father; Gout in her mother; Kidney disease in her mother; Melanoma in her paternal uncle; Rheum arthritis in her mother; Thyroid disease in her mother. She  reports that she has never smoked. She has never used smokeless tobacco. She reports previous alcohol use. She reports that she does not use drugs. She has a current medication list which includes the following prescription(s): abilify maintena, acetaminophen, albuterol, alprazolam, amphetamine-dextroamphetamine, clonazepam, cyclobenzaprine, fexofenadine, furosemide, lamotrigine, melatonin, meloxicam, omeprazole, and triamcinolone acetonide. She is allergic to drug ingredient [black walnut pollen allergy skin test], hazelnut (filbert) allergy skin test, pecan extract allergy skin test, and shellfish allergy.       Review of Systems:  Review of Systems  Constitutional: Denied constitutional symptoms, night sweats, recent illness, fatigue, fever, insomnia and weight loss.  Eyes: Denied eye symptoms, eye pain, photophobia, vision change and visual disturbance.  Ears/Nose/Throat/Neck: Denied ear, nose, throat or neck symptoms, hearing loss, nasal discharge, sinus congestion and sore throat.  Cardiovascular: Denied cardiovascular symptoms, arrhythmia, chest  pain/pressure, edema, exercise intolerance, orthopnea and palpitations.  Respiratory: Denied pulmonary symptoms, asthma, pleuritic pain, productive sputum, cough, dyspnea and wheezing.  Gastrointestinal: Denied, gastro-esophageal reflux, melena, nausea and vomiting.  Genitourinary: Denied genitourinary symptoms including symptomatic vaginal discharge, pelvic relaxation issues, and urinary complaints.  Musculoskeletal: Denied musculoskeletal symptoms, stiffness, swelling, muscle weakness and myalgia.  Dermatologic: Denied dermatology symptoms, rash and scar.  Neurologic: Denied neurology symptoms, dizziness, headache, neck pain and syncope.  Psychiatric: Denied psychiatric symptoms, anxiety and depression.  Endocrine: Denied endocrine symptoms including hot flashes and night sweats.   Meds:   Current Outpatient Medications on File Prior to Visit  Medication Sig Dispense Refill   ABILIFY MAINTENA 400 MG PRSY INTRAMUSCULAR EVERY 4 WEEKS  3   acetaminophen (TYLENOL) 500 MG tablet Take 1,000 mg by mouth every 4 (four) hours as needed for headache.     albuterol (VENTOLIN HFA) 108 (90 Base) MCG/ACT inhaler INHALE 2 PUFFS INTO LUNGS EVERY 4 HOURS AS NEEDED FOR WHEEZING OR SHORTNESS OF BREATH (COUGH). 8.5 g 1   ALPRAZolam (XANAX) 1 MG tablet Take 1 mg by mouth at bedtime as needed for anxiety.     amphetamine-dextroamphetamine (ADDERALL) 30 MG tablet Take 30 mg by mouth daily.     clonazePAM (KLONOPIN) 0.5 MG tablet Take 0.5 mg by mouth 5 (five) times daily.     cyclobenzaprine (FLEXERIL) 10 MG tablet Take 1 tablet (10 mg total) by mouth 3 (three) times daily as needed for muscle spasms. 90 tablet 2   fexofenadine (ALLEGRA) 180 MG tablet Take 180 mg by mouth daily as needed for allergies or rhinitis.     furosemide (LASIX) 20 MG tablet TAKE 1 TAB BY MOUTH EVERY DAY FOR SWELLING,  MAY INCREASE TO 2 TABS IF NEED FOR MAX 7 DAYS PER FLARE 90 tablet 0   lamoTRIgine (LAMICTAL) 100 MG tablet Take  200 mg by mouth 2 (two) times daily. Taking 200 mg (2 tablets) each morning and 150 mg (1.5 tablets) each evening     melatonin 3 MG TABS tablet Take 3 mg by mouth at bedtime.     meloxicam (MOBIC) 15 MG tablet Take 1 tablet (15 mg total) by mouth daily. 30 tablet 2   omeprazole (PRILOSEC) 20 MG capsule Take 1 capsule (20 mg total) by mouth daily as needed. 30 capsule 2   Triamcinolone Acetonide (NASACORT ALLERGY 24HR NA) Place into the nose daily as needed.     No current facility-administered medications on file prior to visit.    Objective:     Vitals:   02/06/20 0942  BP: 134/84  Pulse: (!) 108    Physical examination   Pelvic:   Vulva: Normal appearance.  No lesions.  Vagina: No lesions or abnormalities noted.  Support: Normal pelvic support.  Urethra No masses tenderness or scarring.  Meatus Normal size without lesions or prolapse.  Cervix: Normal appearance.  No lesions.  Anus: Normal exam.  No lesions.  Perineum: Normal exam.  No lesions.        Bimanual   Uterus: Normal size.  Non-tender.  Mobile.  AV.  Adnexae: No masses.  Non-tender to palpation.  Cul-de-sac: Negative for abnormality.   IUD Removal Strings of IUD identified and grasped.  IUD removed without problem.  Pt tolerated this well.  IUD noted to be intact.  IUD Procedure Pt has read the booklet and signed the appropriate forms regarding the Mirena IUD.  All of her questions have been answered.   The cervix was cleansed with betadine solution.  After sounding the uterus and noting the position, the IUD was placed in the usual manner without problem.  The string was cut to the appropriate length.  The patient tolerated the procedure well.    Assessment:    [id number] Patient Active Problem List   Diagnosis Date Noted   GERD (gastroesophageal reflux disease) 09/25/2019   Epidermal cyst of neck 06/13/2017   Atypical mole 10/05/2016   Bilateral lower extremity edema 06/15/2016   Pain and swelling  of left lower leg 06/15/2016   Pain in joint, multiple sites 06/15/2016   Family history of rheumatoid arthritis 06/15/2016   Chronic bilateral low back pain with right-sided sciatica 05/24/2016   Left medial knee pain 05/23/2016   Anxiety 05/23/2016   Bipolar affective disorder, depressed, severe ([place]) 05/23/2016   Morbid obesity with BMI of 50.0-59.9, adult ([place]) 05/23/2016   Hyperlipidemia 05/23/2016     1. Encounter for IUD removal and reinsertion   2. Birth control counseling       Plan:             F/U  Return in about 4 weeks (around 03/05/2020) for For IUD f/u. I spent 12 minutes involved in the care of this patient preparing to see the patient by obtaining and reviewing her medical history (including labs, imaging tests and prior procedures), documenting clinical information in the electronic health record (EHR), counseling and coordinating care plans, writing and sending prescriptions, ordering tests or procedures and directly communicating with the patient by discussing pertinent items from her history and physical exam as well as detailing my assessment and plan as noted above so that she has an informed understanding.  All of her questions  were answered.  Finis Bud, M.D. 02/06/2020 10:19 AM

## 2020-02-07 ENCOUNTER — Other Ambulatory Visit: Payer: Self-pay | Admitting: Family Medicine

## 2020-02-07 DIAGNOSIS — G8929 Other chronic pain: Secondary | ICD-10-CM

## 2020-02-07 DIAGNOSIS — M25562 Pain in left knee: Secondary | ICD-10-CM

## 2020-02-07 NOTE — Telephone Encounter (Signed)
Requested medication (s) are due for refill today: yes  Requested medication (s) are on the active medication list: yes  Last refill:  11/15/19 #90 with 2 refills  Future visit scheduled: yes  Notes to clinic:  Please review for refill.Refill not delegated per protocol    Requested Prescriptions  Pending Prescriptions Disp Refills   cyclobenzaprine (FLEXERIL) 10 MG tablet [Pharmacy Med Name: CYCLOBENZAPRINE 10 MG TABLET] 90 tablet 1    Sig: TAKE ONE TABLET BY MOUTH THREE TIMES A DAY AS NEEDED FOR MUSCLE SPASMS      Not Delegated - Analgesics:  Muscle Relaxants Failed - 02/07/2020 12:11 PM      Failed - This refill cannot be delegated      Passed - Valid encounter within last 6 months    Recent Outpatient Visits           2 months ago Morbid obesity with BMI of 50.0-59.9, adult [facility])   [place], DO   4 months ago Foul smelling urine   [place], DO   12 months ago Acute bilateral low back pain without sciatica   [place], DO   1 year ago Left medial knee pain   [place], DO   1 year ago Left medial knee pain   [place], DO               Signed Prescriptions Disp Refills   meloxicam (MOBIC) 15 MG tablet 30 tablet 1    Sig: TAKE ONE TABLET BY MOUTH DAILY      Analgesics:  COX2 Inhibitors Passed - 02/07/2020 12:11 PM      Passed - HGB in normal range and within 360 days    Hemoglobin  Date Value Ref Range Status  10/28/2019 14.9 11.7 - 15.5 g/dL Final          Passed - Cr in normal range and within 360 days    Creat  Date Value Ref Range Status  10/28/2019 0.95 0.50 - 1.10 mg/dL Final          Passed - Patient is not pregnant      Passed - Valid encounter within last 12 months    Recent Outpatient Visits           2 months ago Morbid obesity  with BMI of 50.0-59.9, adult [facility])   [place], DO   4 months ago Foul smelling urine   [place], DO   12 months ago Acute bilateral low back pain without sciatica   [place], DO   1 year ago Left medial knee pain   [place], DO   1 year ago Left medial knee pain   [place], Devonne [place], [place]

## 2020-02-10 ENCOUNTER — Telehealth: Payer: Medicaid Other | Admitting: General Practice

## 2020-02-10 ENCOUNTER — Ambulatory Visit: Payer: Self-pay | Admitting: General Practice

## 2020-02-10 DIAGNOSIS — M5441 Lumbago with sciatica, right side: Secondary | ICD-10-CM

## 2020-02-10 DIAGNOSIS — M2550 Pain in unspecified joint: Secondary | ICD-10-CM

## 2020-02-10 DIAGNOSIS — F419 Anxiety disorder, unspecified: Secondary | ICD-10-CM

## 2020-02-10 DIAGNOSIS — E782 Mixed hyperlipidemia: Secondary | ICD-10-CM

## 2020-02-10 DIAGNOSIS — M25562 Pain in left knee: Secondary | ICD-10-CM

## 2020-02-10 DIAGNOSIS — F314 Bipolar disorder, current episode depressed, severe, without psychotic features: Secondary | ICD-10-CM

## 2020-02-10 NOTE — Patient Instructions (Signed)
Visit Information  Goals Addressed              This Visit's Progress     RNCM: pt-"Most days I have to stay in bed due to the chronic pain I have" (pt-stated)        CARE PLAN ENTRY (see longtitudinal plan of care for additional care plan information)  Current Barriers:   Chronic Disease Management support, education, and care coordination needs related to HLD, Anxiety, Depression, Bipolar Disorder, and Chronic pain  Financial constraints  ADL/IADL limitations  Clinical Goal(s) related to HLD, Anxiety, Depression, Bipolar Disorder, and chronic pain :  Over the next 120 days, patient will:   Work with the care management team to address educational, disease management, and care coordination needs   Begin or continue self health monitoring activities as directed today  adhere to a heart healthy diet and utilize coping mechanisms for depression, anxiety, and bipolar episodes   Call provider office for new or worsened signs and symptoms Shortness of breath and New or worsened symptom related to swelling in legs, anxiety, depression and biploar   Call care management team with questions or concerns  Verbalize basic understanding of patient centered plan of care established today  Interventions related to HLD, Anxiety, Depression, Bipolar Disorder, and Chronic pain  :   Evaluation of current treatment plans and patient's adherence to plan as established by provider.  The patient is wanting to make positive changes in her life that will help her improve her health and well being. The patient is working with the [facility] and CCM team to meet her health and well being goals. Making positive changes.  Since last call the patient has lost about 14 pounds by altering dietary intake. 02-10-2020: The patient continues to make progress in her health and well being. She say OB/GYN last week and will see the dentist this week to have 2 cavities fixed.  Her appointments are on Wednesday and Thursday.  The patient will then be ready to have her orthopedic appointment.   Assessed patient understanding of disease states.  The patient has a good understanding of her chronic conditions. She sees pcp and psychiatrist.  Willing to work with the CCM team to help with the chronic conditions she has.  Assessed patient's education and care coordination needs.  The patient is unable to work due to her health. She needs help with filing for disability and charity care so she can be seen by orthopedic provider.  The patient also needs dental work. Care guide referral for assistance in applying for disability, charity care and dental resources in [place]. The patient has an appointment on 01-02-2020 to get her ID card updated. This should be the last document she needs to complete the charity care application.  The patient is calling the CCM team care guides back today to help with disability paperwork and other needs. The patient wants to do what she can to improve her health and well being. Praised for accomplishments. 02-10-2020: The patient has her ID card and is completing paperwork and ready for submission this week. The patient is doing good and has made significant progress in her health and wellness goals. She has been able to get out and visit with her grandmother and her father and this was very beneficial for her. The patient states that she wants to be able to feel better so she can help herself and others. Encouraged the patient to keep striving for her goals.  Provided disease specific education to patient.  Education on the benefits of Heart healthy diet.  The patient is ready to make dietary changes to help with weight reduction, decrease swelling in her legs, and manage her HLD more effectively.  Will send education material through the My chart system and EMMI for heart healthy diet. The patient has cut out red meats and has lost 14 pounds since the last outreach with the [facility]. Praised for  accomplishments.   Assessed mammogram appointment. The patient had a "BSAP" appointment last week but had to cancel due to a family friend passing away.  The patient has a new appointment scheduled for June 1st.  The patient is wanting to get back on track with her health and is doing great things toward improving her health and well being.   Assessed the patients pain level today. She rates her pain at "miserable"  The patient says the pain is unmanageable at times and she has to stay in bed all  day. She is very limited in her mobility.  The patient is open to ideas and suggestions to help with pain control.  Working with the CCM team care guides to get approved for charity care and disability. 02-10-2020: The patient is still having pain but it varies. She is working on getting an appointment with orthopedic doctor so her pain issues can be addressed. She knows if she can be more mobile this will help her with other areas of her health. She is optimistic about her future and improved health and wellness.   Collaborated with appropriate clinical care team members regarding patient needs  Referral placed for [facility]ist[facility] and any recommendations for improved help and well being/pain control  Referral for LCSW: The patient has a long standing history of anxiety, depression, and bipolar disorder. The patient also needs assistance and guidance on charity care. The patient has a psychiatrist but welcomes any support and help in dealing with her conditions. She has a significant other, "[name]", that is very active in her care and a great support to the patient.   Assessed and reviewed medications with the patient . The patient is compliant with current medication regimen. Updated list today.   Encouraged the patient to reach out to the care guide team to get help with paperwork.  The patient is going to call today.  Also the [facility] will follow up on referral for LCSW.  The patient is thankful for  the CCM support and help to get what she needs to take better care of herself. The patient states she is motivated to do what she can to help herself and improve her health and well being.   Patient Self Care Activities related to HLD, Anxiety, Depression, Bipolar Disorder, and Chronic pain :   Patient is unable to independently self-manage chronic health conditions  Please see past updates related to this goal by clicking on the "Past Updates" button in the selected goal         Patient verbalizes understanding of instructions provided today.   The care management team will reach out to the patient again over the next 30 to 60 days.   Noreene Larsson RN, MSN, [place] Mobile: [phone]

## 2020-02-10 NOTE — Chronic Care Management (AMB) (Signed)
Care Management   Follow Up Note   02/10/2020 Name: Maria Jacobson MRN: [id number] DOB: [birth year]  Referred by: [name], DO Reason for referral : Care Coordination (RNCM Follow up: Chronic Disease Management and Care Coordination Needs)   Maria Jacobson is a 44 y.o. year old female who is a primary care patient of [name], DO. The care management team was consulted for assistance with care management and care coordination needs.    Review of patient status, including review of consultants reports, relevant laboratory and other test results, and collaboration with appropriate care team members and the patient's provider was performed as part of comprehensive patient evaluation and provision of chronic care management services.    SDOH (Social Determinants of Health) assessments performed: Yes See Care Plan activities for detailed interventions related to [facility])     Advanced Directives: See Care Plan and Vynca application for related entries.   Goals Addressed              This Visit's Progress     RNCM: pt-"Most days I have to stay in bed due to the chronic pain I have" (pt-stated)        CARE PLAN ENTRY (see longtitudinal plan of care for additional care plan information)  Current Barriers:   Chronic Disease Management support, education, and care coordination needs related to HLD, Anxiety, Depression, Bipolar Disorder, and Chronic pain  Financial constraints  ADL/IADL limitations  Clinical Goal(s) related to HLD, Anxiety, Depression, Bipolar Disorder, and chronic pain :  Over the next 120 days, patient will:   Work with the care management team to address educational, disease management, and care coordination needs   Begin or continue self health monitoring activities as directed today  adhere to a heart healthy diet and utilize coping mechanisms for depression, anxiety, and bipolar episodes   Call provider office for new or worsened  signs and symptoms Shortness of breath and New or worsened symptom related to swelling in legs, anxiety, depression and biploar   Call care management team with questions or concerns  Verbalize basic understanding of patient centered plan of care established today  Interventions related to HLD, Anxiety, Depression, Bipolar Disorder, and Chronic pain  :   Evaluation of current treatment plans and patient's adherence to plan as established by provider.  The patient is wanting to make positive changes in her life that will help her improve her health and well being. The patient is working with the [facility] and CCM team to meet her health and well being goals. Making positive changes.  Since last call the patient has lost about 14 pounds by altering dietary intake. 02-10-2020: The patient continues to make progress in her health and well being. She say OB/GYN last week and will see the dentist this week to have 2 cavities fixed.  Her appointments are on Wednesday and Thursday. The patient will then be ready to have her orthopedic appointment.   Assessed patient understanding of disease states.  The patient has a good understanding of her chronic conditions. She sees pcp and psychiatrist.  Willing to work with the CCM team to help with the chronic conditions she has.  Assessed patient's education and care coordination needs.  The patient is unable to work due to her health. She needs help with filing for disability and charity care so she can be seen by orthopedic provider.  The patient also needs dental work. Care guide referral for assistance in applying for disability, charity care  and dental resources in [place]. The patient has an appointment on 01-02-2020 to get her ID card updated. This should be the last document she needs to complete the charity care application.  The patient is calling the CCM team care guides back today to help with disability paperwork and other needs. The patient wants to do  what she can to improve her health and well being. Praised for accomplishments. 02-10-2020: The patient has her ID card and is completing paperwork and ready for submission this week. The patient is doing good and has made significant progress in her health and wellness goals. She has been able to get out and visit with her grandmother and her father and this was very beneficial for her. The patient states that she wants to be able to feel better so she can help herself and others. Encouraged the patient to keep striving for her goals.   Provided disease specific education to patient.  Education on the benefits of Heart healthy diet.  The patient is ready to make dietary changes to help with weight reduction, decrease swelling in her legs, and manage her HLD more effectively.  Will send education material through the My chart system and EMMI for heart healthy diet. The patient has cut out red meats and has lost 14 pounds since the last outreach with the [facility]. Praised for accomplishments.   Assessed mammogram appointment. The patient had a "BSAP" appointment last week but had to cancel due to a family friend passing away.  The patient has a new appointment scheduled for June 1st.  The patient is wanting to get back on track with her health and is doing great things toward improving her health and well being.   Assessed the patients pain level today. She rates her pain at "miserable"  The patient says the pain is unmanageable at times and she has to stay in bed all  day. She is very limited in her mobility.  The patient is open to ideas and suggestions to help with pain control.  Working with the CCM team care guides to get approved for charity care and disability. 02-10-2020: The patient is still having pain but it varies. She is working on getting an appointment with orthopedic doctor so her pain issues can be addressed. She knows if she can be more mobile this will help her with other areas of her health. She  is optimistic about her future and improved health and wellness.   Collaborated with appropriate clinical care team members regarding patient needs  Referral placed for [facility]ist: [facility] and any recommendations for improved help and well being/pain control  Referral for LCSW: The patient has a long standing history of anxiety, depression, and bipolar disorder. The patient also needs assistance and guidance on charity care. The patient has a psychiatrist but welcomes any support and help in dealing with her conditions. She has a significant other, "[name]", that is very active in her care and a great support to the patient.   Assessed and reviewed medications with the patient . The patient is compliant with current medication regimen. Updated list today.   Encouraged the patient to reach out to the care guide team to get help with paperwork.  The patient is going to call today.  Also the [facility] will follow up on referral for LCSW.  The patient is thankful for the CCM support and help to get what she needs to take better care of herself. The patient states she is  motivated to do what she can to help herself and improve her health and well being.   Patient Self Care Activities related to HLD, Anxiety, Depression, Bipolar Disorder, and Chronic pain :   Patient is unable to independently self-manage chronic health conditions  Please see past updates related to this goal by clicking on the "Past Updates" button in the selected goal          The care management team will reach out to the patient again over the next 30 to 60 days.   Noreene Larsson RN, MSN, [place] Mobile: [phone]

## 2020-02-11 ENCOUNTER — Ambulatory Visit: Payer: Self-pay

## 2020-02-11 NOTE — Chronic Care Management (AMB) (Signed)
  Care Management   Follow Up Note   02/11/2020 Name: Maria Jacobson MRN: [id number] DOB: 24-Oct-1975  Referred by: [name], DO Reason for referral : Care Coordination   Maria Jacobson is a 44 y.o. year old female who is a primary care patient of [name], DO. The care management team was consulted for assistance with care management and care coordination needs.    Review of patient status, including review of consultants reports, relevant laboratory and other test results, and collaboration with appropriate care team members and the patient's provider was performed as part of comprehensive patient evaluation and provision of chronic care management services.    LCSW completed CCM outreach attempt today but was unable to reach patient successfully. A HIPPA compliant voice message was left encouraging patient to return call once available. LCSW rescheduled CCM SW appointment as well.  A HIPPA compliant phone message was left for the patient providing contact information and requesting a return call.   Eula Fried, BSW, MSW, [email or web address] Phone: [phone]

## 2020-03-05 ENCOUNTER — Encounter: Payer: Medicaid Other | Admitting: Obstetrics and Gynecology

## 2020-03-05 ENCOUNTER — Telehealth: Payer: Self-pay

## 2020-03-18 ENCOUNTER — Encounter: Payer: Medicaid Other | Admitting: Obstetrics and Gynecology

## 2020-03-23 ENCOUNTER — Telehealth: Payer: Self-pay | Admitting: Pharmacist

## 2020-03-23 ENCOUNTER — Telehealth: Payer: Self-pay

## 2020-03-23 NOTE — Telephone Encounter (Signed)
  Chronic Care Management   Outreach Note  03/23/2020 Name: Maria Jacobson MRN: [id number] DOB: Aug 27, 1975  Referred by: [name], DO Reason for referral : No chief complaint on file.   Third unsuccessful telephone outreach was attempted today. The patient was referred to the case management team for assistance with care management and care coordination. The patient's primary care provider has been notified of our unsuccessful attempts to make or maintain contact with the patient. The care management team is pleased to engage with this patient at any time in the future should he/she be interested in assistance from the care management team.    Harlow Asa, PharmD, [place] Management [phone]

## 2020-03-31 ENCOUNTER — Telehealth: Payer: Self-pay | Admitting: Licensed Clinical Social Worker

## 2020-03-31 ENCOUNTER — Telehealth: Payer: Self-pay

## 2020-03-31 NOTE — Telephone Encounter (Signed)
  Chronic Care Management    Clinical Social Work General Follow Up Note  03/31/2020 Name: Maria Jacobson MRN: [id number] DOB: 1975-10-05  Maria Jacobson is a 44 y.o. year old female who is a primary care patient of [name], DO. The CCM team was consulted for assistance with [employer] .   Review of patient status, including review of consultants reports, relevant laboratory and other test results, and collaboration with appropriate care team members and the patient's provider was performed as part of comprehensive patient evaluation and provision of chronic care management services.    LCSW completed CCM outreach attempt today but was unable to reach patient successfully. A HIPPA compliant voice message was left encouraging patient to return call once available. LCSW will ask Scheduling Care Guide to reach out to patient and reschedule CCM Social Work appointment.   Outpatient Encounter Medications as of 03/31/2020  Medication Sig Note  . ABILIFY MAINTENA 400 MG PRSY INTRAMUSCULAR EVERY 4 WEEKS   . acetaminophen (TYLENOL) 500 MG tablet Take 1,000 mg by mouth every 4 (four) hours as needed for headache.   . albuterol (VENTOLIN HFA) 108 (90 Base) MCG/ACT inhaler INHALE 2 PUFFS INTO LUNGS EVERY 4 HOURS AS NEEDED FOR WHEEZING OR SHORTNESS OF BREATH (COUGH).   Marland Kitchen ALPRAZolam (XANAX) 1 MG tablet Take 1 mg by mouth at bedtime as needed for anxiety.   Marland Kitchen amphetamine-dextroamphetamine (ADDERALL) 30 MG tablet Take 30 mg by mouth daily. 12/04/2019: QAM  . clonazePAM (KLONOPIN) 0.5 MG tablet Take 0.5 mg by mouth 5 (five) times daily.   . cyclobenzaprine (FLEXERIL) 10 MG tablet TAKE ONE TABLET BY MOUTH THREE TIMES A DAY AS NEEDED FOR MUSCLE SPASMS   . fexofenadine (ALLEGRA) 180 MG tablet Take 180 mg by mouth daily as needed for allergies or rhinitis.   . furosemide (LASIX) 20 MG tablet TAKE 1 TAB BY MOUTH EVERY DAY FOR SWELLING, MAY INCREASE TO 2 TABS IF NEED FOR MAX 7 DAYS PER FLARE   .  lamoTRIgine (LAMICTAL) 100 MG tablet Take 200 mg by mouth 2 (two) times daily. Taking 200 mg (2 tablets) each morning and 150 mg (1.5 tablets) each evening   . melatonin 3 MG TABS tablet Take 3 mg by mouth at bedtime.   . meloxicam (MOBIC) 15 MG tablet TAKE ONE TABLET BY MOUTH DAILY   . omeprazole (PRILOSEC) 20 MG capsule Take 1 capsule (20 mg total) by mouth daily as needed.   . Triamcinolone Acetonide (NASACORT ALLERGY 24HR NA) Place into the nose daily as needed.    No facility-administered encounter medications on file as of 03/31/2020.    Follow Up Plan: [facility] will reach out to patient to reschedule appointment.   Eula Fried, BSW, MSW, [email or web address] Phone: [phone]

## 2020-04-06 ENCOUNTER — Other Ambulatory Visit: Payer: Self-pay | Admitting: Family Medicine

## 2020-04-06 DIAGNOSIS — M5441 Lumbago with sciatica, right side: Secondary | ICD-10-CM

## 2020-04-06 DIAGNOSIS — J9801 Acute bronchospasm: Secondary | ICD-10-CM

## 2020-04-06 DIAGNOSIS — M25562 Pain in left knee: Secondary | ICD-10-CM

## 2020-04-07 DIAGNOSIS — M5441 Lumbago with sciatica, right side: Secondary | ICD-10-CM

## 2020-04-07 DIAGNOSIS — M25562 Pain in left knee: Secondary | ICD-10-CM

## 2020-04-07 DIAGNOSIS — J9801 Acute bronchospasm: Secondary | ICD-10-CM

## 2020-04-07 MED ORDER — ALBUTEROL SULFATE HFA 108 (90 BASE) MCG/ACT IN AERS: INHALATION_SPRAY | RESPIRATORY_TRACT | 1 refills | Status: DC

## 2020-04-07 MED ORDER — MELOXICAM 15 MG PO TABS: 15.0000 mg | ORAL_TABLET | Freq: Every day | ORAL | 2 refills | Status: DC

## 2020-04-07 MED ORDER — CYCLOBENZAPRINE HCL 10 MG PO TABS: ORAL_TABLET | ORAL | 2 refills | Status: DC

## 2020-04-07 NOTE — Telephone Encounter (Signed)
Pt has been r/s for 04/21/2020

## 2020-04-07 NOTE — Telephone Encounter (Signed)
Unable to reach the patient for clarification last Rx was send in 01/2020--90 days with one refill for Flexeril and meloxicam for 30 days with one refill and albuterol was in 2020 --- called pharmacy the had no record showing for any Rx since 10/2019.

## 2020-04-07 NOTE — Telephone Encounter (Signed)
This is duplicate request- pended at office for reveiw

## 2020-04-09 ENCOUNTER — Encounter: Payer: Self-pay | Admitting: Obstetrics and Gynecology

## 2020-04-13 ENCOUNTER — Ambulatory Visit: Payer: Self-pay | Admitting: General Practice

## 2020-04-13 ENCOUNTER — Telehealth: Payer: Medicaid Other | Admitting: General Practice

## 2020-04-13 DIAGNOSIS — F419 Anxiety disorder, unspecified: Secondary | ICD-10-CM

## 2020-04-13 DIAGNOSIS — F314 Bipolar disorder, current episode depressed, severe, without psychotic features: Secondary | ICD-10-CM

## 2020-04-13 NOTE — Patient Instructions (Signed)
Visit Information  Goals Addressed              This Visit's Progress   .  RNCM: pt-"Most days I have to stay in bed due to the chronic pain I have" (pt-stated)        CARE PLAN ENTRY (see longtitudinal plan of care for additional care plan information)  Current Barriers:  . Chronic Disease Management support, education, and care coordination needs related to HLD, Anxiety, Depression, Bipolar Disorder, and Chronic pain . Financial constraints . ADL/IADL limitations  Clinical Goal(s) related to HLD, Anxiety, Depression, Bipolar Disorder, and chronic pain :  Over the next 120 days, patient will:  . Work with the care management team to address educational, disease management, and care coordination needs  . Begin or continue self health monitoring activities as directed today  adhere to a heart healthy diet and utilize coping mechanisms for depression, anxiety, and bipolar episodes  . Call provider office for new or worsened signs and symptoms Shortness of breath and New or worsened symptom related to swelling in legs, anxiety, depression and biploar  . Call care management team with questions or concerns . Verbalize basic understanding of patient centered plan of care established today  Interventions related to HLD, Anxiety, Depression, Bipolar Disorder, and Chronic pain  :  . Evaluation of current treatment plans and patient's adherence to plan as established by provider.  The patient is wanting to make positive changes in her life that will help her improve her health and well being. The patient is working with the [facility] and CCM team to meet her health and well being goals. Making positive changes.  Since last call the patient has lost about 14 pounds by altering dietary intake. 02-10-2020: The patient continues to make progress in her health and well being. She say OB/GYN last week and will see the dentist this week to have 2 cavities fixed.  Her appointments are on Wednesday and Thursday.  The patient will then be ready to have her orthopedic appointment. 04-13-2020: The patient called today but it was a brief call. The patient was tearful and upset. Her and her fiance had just returned from [place] from the service of a friend of hers that committed suicide. The friend had bipolar also and she reflected briefly on how this has affected her but how she felt better since going to the service. The patient denies suicidal ideation and just wanted to lay down and get some rest. Will plan to reach out to the patient over the next couple of weeks. Emotional support given. The patient knows she can call or text the [facility] for support. Will continue to monitor for changes.  . Assessed patient understanding of disease states.  The patient has a good understanding of her chronic conditions. She sees pcp and psychiatrist.  Willing to work with the CCM team to help with the chronic conditions she has. . Assessed patient's education and care coordination needs.  The patient is unable to work due to her health. She needs help with filing for disability and charity care so she can be seen by orthopedic provider.  The patient also needs dental work. Care guide referral for assistance in applying for disability, charity care and dental resources in [place]. The patient has an appointment on 01-02-2020 to get her ID card updated. This should be the last document she needs to complete the charity care application.  The patient is calling the CCM team care guides back  today to help with disability paperwork and other needs. The patient wants to do what she can to improve her health and well being. Praised for accomplishments. 02-10-2020: The patient has her ID card and is completing paperwork and ready for submission this week. The patient is doing good and has made significant progress in her health and wellness goals. She has been able to get out and visit with her grandmother and her father and this was very  beneficial for her. The patient states that she wants to be able to feel better so she can help herself and others. Encouraged the patient to keep striving for her goals.  . Provided disease specific education to patient.  Education on the benefits of Heart healthy diet.  The patient is ready to make dietary changes to help with weight reduction, decrease swelling in her legs, and manage her HLD more effectively.  Will send education material through the My chart system and EMMI for heart healthy diet. The patient has cut out red meats and has lost 14 pounds since the last outreach with the [facility]. Praised for accomplishments.  . Assessed mammogram appointment. The patient had a "BSAP" appointment last week but had to cancel due to a family friend passing away.  The patient has a new appointment scheduled for June 1st.  The patient is wanting to get back on track with her health and is doing great things toward improving her health and well being.  . Assessed the patients pain level today. She rates her pain at "miserable"  The patient says the pain is unmanageable at times and she has to stay in bed all  day. She is very limited in her mobility.  The patient is open to ideas and suggestions to help with pain control.  Working with the CCM team care guides to get approved for charity care and disability. 02-10-2020: The patient is still having pain but it varies. She is working on getting an appointment with orthopedic doctor so her pain issues can be addressed. She knows if she can be more mobile this will help her with other areas of her health. She is optimistic about her future and improved health and wellness.  [name] with appropriate clinical care team members regarding patient needs . Referral placed for pharmacist: [facility] and any recommendations for improved help and well being/pain control . Referral for LCSW: The patient has a long standing history of anxiety, depression, and bipolar  disorder. The patient also needs assistance and guidance on charity care. The patient has a psychiatrist but welcomes any support and help in dealing with her conditions. She has a significant other, "[name]", that is very active in her care and a great support to the patient. 04-13-2020: The patient has had a lot of loss and recently dealt with the loss of a friend to suicide. Would benefit from LCSW support.  . Assessed and reviewed medications with the patient . The patient is compliant with current medication regimen. Updated list today.  . Encouraged the patient to reach out to the care guide team to get help with paperwork.  The patient is going to call today.  Also the [facility] will follow up on referral for LCSW.  The patient is thankful for the CCM support and help to get what she needs to take better care of herself. The patient states she is motivated to do what she can to help herself and improve her health and well being.   Patient  Self Care Activities related to HLD, Anxiety, Depression, Bipolar Disorder, and Chronic pain :  . Patient is unable to independently self-manage chronic health conditions  Please see past updates related to this goal by clicking on the "Past Updates" button in the selected goal         Patient verbalizes understanding of instructions provided today.   Telephone follow up appointment with care management team member scheduled for: 05-14-2020 at 11:45 am  Noreene Larsson RN, MSN, [place] Mobile: [phone]

## 2020-04-13 NOTE — Chronic Care Management (AMB) (Signed)
Care Management   Follow Up Note   04/13/2020 Name: Maria Jacobson MRN: [id number] DOB: [birth year]  Referred by: Maria Hauser, DO Reason for referral : Care Coordination (RNCM Follow up call for Chronic Disease Management and Care Coordination Needs)   Maria Jacobson is a 44 y.o. year old female who is a primary care patient of Maria Hauser, DO. The care management team was consulted for assistance with care management and care coordination needs.    Review of patient status, including review of consultants reports, relevant laboratory and other test results, and collaboration with appropriate care team members and the patient's provider was performed as part of comprehensive patient evaluation and provision of chronic care management services.    SDOH (Social Determinants of Health) assessments performed: Yes See Care Plan activities for detailed interventions related to [facility])     Advanced Directives: See Care Plan and Vynca application for related entries.   Goals Addressed              This Visit's Progress     RNCM: pt-"Most days I have to stay in bed due to the chronic pain I have" (pt-stated)        CARE PLAN ENTRY (see longtitudinal plan of care for additional care plan information)  Current Barriers:   Chronic Disease Management support, education, and care coordination needs related to HLD, Anxiety, Depression, Bipolar Disorder, and Chronic pain  Financial constraints  ADL/IADL limitations  Clinical Goal(s) related to HLD, Anxiety, Depression, Bipolar Disorder, and chronic pain :  Over the next 120 days, patient will:   Work with the care management team to address educational, disease management, and care coordination needs   Begin or continue self health monitoring activities as directed today  adhere to a heart healthy diet and utilize coping mechanisms for depression, anxiety, and bipolar episodes   Call provider office for new or  worsened signs and symptoms Shortness of breath and New or worsened symptom related to swelling in legs, anxiety, depression and biploar   Call care management team with questions or concerns  Verbalize basic understanding of patient centered plan of care established today  Interventions related to HLD, Anxiety, Depression, Bipolar Disorder, and Chronic pain  :   Evaluation of current treatment plans and patient's adherence to plan as established by provider.  The patient is wanting to make positive changes in her life that will help her improve her health and well being. The patient is working with the [facility] and CCM team to meet her health and well being goals. Making positive changes.  Since last call the patient has lost about 14 pounds by altering dietary intake. 02-10-2020: The patient continues to make progress in her health and well being. She say OB/GYN last week and will see the dentist this week to have 2 cavities fixed.  Her appointments are on Wednesday and Thursday. The patient will then be ready to have her orthopedic appointment. 04-13-2020: The patient called today but it was a brief call. The patient was tearful and upset. Her and her fiance had just returned from [place] from the service of a friend of hers that committed suicide. The friend had bipolar also and she reflected briefly on how this has affected her but how she felt better since going to the service. The patient denies suicidal ideation and just wanted to lay down and get some rest. Will plan to reach out to the patient over the next couple of weeks. Emotional support  given. The patient knows she can call or text the [facility] for support. Will continue to monitor for changes.   Assessed patient understanding of disease states.  The patient has a good understanding of her chronic conditions. She sees pcp and psychiatrist.  Willing to work with the CCM team to help with the chronic conditions she has.  Assessed patient's education  and care coordination needs.  The patient is unable to work due to her health. She needs help with filing for disability and charity care so she can be seen by orthopedic provider.  The patient also needs dental work. Care guide referral for assistance in applying for disability, charity care and dental resources in [place]. The patient has an appointment on 01-02-2020 to get her ID card updated. This should be the last document she needs to complete the charity care application.  The patient is calling the CCM team care guides back today to help with disability paperwork and other needs. The patient wants to do what she can to improve her health and well being. Praised for accomplishments. 02-10-2020: The patient has her ID card and is completing paperwork and ready for submission this week. The patient is doing good and has made significant progress in her health and wellness goals. She has been able to get out and visit with her grandmother and her father and this was very beneficial for her. The patient states that she wants to be able to feel better so she can help herself and others. Encouraged the patient to keep striving for her goals.   Provided disease specific education to patient.  Education on the benefits of Heart healthy diet.  The patient is ready to make dietary changes to help with weight reduction, decrease swelling in her legs, and manage her HLD more effectively.  Will send education material through the My chart system and EMMI for heart healthy diet. The patient has cut out red meats and has lost 14 pounds since the last outreach with the [facility]. Praised for accomplishments.   Assessed mammogram appointment. The patient had a "BSAP" appointment last week but had to cancel due to a family friend passing away.  The patient has a new appointment scheduled for June 1st.  The patient is wanting to get back on track with her health and is doing great things toward improving her health and  well being.   Assessed the patients pain level today. She rates her pain at "miserable"  The patient says the pain is unmanageable at times and she has to stay in bed all  day. She is very limited in her mobility.  The patient is open to ideas and suggestions to help with pain control.  Working with the CCM team care guides to get approved for charity care and disability. 02-10-2020: The patient is still having pain but it varies. She is working on getting an appointment with orthopedic doctor so her pain issues can be addressed. She knows if she can be more mobile this will help her with other areas of her health. She is optimistic about her future and improved health and wellness.   Collaborated with appropriate clinical care team members regarding patient needs  Referral placed for pharmacist: [facility] and any recommendations for improved help and well being/pain control  Referral for LCSW: The patient has a long standing history of anxiety, depression, and bipolar disorder. The patient also needs assistance and guidance on charity care. The patient has a psychiatrist but welcomes  any support and help in dealing with her conditions. She has a significant other, "[name]", that is very active in her care and a great support to the patient. 04-13-2020: The patient has had a lot of loss and recently dealt with the loss of a friend to suicide. Would benefit from LCSW support.   Assessed and reviewed medications with the patient . The patient is compliant with current medication regimen. Updated list today.   Encouraged the patient to reach out to the care guide team to get help with paperwork.  The patient is going to call today.  Also the [facility] will follow up on referral for LCSW.  The patient is thankful for the CCM support and help to get what she needs to take better care of herself. The patient states she is motivated to do what she can to help herself and improve her health and well being.    Patient Self Care Activities related to HLD, Anxiety, Depression, Bipolar Disorder, and Chronic pain :   Patient is unable to independently self-manage chronic health conditions  Please see past updates related to this goal by clicking on the "Past Updates" button in the selected goal          Telephone follow up appointment with care management team member scheduled for: 05-14-2020 at 11:45 am   [place], MSN, [place] Mobile: [phone]

## 2020-04-21 ENCOUNTER — Ambulatory Visit: Payer: Medicaid Other | Admitting: Licensed Clinical Social Worker

## 2020-04-21 NOTE — Chronic Care Management (AMB) (Signed)
Care Management   Follow Up Note   04/21/2020 Name: Maria Jacobson MRN: [id number] DOB: 1976-04-17  Referred by: [name], DO Reason for referral : Care Coordination   Maria Jacobson is a 44 y.o. year old female who is a primary care patient of [name], DO. The care management team was consulted for assistance with care management and care coordination needs.    Review of patient status, including review of consultants reports, relevant laboratory and other test results, and collaboration with appropriate care team members and the patient's provider was performed as part of comprehensive patient evaluation and provision of chronic care management services.    SDOH (Social Determinants of Health) assessments performed: Yes See Care Plan activities for detailed interventions related to [facility])     Advanced Directives: See Care Plan and Vynca application for related entries.   Goals Addressed      SW: "I lost a close friend recently." (pt-stated)        Current Barriers:   Chronic Mental Health needs related to grief, anxiety, bipolar and depression  Mental Health Concerns   Suicidal Ideation/Homicidal Ideation: No  Clinical Social Work Goal(s):    Over the next 120 days, patient will work with SW to address concerns related to care coordination needs and lack of Brewing technologist. LCSW will assist patient in gaining additional support in order to maintain health and mental health appropriately   Over the next 120 days, patient will work with SW bi-monthly by telephone or in person to reduce or manage symptoms related to stress and grief  Over the next 120 days, patient will demonstrate improved health management independence as evidenced by implementing healthy self-care skills and positive support/resources into her daily routine to help cope with stressors and improve overall health and well-being   Over the next 120 days,  patient or caregiver will verbalize basic understanding of depression/stress process and self health management plan as evidenced by her participation in development of long term plan of care and institution of self health management strategies  Interventions:  Patient interviewed and appropriate assessments performed: brief mental health assessment. Patient experienced a recent loss (close friend committed suicide)   Provided patient with information about coping skills and ways to effectively cope with grief  LCSW sent secure email to patient with mental health and grief support resources  Discussed plans with patient for ongoing care management follow up and provided patient with direct contact information for care management team  Advised patient to consider grief therapy and increasing her current socialization   Assisted patient/caregiver with obtaining information about health plan benefits  Provided education and assistance to client regarding Advanced Directives.  Provided education to patient/caregiver about Hospice and/or Palliative Care services  Referred patient to grief counseling for follow up. Patient reports that she will consider this resource.   Patient reports that "things are much better now." She shares that she has a great group of close friends that also experienced the loss of her friend and she has been able to implement their support into her daily routine to help combat her grief. She shares that they will be getting together next month as well.   Patient reports having all of her medications at this time and was given her Abilify shot at [facility] (in [place]) recently.   Patient has a long standing history of anxiety, depression, and bipolar disorder.. The patient has a psychiatrist but welcomes any support and help in dealing with her  conditions. She has a significant other, "[name]", that is very active in her care and a great support to the patient.   Grief  Counseling provided throughout entire session.   Patient Self Care Activities:   Self administers medications as prescribed  Attends all scheduled provider appointments  Attends church or other social activities  Calls provider office for new concerns or questions  Ability for insight  Independent living  Motivation for treatment  Strong family or social support  Patient Coping Strengths:   [place]  Spirituality  Hopefulness  Self Advocate  Able to Communicate Effectively  Initial goal documentation      The care management team will reach out to the patient again over the next 60 days.   Eula Fried, BSW, MSW, [email or web address] Phone: [phone]

## 2020-05-01 ENCOUNTER — Ambulatory Visit (INDEPENDENT_AMBULATORY_CARE_PROVIDER_SITE_OTHER): Payer: Self-pay | Admitting: Family Medicine

## 2020-05-01 ENCOUNTER — Other Ambulatory Visit: Payer: Self-pay

## 2020-05-01 ENCOUNTER — Encounter: Payer: Self-pay | Admitting: Family Medicine

## 2020-05-01 VITALS — BP 136/79 | HR 98 | Temp 98.1°F | Ht 69.0 in | Wt 342.5 lb

## 2020-05-01 DIAGNOSIS — R8290 Unspecified abnormal findings in urine: Secondary | ICD-10-CM

## 2020-05-01 DIAGNOSIS — R5383 Other fatigue: Secondary | ICD-10-CM | POA: Insufficient documentation

## 2020-05-01 LAB — POCT URINALYSIS DIPSTICK
Bilirubin, UA: NEGATIVE
Blood, UA: NEGATIVE
Glucose, UA: NEGATIVE
Ketones, UA: NEGATIVE
Nitrite, UA: POSITIVE
Protein, UA: NEGATIVE
Spec Grav, UA: <=1.005 — AB (ref 1.010–1.025)
Urobilinogen, UA: 0.2 E.U./dL
pH, UA: 5 (ref 5.0–8.0)

## 2020-05-01 MED ORDER — SULFAMETHOXAZOLE-TRIMETHOPRIM 800-160 MG PO TABS: 1.0000 | ORAL_TABLET | Freq: Two times a day (BID) | ORAL | 0 refills | Status: AC

## 2020-05-01 NOTE — Patient Instructions (Signed)
We will send your urine to the lab for processing and will send a referral to urology if this urine comes back with a third urinary tract infection for this year.  I have sent in a prescription for Bactrim DS to take 1 tablet twice daily x 5 days.  We will plan to see you back if your symptoms worsen or fail to improve  You will receive a survey after today's visit either digitally by e-mail or paper by USPS mail. Your experiences and feedback matter to [place].  Please respond so we know how we are doing as we provide care for you.  Call us with any questions/concerns/needs.  It is my goal to be available to you for your health concerns.  Thanks for choosing me to be a partner in your healthcare needs!  Harlin Rain, FNP-C Family Nurse Practitioner [facility] Phone: [phone]

## 2020-05-01 NOTE — Progress Notes (Signed)
Subjective:    Patient ID: Maria Jacobson, female    DOB: 1975-10-19, 44 y.o.   MRN: [id number]  Maria Jacobson is a 44 y.o. female presenting on 05/01/2020 for Urinary odor (fatigue x 2 mths. Pt concern of the frequency of urinary infection. She state she had total of three this year. )   HPI  Ms. Fahrney presents to clinic for concerns of urinary odor with urinary frequency.  Has concerns she has a urinary tract infection, that she has had two UTIs this year so far.  Has some concern for fatigue that has been present x 2 months and believes this may be related to her urinary symptoms.  Denies dysuria, urinary urgency, hesitancy, feeling of incomplete emptying or hematuria.  Depression screen [facility] 2/9 11/14/2019 02/08/2019 01/30/2019  Decreased Interest 2 0 1  Down, Depressed, Hopeless 3 0 2  PHQ - 2 Score 5 0 3  Altered sleeping 1 0 3  Tired, decreased energy 3 0 3  Change in appetite 1 0 -  Feeling bad or failure about yourself  3 0 1  Trouble concentrating 3 0 3  Moving slowly or fidgety/restless 0 0 0  Suicidal thoughts 0 0 1  PHQ-9 Score 16 0 14  Difficult doing work/chores Extremely dIfficult Not difficult at all Not difficult at all    Social History   Tobacco Use  . Smoking status: Never Smoker  . Smokeless tobacco: Never Used  Vaping Use  . Vaping Use: Never used  Substance Use Topics  . Alcohol use: Not Currently    Comment: rarely  . Drug use: No    Review of Systems  Constitutional: Negative.   HENT: Negative.   Eyes: Negative.   Respiratory: Negative.   Cardiovascular: Negative.   Gastrointestinal: Negative.   Endocrine: Negative.   Genitourinary: Positive for frequency. Negative for decreased urine volume, difficulty urinating, dyspareunia, dysuria, enuresis, flank pain, genital sores, hematuria, menstrual problem, pelvic pain, urgency, vaginal bleeding, vaginal discharge and vaginal pain.       Urinary odor  Musculoskeletal: Negative.   Skin: Negative.    Allergic/Immunologic: Negative.   Neurological: Negative.   Hematological: Negative.   Psychiatric/Behavioral: Negative.    Per HPI unless specifically indicated above     Objective:    BP 136/79 (BP Location: Left Arm, Patient Position: Sitting, Cuff Size: Large)   Pulse 98   Temp 98.1 F (36.7 C) (Oral)   Ht 5' 9"  (1.753 m)   Wt (!) 342 lb 8 oz (155.4 kg)   BMI 50.58 kg/m   Wt Readings from Last 3 Encounters:  05/01/20 (!) 342 lb 8 oz (155.4 kg)  02/06/20 (!) 350 lb 6.4 oz (158.9 kg)  01/21/20 (!) 358 lb 12.8 oz (162.8 kg)    Physical Exam Vitals and nursing note reviewed.  Constitutional:      General: She is not in acute distress.    Appearance: Normal appearance. She is well-developed and well-groomed. She is morbidly obese. She is not ill-appearing or toxic-appearing.  HENT:     Head: Normocephalic and atraumatic.     Nose:     Comments: Lizbeth Bark is in place, covering mouth and nose. Eyes:     General: Lids are normal. Vision grossly intact.        Right eye: No discharge.        Left eye: No discharge.     Extraocular Movements: Extraocular movements intact.     Conjunctiva/sclera: Conjunctivae normal.  Pupils: Pupils are equal, round, and reactive to light.  Cardiovascular:     Rate and Rhythm: Normal rate and regular rhythm.     Pulses: Normal pulses.     Heart sounds: Normal heart sounds. No murmur heard.  No friction rub. No gallop.   Pulmonary:     Effort: Pulmonary effort is normal. No respiratory distress.     Breath sounds: Normal breath sounds.  Musculoskeletal:     Right lower leg: No edema.     Left lower leg: No edema.  Skin:    General: Skin is warm and dry.     Capillary Refill: Capillary refill takes less than 2 seconds.  Neurological:     General: No focal deficit present.     Mental Status: She is alert and oriented to person, place, and time.  Psychiatric:        Attention and Perception: Attention and perception normal.         Mood and Affect: Mood and affect normal.        Speech: Speech normal.        Behavior: Behavior normal. Behavior is cooperative.        Thought Content: Thought content normal.        Cognition and Memory: Cognition and memory normal.        Judgment: Judgment normal.    Results for orders placed or performed in visit on 05/01/20  POCT Urinalysis Dipstick  Result Value Ref Range   Color, UA yellow    Clarity, UA clear    Glucose, UA Negative Negative   Bilirubin, UA negative    Ketones, UA negative    Spec Grav, UA <=1.005 (A) 1.010 - 1.025   Blood, UA negative    pH, UA 5.0 5.0 - 8.0   Protein, UA Negative Negative   Urobilinogen, UA 0.2 0.2 or 1.0 E.U./dL   Nitrite, UA positive    Leukocytes, UA Large (3+) (A) Negative   Appearance     Odor        Assessment & Plan:   Problem List Items Addressed This Visit      Other   Abnormal urine odor - Primary    Abnormal urine odor x 2 months, has a history of 2 UTIs this year, will send this urine to the lab for culture and discussed if positive will place referral to urology for evaluation.  Last two urine cultures show no resistance to Macrobid or Bactrim, will send in Bactrim to pharmacy on file.  Plan: 1. Begin bactrim DS 1 tablet 2x per day for the next 5 days 2. Urine sent to the lab for culture 3. Based on urine culture results, if positive, will refer to Urology 4. RTC if symptoms worsen or fail to improve      Relevant Medications   sulfamethoxazole-trimethoprim (BACTRIM DS) 800-160 MG tablet   Other Relevant Orders   POCT Urinalysis Dipstick (Completed)   Urine Culture   Fatigue    Discussed concerns for fatigue, offered lab work and evaluation for OSA.  Patient requesting to defer any additional work up until urine culture has resolved, believes this may be contributing to her fatigue.  Will defer at this time.         Meds ordered this encounter  Medications  . sulfamethoxazole-trimethoprim (BACTRIM DS)  800-160 MG tablet    Sig: Take 1 tablet by mouth 2 (two) times daily for 5 days.    Dispense:  10 tablet  Refill:  0    Follow up plan: Return if symptoms worsen or fail to improve.   Harlin Rain, [facility] Nurse Practitioner [facility] 05/01/2020, 9:51 AM

## 2020-05-01 NOTE — Assessment & Plan Note (Signed)
Abnormal urine odor x 2 months, has a history of 2 UTIs this year, will send this urine to the lab for culture and discussed if positive will place referral to urology for evaluation.  Last two urine cultures show no resistance to Macrobid or Bactrim, will send in Bactrim to pharmacy on file.  Plan: 1. Begin bactrim DS 1 tablet 2x per day for the next 5 days 2. Urine sent to the lab for culture 3. Based on urine culture results, if positive, will refer to Urology 4. RTC if symptoms worsen or fail to improve

## 2020-05-01 NOTE — Assessment & Plan Note (Signed)
Discussed concerns for fatigue, offered lab work and evaluation for OSA.  Patient requesting to defer any additional work up until urine culture has resolved, believes this may be contributing to her fatigue.  Will defer at this time.

## 2020-05-03 LAB — URINE CULTURE
MICRO NUMBER:: 11021402
SPECIMEN QUALITY:: ADEQUATE

## 2020-05-04 ENCOUNTER — Other Ambulatory Visit: Payer: Self-pay | Admitting: Family Medicine

## 2020-05-04 DIAGNOSIS — N390 Urinary tract infection, site not specified: Secondary | ICD-10-CM

## 2020-05-14 ENCOUNTER — Telehealth: Payer: Medicaid Other

## 2020-05-14 ENCOUNTER — Telehealth: Payer: Self-pay | Admitting: General Practice

## 2020-05-14 NOTE — Telephone Encounter (Signed)
  Chronic Care Management   Outreach Note  05/14/2020 Name: Maria Jacobson MRN: [id number] DOB: 09-30-1975  Referred by: [name], DO Reason for referral : Care Coordination (RNCM follow up call for Chronic Disease Management and Care Coordination Needs)   An unsuccessful telephone outreach was attempted today. The patient was referred to the case management team for assistance with care management and care coordination.   Follow Up Plan: A HIPAA compliant phone message was left for the patient providing contact information and requesting a return call.   Noreene Larsson RN, MSN, [place] Mobile: [phone]

## 2020-05-18 ENCOUNTER — Telehealth: Payer: Self-pay

## 2020-05-18 NOTE — Chronic Care Management (AMB) (Signed)
  Care Management   Note  05/18/2020 Name: Maria Jacobson MRN: [id number] DOB: 11-Jan-1976  Maria Jacobson is a 44 y.o. year old female who is a primary care patient of [name], DO and is actively engaged with the care management team. I reached out to [name] by phone today to assist with re-scheduling a follow up visit with the RN Case Manager  Follow up plan: Unsuccessful telephone outreach attempt made. A HIPAA compliant phone message was left for the patient providing contact information and requesting a return call.  The care management team will reach out to the patient again over the next 7 days.  If patient returns call to provider office, please advise to call [facility]  at [address], [place], [place], [place] [address] Direct Dial: [phone] [email or web address]

## 2020-05-21 NOTE — Telephone Encounter (Signed)
Pt has been r/s  

## 2020-05-21 NOTE — Chronic Care Management (AMB) (Signed)
  Care Management   Note  05/21/2020 Name: Maria Jacobson MRN: [id number] DOB: [birth year]  Maria Jacobson is a 44 y.o. year old female who is a primary care patient of [name], DO and is actively engaged with the care management team. I reached out to [name] by phone today to assist with re-scheduling a follow up visit with the RN Case Manager  Follow up plan: Telephone appointment with care management team member scheduled for:06/22/2020  Noreene Larsson, [place], [facility]  [place], [place] [address] Direct Dial: [phone] [email or web address]: [email or web address]

## 2020-05-26 ENCOUNTER — Encounter: Payer: Medicaid Other | Admitting: Obstetrics and Gynecology

## 2020-05-28 ENCOUNTER — Ambulatory Visit: Payer: Medicaid Other | Admitting: Licensed Clinical Social Worker

## 2020-05-28 NOTE — Chronic Care Management (AMB) (Signed)
Care Management   Follow Up Note   05/28/2020 Name: Maria Jacobson MRN: [id number] DOB: Jun 10, 1976  Referred by: [name], DO Reason for referral : Care Coordination   Maria Jacobson is a 44 y.o. year old female who is a primary care patient of [name], DO. The care management team was consulted for assistance with care management and care coordination needs.    Review of patient status, including review of consultants reports, relevant laboratory and other test results, and collaboration with appropriate care team members and the patient's provider was performed as part of comprehensive patient evaluation and provision of chronic care management services.    SDOH (Social Determinants of Health) assessments performed: Yes See Care Plan activities for detailed interventions related to [facility])     Advanced Directives: See Care Plan and Vynca application for related entries.   Goals Addressed    .  SW: "I lost a close friend recently." (pt-stated)        Current Barriers:  . Chronic Mental Health needs related to grief, anxiety, bipolar and depression . Mental Health Concerns  . Suicidal Ideation/Homicidal Ideation: No  Clinical Social Work Goal(s):   Marland Kitchen Over the next 120 days, patient will work with SW to address concerns related to care coordination needs and lack of Brewing technologist. LCSW will assist patient in gaining additional support in order to maintain health and mental health appropriately  . Over the next 120 days, patient will work with SW bi-monthly by telephone or in person to reduce or manage symptoms related to stress and grief . Over the next 120 days, patient will demonstrate improved health management independence as evidenced by implementing healthy self-care skills and positive support/resources into her daily routine to help cope with stressors and improve overall health and well-being  . Over the next 120 days,  patient or caregiver will verbalize basic understanding of depression/stress process and self health management plan as evidenced by her participation in development of long term plan of care and institution of self health management strategies  Interventions: . Patient interviewed and appropriate assessments performed: brief mental health assessment. Patient experienced a recent loss (close friend committed suicide)  . Provided patient with information about coping skills and ways to effectively cope with grief . LCSW sent secure email to patient with mental health and grief support resources . Discussed plans with patient for ongoing care management follow up and provided patient with direct contact information for care management team . Advised patient to consider grief therapy and increasing her current socialization  . Assisted patient/caregiver with obtaining information about health plan benefits . Provided education and assistance to client regarding Advanced Directives. . Provided education to patient/caregiver about Hospice and/or Palliative Care services . Referred patient to grief counseling for follow up. Patient reports that she will consider this resource.  . Patient reports that "things are much better now." She shares that she has a great group of close friends that also experienced the loss of her friend and she has been able to implement their support into her daily routine to help combat her grief. She shares that they will be getting together next month as well.  . Patient reports having all of her medications at this time and was given her Abilify shot at [facility]in [place] recently.  . Patient has a long standing history of anxiety, depression, and bipolar disorder.. The patient has a psychiatrist but welcomes any support and help in dealing with her  conditions. She has a significant other, "Maria Jacobson", that is very active in her care and a great support to the patient.  . Grief  Counseling provided throughout entire session.  . Patient reports that she had a recent dental procedure completed to remove her molars on 05/26/20. She reports that she is managing this pain well. . Patient shares that her fiance is her main support and that he will take her to all of her medical appointments and visit the doctor with her as she is unable to drive. Per patient has been unable to drive for 13 years due to anxiety.  . Patient is actively seeing a psychiatrist and denies needing therapy involvement at this time.   Patient Self Care Activities:  . Self administers medications as prescribed . Attends all scheduled provider appointments . Attends church or other social activities . Calls provider office for new concerns or questions . Ability for insight . Independent living . Motivation for treatment . Strong family or social support  Patient Coping Strengths:  . Supportive Relationships . Family . Church . [employer] . Spirituality . Hopefulness . Self Advocate . Able to Communicate Effectively  Please see past updates related to this goal by clicking on the "Past Updates" button in the selected goal       The care management team will reach out to the patient again over the next 90 days.   Maria Jacobson, BSW, MSW, [email or web address] Phone: [phone]

## 2020-06-01 ENCOUNTER — Ambulatory Visit (INDEPENDENT_AMBULATORY_CARE_PROVIDER_SITE_OTHER): Payer: Medicaid Other | Admitting: Obstetrics and Gynecology

## 2020-06-01 ENCOUNTER — Other Ambulatory Visit: Payer: Self-pay

## 2020-06-01 ENCOUNTER — Encounter: Payer: Self-pay | Admitting: Obstetrics and Gynecology

## 2020-06-01 VITALS — BP 138/83 | HR 94 | Ht 69.0 in | Wt 342.8 lb

## 2020-06-01 DIAGNOSIS — Z30431 Encounter for routine checking of intrauterine contraceptive device: Secondary | ICD-10-CM

## 2020-06-01 NOTE — Progress Notes (Signed)
HPI:      [name] is a 44 y.o. [phone] who LMP was No LMP recorded. (Menstrual status: IUD).  Subjective:   She presents today for IUD follow-up.  She missed her 1 month follow-up.  She states that she is not having menses on IUD and still likes the IUD but that her partner occasionally feels it and would like the strings shortened.    Hx: The following portions of the patient's history were reviewed and updated as appropriate:             She  has a past medical history of Anxiety, Bipolar 1 disorder ([place]), Bipolar affective disorder, depressed, severe ([place]), and Depression. She does not have any pertinent problems on file. She  has a past surgical history that includes Dilation and curettage of uterus and Breast biopsy (Right). Her family history includes Bipolar disorder in her father; Gout in her mother; Kidney disease in her mother; Melanoma in her paternal uncle; Rheum arthritis in her mother; Thyroid disease in her mother. She  reports that she has never smoked. She has never used smokeless tobacco. She reports previous alcohol use. She reports that she does not use drugs. She has a current medication list which includes the following prescription(s): abilify maintena, acetaminophen, albuterol, alprazolam, amphetamine-dextroamphetamine, clonazepam, cyclobenzaprine, fexofenadine, furosemide, lamotrigine, melatonin, meloxicam, omeprazole, and triamcinolone acetonide. She is allergic to drug ingredient [black walnut pollen allergy skin test], hazelnut (filbert) allergy skin test, pecan extract allergy skin test, and shellfish allergy.       Review of Systems:  Review of Systems  Constitutional: Denied constitutional symptoms, night sweats, recent illness, fatigue, fever, insomnia and weight loss.  Eyes: Denied eye symptoms, eye pain, photophobia, vision change and visual disturbance.  Ears/Nose/Throat/Neck: Denied ear, nose, throat or neck symptoms, hearing loss, nasal discharge,  sinus congestion and sore throat.  Cardiovascular: Denied cardiovascular symptoms, arrhythmia, chest pain/pressure, edema, exercise intolerance, orthopnea and palpitations.  Respiratory: Denied pulmonary symptoms, asthma, pleuritic pain, productive sputum, cough, dyspnea and wheezing.  Gastrointestinal: Denied, gastro-esophageal reflux, melena, nausea and vomiting.  Genitourinary: Denied genitourinary symptoms including symptomatic vaginal discharge, pelvic relaxation issues, and urinary complaints.  Musculoskeletal: Denied musculoskeletal symptoms, stiffness, swelling, muscle weakness and myalgia.  Dermatologic: Denied dermatology symptoms, rash and scar.  Neurologic: Denied neurology symptoms, dizziness, headache, neck pain and syncope.  Psychiatric: Denied psychiatric symptoms, anxiety and depression.  Endocrine: Denied endocrine symptoms including hot flashes and night sweats.   Meds:   Current Outpatient Medications on File Prior to Visit  Medication Sig Dispense Refill  . ABILIFY MAINTENA 400 MG PRSY INTRAMUSCULAR EVERY 4 WEEKS  3  . acetaminophen (TYLENOL) 500 MG tablet Take 1,000 mg by mouth every 4 (four) hours as needed for headache.    . albuterol (VENTOLIN HFA) 108 (90 Base) MCG/ACT inhaler INHALE 2 PUFFS INTO LUNGS EVERY 4 HOURS AS NEEDED FOR WHEEZING OR SHORTNESS OF BREATH (COUGH). 8.5 g 1  . ALPRAZolam (XANAX) 1 MG tablet Take 1 mg by mouth at bedtime as needed for anxiety.    Marland Kitchen amphetamine-dextroamphetamine (ADDERALL) 30 MG tablet Take 30 mg by mouth daily.    . clonazePAM (KLONOPIN) 0.5 MG tablet Take 0.5 mg by mouth 5 (five) times daily.    . cyclobenzaprine (FLEXERIL) 10 MG tablet TAKE ONE TABLET BY MOUTH THREE TIMES A DAY AS NEEDED FOR MUSCLE SPASMS 90 tablet 2  . fexofenadine (ALLEGRA) 180 MG tablet Take 180 mg by mouth daily as needed for allergies or rhinitis.    Marland Kitchen  furosemide (LASIX) 20 MG tablet TAKE 1 TAB BY MOUTH EVERY DAY FOR SWELLING, MAY INCREASE TO 2 TABS IF NEED  FOR MAX 7 DAYS PER FLARE 90 tablet 0  . lamoTRIgine (LAMICTAL) 100 MG tablet Take 200 mg by mouth 2 (two) times daily. Taking 200 mg (2 tablets) each morning and 150 mg (1.5 tablets) each evening    . melatonin 3 MG TABS tablet Take 3 mg by mouth at bedtime.    . meloxicam (MOBIC) 15 MG tablet Take 1 tablet (15 mg total) by mouth daily. 30 tablet 2  . omeprazole (PRILOSEC) 20 MG capsule Take 1 capsule (20 mg total) by mouth daily as needed. 30 capsule 2  . Triamcinolone Acetonide (NASACORT ALLERGY 24HR NA) Place into the nose daily as needed.     No current facility-administered medications on file prior to visit.       The pregnancy intention screening data noted above was reviewed. Potential methods of contraception were discussed. The patient elected to proceed with IUD or IUS.     Objective:     Vitals:   06/01/20 1113  BP: 138/83  Pulse: 94   Filed Weights   06/01/20 1113  Weight: (!) 342 lb 12.8 oz (155.5 kg)              Physical examination   Pelvic:   Vulva: Normal appearance.  No lesions.  Vagina: No lesions or abnormalities noted.  Support: Normal pelvic support.  Urethra No masses tenderness or scarring.  Meatus Normal size without lesions or prolapse.  Cervix: Normal appearance.  No lesions. IUD strings noted inside endocervical canal.  After identifying the strings 1 inch of string was removed ie shortened  Anus: Normal exam.  No lesions.  Perineum: Normal exam.  No lesions.        Bimanual   Uterus: Normal size.  Non-tender.  Mobile.  AV.  Adnexae: No masses.  Non-tender to palpation.  Cul-de-sac: Negative for abnormality.     Assessment:    [id number] Patient Active Problem List   Diagnosis Date Noted  . Abnormal urine 05/01/2020  . Abnormal urine odor 05/01/2020  . Fatigue 05/01/2020  . GERD (gastroesophageal reflux disease) 09/25/2019  . Epidermal cyst of neck 06/13/2017  . Atypical mole 10/05/2016  . Bilateral lower extremity edema 06/15/2016   . Pain and swelling of left lower leg 06/15/2016  . Pain in joint, multiple sites 06/15/2016  . Family history of rheumatoid arthritis 06/15/2016  . Chronic bilateral low back pain with right-sided sciatica 05/24/2016  . Left medial knee pain 05/23/2016  . Anxiety 05/23/2016  . Bipolar affective disorder, depressed, severe ([place]) 05/23/2016  . Morbid obesity with BMI of 50.0-59.9, adult ([place]) 05/23/2016  . Hyperlipidemia 05/23/2016     1. Surveillance of previously prescribed intrauterine contraceptive device     Patient doing well with IUD.   Plan:            1.  Patient will let us know if she continues to experience problems with IUD strings.  2.  Follow-up for annual examination and Pap smear. Orders No orders of the defined types were placed in this encounter.   No orders of the defined types were placed in this encounter.     F/U  No follow-ups on file. I spent 15 minutes involved in the care of this patient preparing to see the patient by obtaining and reviewing her medical history (including labs, imaging tests and prior procedures), documenting clinical information  in the electronic health record (EHR), counseling and coordinating care plans, writing and sending prescriptions, ordering tests or procedures and directly communicating with the patient by discussing pertinent items from her history and physical exam as well as detailing my assessment and plan as noted above so that she has an informed understanding.  All of her questions were answered.  Finis Bud, M.D. 06/01/2020 11:54 AM

## 2020-06-22 ENCOUNTER — Telehealth: Payer: Self-pay | Admitting: General Practice

## 2020-06-22 ENCOUNTER — Telehealth: Payer: Medicaid Other

## 2020-06-22 NOTE — Telephone Encounter (Signed)
  Chronic Care Management   Outreach Note  06/22/2020 Name: Maria Jacobson MRN: [id number] DOB: 1975-10-01  Referred by: [name], DO Reason for referral : Care Coordination (Follow up call: 2nd attempt for Chronic Disease Management and Care Coordination Needs)   A second unsuccessful telephone outreach was attempted today. The patient was referred to the case management team for assistance with care management and care coordination. Called this am and this afternoon and did not get the patient. This was the second outreach attempt to get the patient.   Follow Up Plan: A HIPAA compliant phone message was left for the patient providing contact information and requesting a return call.   Noreene Larsson RN, MSN, [place] Mobile: [phone]

## 2020-06-24 ENCOUNTER — Telehealth: Payer: Self-pay

## 2020-06-24 NOTE — Chronic Care Management (AMB) (Signed)
  Care Management   Note  06/24/2020 Name: Maria Jacobson MRN: [id number] DOB: 01-31-1976  Maria Jacobson is a 44 y.o. year old female who is a primary care patient of [name], DO and is actively engaged with the care management team. I reached out to [name] by phone today to assist with re-scheduling a follow up visit with the RN Case Manager  Follow up plan: Unsuccessful telephone outreach attempt made. A HIPAA compliant phone message was left for the patient providing contact information and requesting a return call.  The care management team will reach out to the patient again over the next 7 days.  If patient returns call to provider office, please advise to call [facility]  at [address], [address] [place], [place], [place] [address] Direct Dial: [phone] [email or web address]: [email or web address]

## 2020-07-02 NOTE — Chronic Care Management (AMB) (Signed)
  Care Management   Note  07/02/2020 Name: Maria Jacobson MRN: [id number] DOB: Mar 27, 1976  Maria Jacobson is a 44 y.o. year old female who is a primary care patient of [name], DO and is actively engaged with the care management team. I reached out to [name] by phone today to assist with re-scheduling a follow up visit with the RN Case Manager  Follow up plan: Unsuccessful telephone outreach attempt made. A HIPAA compliant phone message was left for the patient providing contact information and requesting a return call.  The care management team will reach out to the patient again over the next 7 days.  If patient returns call to provider office, please advise to call [place]  at [place], [place], [place], [place] [address] Direct Dial: [phone] [email or web address] Website: [email or web address]

## 2020-07-06 ENCOUNTER — Other Ambulatory Visit: Payer: Self-pay

## 2020-07-06 ENCOUNTER — Other Ambulatory Visit: Payer: Self-pay | Admitting: Family Medicine

## 2020-07-06 DIAGNOSIS — R600 Localized edema: Secondary | ICD-10-CM

## 2020-07-06 DIAGNOSIS — M25562 Pain in left knee: Secondary | ICD-10-CM

## 2020-07-06 DIAGNOSIS — G8929 Other chronic pain: Secondary | ICD-10-CM

## 2020-07-06 DIAGNOSIS — M5441 Lumbago with sciatica, right side: Secondary | ICD-10-CM

## 2020-07-06 NOTE — Telephone Encounter (Signed)
Requested Prescriptions  Pending Prescriptions Disp Refills   meloxicam (MOBIC) 15 MG tablet [Pharmacy Med Name: MELOXICAM 15 MG TABLET] 90 tablet 0    Sig: TAKE ONE TABLET BY MOUTH DAILY     Analgesics:  COX2 Inhibitors Passed - 07/06/2020  9:08 AM      Passed - HGB in normal range and within 360 days    Hemoglobin  Date Value Ref Range Status  10/28/2019 14.9 11.7 - 15.5 g/dL Final         Passed - Cr in normal range and within 360 days    Creat  Date Value Ref Range Status  10/28/2019 0.95 0.50 - 1.10 mg/dL Final         Passed - Patient is not pregnant      Passed - Valid encounter within last 12 months    Recent Outpatient Visits          2 months ago Abnormal urine odor   [facility], Lupita Raider, FNP   7 months ago Morbid obesity with BMI of 50.0-59.9, adult [facility])   [facility] [name], DO   9 months ago Foul smelling urine   [place], DO   1 year ago Acute bilateral low back pain without sciatica   [place], DO   1 year ago Left medial knee pain   [place], DO      Future Appointments            In 1 week [place], Devonne Doughty, DO [facility], [place]            cyclobenzaprine (FLEXERIL) 10 MG tablet [Pharmacy Med Name: CYCLOBENZAPRINE 10 MG TABLET] 90 tablet 2    Sig: TAKE ONE TABLET BY MOUTH THREE TIMES A DAY AS NEEDED FOR MUSCLE SPASMS     Not Delegated - Analgesics:  Muscle Relaxants Failed - 07/06/2020  9:08 AM      Failed - This refill cannot be delegated      Passed - Valid encounter within last 6 months    Recent Outpatient Visits          2 months ago Abnormal urine odor   [facility], Lupita Raider, FNP   7 months ago Morbid obesity with BMI of 50.0-59.9, adult [facility])   [facility] [name], DO   9  months ago Foul smelling urine   [place], DO   1 year ago Acute bilateral low back pain without sciatica   [place], DO   1 year ago Left medial knee pain   [facility] [place], Devonne Doughty, DO      Future Appointments            In 1 week [place], Devonne Doughty, DO [facility], [facility]

## 2020-07-06 NOTE — Telephone Encounter (Signed)
Requested medication (s) are due for refill today: yes  Requested medication (s) are on the active medication list:yes  Last refill: 04/07/20  #90  2 refills  Future visit scheduled:yes  Notes to clinic:  Not delegated    Requested Prescriptions  Pending Prescriptions Disp Refills   cyclobenzaprine (FLEXERIL) 10 MG tablet [Pharmacy Med Name: CYCLOBENZAPRINE 10 MG TABLET] 90 tablet 2    Sig: TAKE ONE TABLET BY MOUTH THREE TIMES A DAY AS NEEDED FOR MUSCLE SPASMS      Not Delegated - Analgesics:  Muscle Relaxants Failed - 07/06/2020  9:08 AM      Failed - This refill cannot be delegated      Passed - Valid encounter within last 6 months    Recent Outpatient Visits           2 months ago Abnormal urine odor   [facility], Lupita Raider, FNP   7 months ago Morbid obesity with BMI of 50.0-59.9, adult [facility])   [facility] [name], DO   9 months ago Foul smelling urine   [place], DO   1 year ago Acute bilateral low back pain without sciatica   [place], DO   1 year ago Left medial knee pain   [place], DO       Future Appointments             In 1 week [place], Devonne Doughty, DO [facility], [facility]             Signed Prescriptions Disp Refills   meloxicam (MOBIC) 15 MG tablet 90 tablet 0    Sig: TAKE ONE TABLET BY MOUTH DAILY      Analgesics:  COX2 Inhibitors Passed - 07/06/2020  9:08 AM      Passed - HGB in normal range and within 360 days    Hemoglobin  Date Value Ref Range Status  10/28/2019 14.9 11.7 - 15.5 g/dL Final          Passed - Cr in normal range and within 360 days    Creat  Date Value Ref Range Status  10/28/2019 0.95 0.50 - 1.10 mg/dL Final          Passed - Patient is not pregnant      Passed - Valid encounter within last 12 months    Recent  Outpatient Visits           2 months ago Abnormal urine odor   [facility], Lupita Raider, FNP   7 months ago Morbid obesity with BMI of 50.0-59.9, adult [facility])   [facility] [name], DO   9 months ago Foul smelling urine   [place], DO   1 year ago Acute bilateral low back pain without sciatica   [place], DO   1 year ago Left medial knee pain   [facility] [place] [facility], Devonne Doughty, DO       Future Appointments             In 1 week Parks Ranger, Devonne Doughty, DO [facility], [facility]

## 2020-07-06 NOTE — Telephone Encounter (Signed)
Requested Prescriptions  Pending Prescriptions Disp Refills  . furosemide (LASIX) 20 MG tablet [Pharmacy Med Name: FUROSEMIDE 20 MG TABLET] 90 tablet 0    Sig: TAKE ONE TABLET BY MOUTH DAILY FOR SWELLING. MAY INCREASE TO 2 TABLETS IF NEEDED FOR MAX 7 DAYS PER FLARE     Cardiovascular:  Diuretics - Loop Passed - 07/06/2020  4:24 PM      Passed - K in normal range and within 360 days    Potassium  Date Value Ref Range Status  10/28/2019 4.4 3.5 - 5.3 mmol/L Final         Passed - Ca in normal range and within 360 days    Calcium  Date Value Ref Range Status  10/28/2019 9.5 8.6 - 10.2 mg/dL Final         Passed - Na in normal range and within 360 days    Sodium  Date Value Ref Range Status  10/28/2019 137 135 - 146 mmol/L Final         Passed - Cr in normal range and within 360 days    Creat  Date Value Ref Range Status  10/28/2019 0.95 0.50 - 1.10 mg/dL Final         Passed - Last BP in normal range    BP Readings from Last 1 Encounters:  06/01/20 138/83         Passed - Valid encounter within last 6 months    Recent Outpatient Visits          2 months ago Abnormal urine odor   [place], Lupita Raider, FNP   7 months ago Morbid obesity with BMI of 50.0-59.9, adult [facility])   [facility] [name], DO   9 months ago Foul smelling urine   [place], DO   1 year ago Acute bilateral low back pain without sciatica   [place], DO   1 year ago Left medial knee pain   [facility] [place], Devonne Doughty, DO      Future Appointments            In 1 week [place], Devonne Doughty, DO [facility], [facility]

## 2020-07-09 MED ORDER — FUROSEMIDE 20 MG PO TABS: ORAL_TABLET | ORAL | 0 refills | Status: DC

## 2020-07-09 MED ORDER — MELOXICAM 15 MG PO TABS: 15.0000 mg | ORAL_TABLET | Freq: Every day | ORAL | 0 refills | Status: DC

## 2020-07-10 NOTE — Telephone Encounter (Signed)
3rd unsuccessful outreach

## 2020-07-10 NOTE — Chronic Care Management (AMB) (Signed)
  Care Management   Note  07/10/2020 Name: Maria Jacobson MRN: [id number] DOB: [birth year]  Maria Jacobson is a 44 y.o. year old female who is a primary care patient of [name], DO and is actively engaged with the care management team. I reached out to [name] by phone today to assist with re-scheduling a follow up visit with the RN Case Manager  Follow up plan: Unable to make contact on outreach attempts x 3. PCP Dr. Parks Ranger notified via routed documentation in medical record.   Noreene Larsson, [place], [place],  [address] Direct Dial: [phone] [email or web address] Website: [email or web address]

## 2020-07-13 ENCOUNTER — Other Ambulatory Visit: Payer: Self-pay

## 2020-07-13 ENCOUNTER — Encounter: Payer: Self-pay | Admitting: Family Medicine

## 2020-07-13 ENCOUNTER — Ambulatory Visit: Payer: Self-pay | Admitting: Family Medicine

## 2020-07-13 VITALS — BP 125/82 | HR 109 | Temp 98.2°F | Resp 16 | Ht 69.0 in | Wt 339.6 lb

## 2020-07-13 DIAGNOSIS — M5441 Lumbago with sciatica, right side: Secondary | ICD-10-CM

## 2020-07-13 DIAGNOSIS — Z6841 Body Mass Index (BMI) 40.0 and over, adult: Secondary | ICD-10-CM

## 2020-07-13 DIAGNOSIS — F314 Bipolar disorder, current episode depressed, severe, without psychotic features: Secondary | ICD-10-CM

## 2020-07-13 DIAGNOSIS — N390 Urinary tract infection, site not specified: Secondary | ICD-10-CM

## 2020-07-13 DIAGNOSIS — M25562 Pain in left knee: Secondary | ICD-10-CM

## 2020-07-13 DIAGNOSIS — M25561 Pain in right knee: Secondary | ICD-10-CM

## 2020-07-13 DIAGNOSIS — G8929 Other chronic pain: Secondary | ICD-10-CM

## 2020-07-13 DIAGNOSIS — F419 Anxiety disorder, unspecified: Secondary | ICD-10-CM

## 2020-07-13 MED ORDER — LIDOCAINE HCL (PF) 1 % IJ SOLN
4.0000 mL | Freq: Once | INTRAMUSCULAR | Status: AC
  Administered 2020-07-13: 4 mL

## 2020-07-13 MED ORDER — METHYLPREDNISOLONE ACETATE 40 MG/ML IJ SUSP
40.0000 mg | Freq: Once | INTRAMUSCULAR | Status: AC
  Administered 2020-07-13: 40 mg via INTRA_ARTICULAR

## 2020-07-13 NOTE — Progress Notes (Signed)
Subjective:    Patient ID: Maria Jacobson, female    DOB: 10-18-1975, 44 y.o.   MRN: [id number]  Maria Jacobson is a 44 y.o. female presenting on 07/13/2020 for Knee Pain   HPI    Chronic Knee Pain / Osteoarthritis - see prior notes Quality of life has decreaseddue to joint pain and weight gain, reduced mobility Bipolar flare worse at times in 2020, following with Psych She is working with Nurse Case Manager now for charity care applications - last few attempts were unsuccessful by phone and she has not completed this yet.  Chronic knee pain, Bilateral Left knee still bothers her chronic pain is moderate to severe, but now R is worse at times. Last visit 09/2019 received steroid injection in both knees with improvement. Now nearly 10 months lapsed since last injection  She has had several steroid injection in L knee in the past, last was >6-8 months ago, had done well but again only temporary.  She has been unable to afford Orthopedic specialist for few years, and further MRI and other intervention.  Already refilled Flexeril, Meloxicam, continues on medication  Bipolar Anxiety Mental Health Followed by Psychiatry on med management Not with therapist She continues on Abilify injection, and Adderall, Lamictal Clonazepam daily and then Alprazolam PRN. Admits lost 2 friends to suicide and another has passed recently  UTI, recurrent On [place], no UTI in past 3 months.   Depression screen [facility] 2/9 07/13/2020 11/14/2019 02/08/2019  Decreased Interest 1 2 0  Down, Depressed, Hopeless 1 3 0  PHQ - 2 Score 2 5 0  Altered sleeping 1 1 0  Tired, decreased energy 2 3 0  Change in appetite 1 1 0  Feeling bad or failure about yourself  3 3 0  Trouble concentrating 2 3 0  Moving slowly or fidgety/restless 1 0 0  Suicidal thoughts 0 0 0  PHQ-9 Score 12 16 0  Difficult doing work/chores Somewhat difficult Extremely dIfficult Not difficult at all   GAD 7 : Generalized Anxiety Score  07/13/2020  Nervous, Anxious, on Edge 2  Control/stop worrying 2  Worry too much - different things 3  Trouble relaxing 2  Restless 1  Easily annoyed or irritable 2  Afraid - awful might happen 3  Total GAD 7 Score 15  Anxiety Difficulty Somewhat difficult     Social History   Tobacco Use  . Smoking status: Never Smoker  . Smokeless tobacco: Never Used  Vaping Use  . Vaping Use: Never used  Substance Use Topics  . Alcohol use: Not Currently    Comment: rarely  . Drug use: No    Review of Systems Per HPI unless specifically indicated above     Objective:    BP 125/82   Pulse (!) 109   Temp 98.2 F (36.8 C) (Temporal)   Resp 16   Ht 5' 9"  (1.753 m)   Wt (!) 339 lb 9.6 oz (154 kg)   SpO2 97%   BMI 50.15 kg/m   Wt Readings from Last 3 Encounters:  07/13/20 (!) 339 lb 9.6 oz (154 kg)  06/01/20 (!) 342 lb 12.8 oz (155.5 kg)  05/01/20 (!) 342 lb 8 oz (155.4 kg)    Physical Exam Vitals and nursing note reviewed.  Constitutional:      General: She is not in acute distress.    Appearance: She is well-developed. She is obese. She is not diaphoretic.     Comments: Well-appearing, comfortable, cooperative  HENT:  Head: Normocephalic and atraumatic.  Eyes:     General:        Right eye: No discharge.        Left eye: No discharge.     Conjunctiva/sclera: Conjunctivae normal.     Pupils: Pupils are equal, round, and reactive to light.  Neck:     Thyroid: No thyromegaly.  Cardiovascular:     Rate and Rhythm: Normal rate and regular rhythm.     Heart sounds: Normal heart sounds. No murmur heard.   Pulmonary:     Effort: Pulmonary effort is normal. No respiratory distress.     Breath sounds: Normal breath sounds. No wheezing or rales.  Abdominal:     General: Bowel sounds are normal. There is no distension.     Palpations: Abdomen is soft. There is no mass.     Tenderness: There is no abdominal tenderness.  Musculoskeletal:        General: No tenderness.      Cervical back: Normal range of motion and neck supple.     Right lower leg: Edema (trace) present.     Left lower leg: Edema (trace) present.     Comments: Upper / Lower Extremities: - Normal muscle tone, strength bilateral upper extremities 5/5, lower extremities 5/5  Lymphadenopathy:     Cervical: No cervical adenopathy.  Skin:    General: Skin is warm and dry.     Findings: No erythema or rash.  Neurological:     Mental Status: She is alert and oriented to person, place, and time.     Comments: Distal sensation intact to light touch all extremities  Psychiatric:        Behavior: Behavior normal.     Comments: Well groomed, good eye contact, normal speech and thoughts        ________________________________________________________ PROCEDURE NOTE Date: 07/13/20 RIGHT Knee corticosteroid injection Discussed benefits and risks (including pain, bleeding, infection, steroid flare). Verbal consent given by patient. Medication:  1 cc Depo-medrol 60m and 4 cc Lidocaine 1% without epi Time Out taken  Landmarks identified. Area cleansed with alcohol wipes.Using 21 gauge and 1, 1/2 inch needle, Right knee joint space was injected (with above listed medication) via lateral approach cold spray used for superficial anesthetic.Sterile bandage placed.Patient tolerated procedure well without bleeding or paresthesias.No complications.   ________________________________________________________ PROCEDURE NOTE Date: 07/13/20 LEFT Knee corticosteroid injection Discussed benefits and risks (including pain, bleeding, infection, steroid flare). Verbal consent given by patient. Medication:  1 cc Depo-medrol 464mand 4 cc Lidocaine 1% without epi Time Out taken  Landmarks identified. Area cleansed with alcohol wipes.Using 21 gauge and 1, 1/2 inch needle, LEFT knee joint space was injected (with above listed medication) via lateral approach cold spray used for superficial  anesthetic.Sterile bandage placed.Patient tolerated procedure well without bleeding or paresthesias.No complications. _____________________________________  Results for orders placed or performed in visit on 05/01/20  Urine Culture   Specimen: Urine  Result Value Ref Range   MICRO NUMBER: [id number]  SPECIMEN QUALITY: Adequate    Sample Source NOT GIVEN    STATUS: FINAL    ISOLATE 1: Escherichia coli (A)       Susceptibility   Escherichia coli - URINE CULTURE, REFLEX    AMOX/CLAVULANIC 4 Sensitive     AMPICILLIN >=32 Resistant     AMPICILLIN/SULBACTAM 16 Intermediate     CEFAZOLIN* <=4 Not Reportable      * For infections other than uncomplicated UTIcaused by E. coli, K. pneumoniae  or P. mirabilis:Cefazolin is resistant if MIC > or = 8 mcg/mL.(Distinguishing susceptible versus intermediatefor isolates with MIC < or = 4 mcg/mL requiresadditional testing.)For uncomplicated UTI caused by E. coli,K. pneumoniae or P. mirabilis: Cefazolin issusceptible if MIC <32 mcg/mL and predictssusceptible to the oral agents cefaclor, cefdinir,cefpodoxime, cefprozil, cefuroxime, cephalexinand loracarbef.    CEFEPIME <=1 Sensitive     CEFTRIAXONE <=1 Sensitive     CIPROFLOXACIN <=0.25 Sensitive     LEVOFLOXACIN <=0.12 Sensitive     ERTAPENEM <=0.5 Sensitive     GENTAMICIN <=1 Sensitive     IMIPENEM <=0.25 Sensitive     NITROFURANTOIN <=16 Sensitive     PIP/TAZO <=4 Sensitive     TOBRAMYCIN <=1 Sensitive     TRIMETH/SULFA* <=20 Sensitive      * For infections other than uncomplicated UTIcaused by E. coli, K. pneumoniae or P. mirabilis:Cefazolin is resistant if MIC > or = 8 mcg/mL.(Distinguishing susceptible versus intermediatefor isolates with MIC < or = 4 mcg/mL requiresadditional testing.)For uncomplicated UTI caused by E. coli,K. pneumoniae or P. mirabilis: Cefazolin issusceptible if MIC <32 mcg/mL and predictssusceptible to the oral agents cefaclor, cefdinir,cefpodoxime, cefprozil, cefuroxime,  cephalexinand loracarbef.Legend:S = Susceptible  I = IntermediateR = Resistant  NS = Not susceptible* = Not tested  NR = Not reported**NN = See antimicrobic comments  POCT Urinalysis Dipstick  Result Value Ref Range   Color, UA yellow    Clarity, UA clear    Glucose, UA Negative Negative   Bilirubin, UA negative    Ketones, UA negative    Spec Grav, UA <=1.005 (A) 1.010 - 1.025   Blood, UA negative    pH, UA 5.0 5.0 - 8.0   Protein, UA Negative Negative   Urobilinogen, UA 0.2 0.2 or 1.0 E.U./dL   Nitrite, UA positive    Leukocytes, UA Large (3+) (A) Negative   Appearance     Odor        Assessment & Plan:   Problem List Items Addressed This Visit    Morbid obesity with BMI of 50.0-59.9, adult (HCC)   Left medial knee pain   Chronic bilateral low back pain with right-sided sciatica   Relevant Medications   methylPREDNISolone acetate (DEPO-MEDROL) injection 40 mg (Start on 07/13/2020  3:30 PM)   methylPREDNISolone acetate (DEPO-MEDROL) injection 40 mg (Start on 07/13/2020  3:30 PM)   Bipolar affective disorder, depressed, severe (HCC)   Anxiety    Other Visit Diagnoses    Chronic pain of both knees    -  Primary   Relevant Medications   lidocaine (PF) (XYLOCAINE) 1 % injection 4 mL (Start on 07/13/2020  3:30 PM)   methylPREDNISolone acetate (DEPO-MEDROL) injection 40 mg (Start on 07/13/2020  3:30 PM)   lidocaine (PF) (XYLOCAINE) 1 % injection 4 mL (Start on 07/13/2020  3:30 PM)   methylPREDNISolone acetate (DEPO-MEDROL) injection 40 mg (Start on 07/13/2020  3:30 PM)   Chronic urinary tract infection          Acute on chronic, bilateral knee pain 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 Concern with multiple steroid injections in L knee in past, defer to repeat now due to risks of repeated injection, concern w/ non adherence to previous treatment plan Unsuccessful referrals in past to Orthopedic, lack of insurance unable to afford specialist management but also unable to  complete financial assistance application  Bilateral knee steroid injection today Continue current meds, flexeril PRN  Will forward again to CCM team today to resume conversation with her,  I'm concerned that we have not made progress with any [facility] application in quite some time now. Advised patient we are nearly at limit of what we can provide here without specialty care.  Meds ordered this encounter  Medications  . lidocaine (PF) (XYLOCAINE) 1 % injection 4 mL  . methylPREDNISolone acetate (DEPO-MEDROL) injection 40 mg  . lidocaine (PF) (XYLOCAINE) 1 % injection 4 mL  . methylPREDNISolone acetate (DEPO-MEDROL) injection 40 mg      Follow up plan: Return in about 6 months (around 01/11/2021), or if symptoms worsen or fail to improve, for knee pain.  Forward chart to CCM Nurse CM / LCSW  [name], [facility] 07/13/2020, 3:09 PM

## 2020-07-13 NOTE — Patient Instructions (Addendum)
Thank you for coming to the office today.  You received bilateral Knee Joint steroid injection today. - Lidocaine numbing medicine may ease the pain initially for a few hours until it wears off - As discussed, you may experience a "steroid flare" this evening or within 24-48 hours, anytime medicine is injected into an inflamed joint it can cause the pain to get worse temporarily - Everyone responds differently to these injections, it depends on the patient and the severity of the joint problem, it may provide anywhere from days to weeks, to months of relief. Ideal response is >6 months relief - Try to take it easy for next 1-2 days, avoid over activity and strain on joint (limit walking for knee) - Recommend the following:   - For swelling - rest, compression sleeve / ACE wrap, elevation, and ice packs as needed for first few days   - For pain in future may use heating pad or moist heat as needed   Please schedule a Follow-up Appointment to: Return in about 6 months (around 01/11/2021), or if symptoms worsen or fail to improve, for knee pain.  If you have any other questions or concerns, please feel free to call the office or send a message through [place]. You may also schedule an earlier appointment if necessary.  Additionally, you may be receiving a survey about your experience at our office within a few days to 1 week by e-mail or mail. We value your feedback.  [name], DO [place]

## 2020-07-14 ENCOUNTER — Telehealth: Payer: Self-pay

## 2020-07-14 ENCOUNTER — Other Ambulatory Visit: Payer: Self-pay | Admitting: Family Medicine

## 2020-07-14 DIAGNOSIS — G8929 Other chronic pain: Secondary | ICD-10-CM

## 2020-07-14 NOTE — Chronic Care Management (AMB) (Signed)
  Care Management   Note  07/14/2020 Name: Maria Jacobson MRN: [id number] DOB: [birth year]  Maria Jacobson is a 44 y.o. year old female who is a primary care patient of [name], DO and is actively engaged with the care management team. I reached out to [name] by phone today to assist with re-scheduling a follow up visit with the RN Case Manager  Follow up plan: Unsuccessful telephone outreach attempt made. A HIPAA compliant phone message was left for the patient providing contact information and requesting a return call.  The care management team will reach out to the patient again over the next 7 days.  If patient returns call to provider office, please advise to call [place]  at [employer], [place], [place], [place] [address] Direct Dial: [phone] [email or web address]

## 2020-07-15 ENCOUNTER — Telehealth: Payer: Self-pay

## 2020-07-15 NOTE — Telephone Encounter (Signed)
    MA12/15/2021 1st Attempt  Name: Maria Jacobson   MRN: [id number]   DOB: 1976-03-11   AGE: 44 y.o.   GENDER: female   PCP [name], DO.   Referral Reason: Financial Difficulties related to medical bills  Interventions: Successful outbound call placed to the patient Patient provided with information about care guide support team and interviewed to confirm resource needs Emailed partially completed [facility] application to patient.  Follow up plan: Care guide will follow up with patient by phone over the next 5 days     Nithila Sumners, AAS Paralegal, [place] . Embedded Care Coordination [facility] Health  Care Management  [address], [place] [address] [email or web address]  [phone]   [email or web address]

## 2020-07-17 ENCOUNTER — Telehealth: Payer: Self-pay

## 2020-07-17 NOTE — Telephone Encounter (Signed)
    MA12/17/2021   Name: Maria Jacobson   MRN: [id number]   DOB: 09-Sep-1975   AGE: 44 y.o.   GENDER: female   PCP [name], DO.   Referral Reason: Financial Difficulties related to medical bills  Interventions: Successful outbound call placed to the patient Follow up call placed to the patient to discuss status of referral Patient is out of town and asked that I call her next week regarding resources emailed for [facility].  Follow up plan: Care guide will follow up with patient by phone over the next 3 days   Daylon Lafavor, AAS Paralegal, [place] . Embedded Care Coordination [facility] Health  Care Management  [address], [place] [address] [email or web address]  [phone]   [email or web address]

## 2020-07-21 ENCOUNTER — Telehealth: Payer: Self-pay

## 2020-07-21 NOTE — Telephone Encounter (Signed)
    MA12/21/2021   Name: Maria Jacobson   MRN: [id number]   DOB: 1976/01/18   AGE: 44 y.o.   GENDER: female   PCP [name], DO.   Referral Reason: Financial Difficulties related to medical bills  Interventions: Unsuccessful outbound call placed to the patient. A HIPAA compliant voice message was left requesting a return call. Follow up call placed to the patient to discuss status of referral  Follow up plan: Care guide will follow up with patient by phone over the next 7 days  Mariavictoria Nottingham, AAS Paralegal, [place] . Embedded Care Coordination [facility]  Care Management  [address], [place] [address] [email or web address]  [phone]   [email or web address]

## 2020-07-22 ENCOUNTER — Telehealth: Payer: Self-pay

## 2020-07-22 NOTE — Telephone Encounter (Signed)
    MA12/22/2021   Name: Maria Jacobson   MRN: [id number]   DOB: [birth year]   AGE: 44 y.o.   GENDER: female   PCP [name], DO.   Referral Reason: Financial Difficulties related to medical bills  Interventions: Successful outbound call placed to the patient Follow up call placed to the patient to discuss status of referral Patient is out of town will check her email once she returns next week  Follow up plan: Care guide will follow up with patient by phone over the next 7 days   Tenika Keeran, AAS Paralegal, [place] . Embedded Care Coordination [facility]  Care Management  [address], [place] [address] [email or web address]  [phone]   [email or web address]

## 2020-07-22 NOTE — Chronic Care Management (AMB) (Signed)
°  Care Management   Note  07/22/2020 Name: [name] MRN: [id number] DOB: 13-Dec-1975  Maria Jacobson is a 44 y.o. year old female who is a primary care patient of [name], DO and is actively engaged with the care management team. I reached out to [name] by phone today to assist with re-scheduling a follow up visit with the RN Case Manager  Follow up plan: Unable to make contact on outreach attempts x 5. PCP Dr. Parks Ranger notified via routed documentation in medical record.   Noreene Larsson, [place], [place], [place] [address] Direct Dial: [phone] [email or web address]: [email or web address]

## 2020-07-28 ENCOUNTER — Telehealth: Payer: Self-pay

## 2020-07-28 NOTE — Telephone Encounter (Signed)
    MA12/28/2021   Name: Maria Jacobson   MRN: [id number]   DOB: May 19, 1976   AGE: 44 y.o.   GENDER: female   PCP [name], DO.   Referral Reason: Financial Difficulties related to medical bills  Interventions: Unsuccessful outbound call placed to the patient. A HIPAA compliant voice message was left requesting a return call.  Follow up plan: Care guide will follow up with patient by phone over the next 7 days.   Morrigan Wickens, AAS Paralegal, [place] . Embedded Care Coordination [facility]  Care Management  [address], [place] [address] [email or web address]  [phone]   [email or web address]

## 2020-08-04 ENCOUNTER — Telehealth: Payer: Medicaid Other

## 2020-08-04 ENCOUNTER — Telehealth: Payer: Self-pay | Admitting: Licensed Clinical Social Worker

## 2020-08-04 NOTE — Telephone Encounter (Addendum)
  Chronic Care Management    Clinical Social Work General Follow Up Note  08/04/2020 Name: Maria Jacobson MRN: [id number] DOB: [birth year]  Maria Jacobson is a 45 y.o. year old female who is a primary care patient of [name], DO. The CCM team was consulted for assistance with Mental Health Counseling and Resources.   Review of patient status, including review of consultants reports, relevant laboratory and other test results, and collaboration with appropriate care team members and the patient's provider was performed as part of comprehensive patient evaluation and provision of chronic care management services.    LCSW completed CCM outreach attempt today but was unable to reach patient successfully. A HIPPA compliant voice message was left encouraging patient to return call once available if still interested in CCM involvement. CCM team has had difficulty maintaining contact with patient. CCM LCSW collaborated with [place] who has made six unsuccessful outreach attempts to patient. The care management team is available to follow up with the patient after provider conversation with the patient regarding recommendation for care management engagement and subsequent re-referral to the care management team.    Outpatient Encounter Medications as of 08/04/2020  Medication Sig Note  . ABILIFY MAINTENA 400 MG PRSY INTRAMUSCULAR EVERY 4 WEEKS   . acetaminophen (TYLENOL) 500 MG tablet Take 1,000 mg by mouth every 4 (four) hours as needed for headache.   . albuterol (VENTOLIN HFA) 108 (90 Base) MCG/ACT inhaler INHALE 2 PUFFS INTO LUNGS EVERY 4 HOURS AS NEEDED FOR WHEEZING OR SHORTNESS OF BREATH (COUGH).   Marland Kitchen ALPRAZolam (XANAX) 1 MG tablet Take 1 mg by mouth at bedtime as needed for anxiety.   Marland Kitchen amphetamine-dextroamphetamine (ADDERALL) 30 MG tablet Take 30 mg by mouth daily. 12/04/2019: QAM  . clonazePAM (KLONOPIN) 0.5 MG tablet Take 0.5 mg by mouth 5 (five) times daily.   .  cyclobenzaprine (FLEXERIL) 10 MG tablet TAKE ONE TABLET BY MOUTH THREE TIMES A DAY AS NEEDED FOR MUSCLE SPASMS   . fexofenadine (ALLEGRA) 180 MG tablet Take 180 mg by mouth daily as needed for allergies or rhinitis.   . furosemide (LASIX) 20 MG tablet TAKE 1 TAB BY MOUTH EVERY DAY FOR SWELLING, MAY INCREASE TO 2 TABS IF NEED FOR MAX 7 DAYS PER FLARE   . lamoTRIgine (LAMICTAL) 100 MG tablet Take 200 mg by mouth 2 (two) times daily. Taking 200 mg (2 tablets) each morning and 150 mg (1.5 tablets) each evening   . melatonin 3 MG TABS tablet Take 3 mg by mouth at bedtime.   . meloxicam (MOBIC) 15 MG tablet Take 1 tablet (15 mg total) by mouth daily.   Marland Kitchen omeprazole (PRILOSEC) 20 MG capsule Take 1 capsule (20 mg total) by mouth daily as needed.   . Triamcinolone Acetonide (NASACORT ALLERGY 24HR NA) Place into the nose daily as needed.    No facility-administered encounter medications on file as of 08/04/2020.    Follow Up Plan: LCSW has been unable to maintain contact with the patient for follow up. The care management team is available to follow up with the patient after provider conversation with the patient regarding recommendation for care management engagement and subsequent re-referral to the care management team.    Eula Fried, BSW, MSW, [email or web address] Phone: [phone]

## 2020-08-07 ENCOUNTER — Telehealth: Payer: Self-pay

## 2020-08-07 NOTE — Telephone Encounter (Signed)
    MA1/01/2021 2nd Attempt  Name: Maria Jacobson   MRN: [id number]   DOB: [birth year]   AGE: 45 y.o.   GENDER: female   PCP [name], DO.   Referral Reason: Financial Difficulties related to medical bills  Interventions: Unsuccessful outbound call placed to the patient. A HIPAA compliant voice message was left requesting a return call. Follow up call placed to the patient to discuss status of referral  Follow up plan: Care guide will follow up with patient by phone over the next 5 days     [name], AAS Paralegal, [name] . Embedded Care Coordination [facility]  Care Management  [address], [place] [address] [email or web address]  [phone]   [email or web address]

## 2020-08-11 ENCOUNTER — Telehealth: Payer: Self-pay

## 2020-08-11 NOTE — Telephone Encounter (Signed)
    MA1/06/2021 3rd Attempt  Name: Maria Jacobson   MRN: [id number]   DOB: [birth year]   AGE: 45 y.o.   GENDER: female   PCP [name], DO.   Referral Reason: Financial Difficulties related to medical bills  Interventions: Unsuccessful outbound call placed to the patient.  Follow up plan: Closing referral after 3 unsuccessful attempts to follow up with patient.   Byan Poplaski, AAS Paralegal, [place] . Embedded Care Coordination [facility]  Care Management  [address], [place] [address] [email or web address]  [phone]   [email or web address]

## 2020-09-29 ENCOUNTER — Other Ambulatory Visit: Payer: Self-pay | Admitting: Family Medicine

## 2020-09-29 DIAGNOSIS — M5441 Lumbago with sciatica, right side: Secondary | ICD-10-CM

## 2020-09-29 DIAGNOSIS — G8929 Other chronic pain: Secondary | ICD-10-CM

## 2020-09-29 NOTE — Telephone Encounter (Signed)
Requested medication (s) are due for refill today: yes  Requested medication (s) are on the active medication list: yes  Last refill:  07/06/20 #90 2 refills  Future visit scheduled: no  Notes to clinic:  not delegated per protocol     Requested Prescriptions  Pending Prescriptions Disp Refills   cyclobenzaprine (FLEXERIL) 10 MG tablet [Pharmacy Med Name: CYCLOBENZAPRINE 10 MG TABLET] 90 tablet 2    Sig: TAKE ONE TABLET BY MOUTH THREE TIMES A DAY AS NEEDED FOR MUSCLE SPASMS      Not Delegated - Analgesics:  Muscle Relaxants Failed - 09/29/2020  2:21 PM      Failed - This refill cannot be delegated      Passed - Valid encounter within last 6 months    Recent Outpatient Visits           2 months ago Chronic pain of both knees   [place], DO   5 months ago Abnormal urine odor   [place], [place], FNP   10 months ago Morbid obesity with BMI of 50.0-59.9, adult [facility])   [facility] [name], DO   1 year ago Foul smelling urine   [place], DO   1 year ago Acute bilateral low back pain without sciatica   [place], Devonne Doughty, DO

## 2020-09-29 NOTE — Telephone Encounter (Signed)
Requested medication (s) are due for refill today:  yes  Requested medication (s) are on the active medication list: yes  Last refill:  09/03/2020  Future visit scheduled: no  Notes to clinic:  this refill cannot be delegated    Requested Prescriptions  Pending Prescriptions Disp Refills   cyclobenzaprine (FLEXERIL) 10 MG tablet [Pharmacy Med Name: CYCLOBENZAPRINE 10 MG TABLET] 90 tablet 2    Sig: TAKE ONE TABLET BY MOUTH THREE TIMES A DAY AS NEEDED FOR MUSCLE SPASMS      Not Delegated - Analgesics:  Muscle Relaxants Failed - 09/29/2020  2:21 PM      Failed - This refill cannot be delegated      Passed - Valid encounter within last 6 months    Recent Outpatient Visits           2 months ago Chronic pain of both knees   [place], DO   5 months ago Abnormal urine odor   [place], [place], FNP   10 months ago Morbid obesity with BMI of 50.0-59.9, adult [facility])   [place], DO   1 year ago Foul smelling urine   [place], DO   1 year ago Acute bilateral low back pain without sciatica   [place], Devonne Doughty, DO

## 2020-12-02 ENCOUNTER — Ambulatory Visit: Payer: Self-pay | Admitting: Family Medicine

## 2020-12-04 ENCOUNTER — Ambulatory Visit: Payer: Self-pay | Admitting: Family Medicine

## 2020-12-04 ENCOUNTER — Encounter: Payer: Self-pay | Admitting: Family Medicine

## 2020-12-04 ENCOUNTER — Other Ambulatory Visit: Payer: Self-pay

## 2020-12-04 VITALS — BP 150/87 | HR 110 | Ht 69.0 in | Wt 354.0 lb

## 2020-12-04 DIAGNOSIS — R8290 Unspecified abnormal findings in urine: Secondary | ICD-10-CM

## 2020-12-04 DIAGNOSIS — B379 Candidiasis, unspecified: Secondary | ICD-10-CM

## 2020-12-04 DIAGNOSIS — T3695XA Adverse effect of unspecified systemic antibiotic, initial encounter: Secondary | ICD-10-CM

## 2020-12-04 DIAGNOSIS — N3001 Acute cystitis with hematuria: Secondary | ICD-10-CM

## 2020-12-04 DIAGNOSIS — K219 Gastro-esophageal reflux disease without esophagitis: Secondary | ICD-10-CM

## 2020-12-04 LAB — POCT URINALYSIS DIPSTICK
Bilirubin, UA: NEGATIVE
Glucose, UA: NEGATIVE
Ketones, UA: NEGATIVE
Nitrite, UA: POSITIVE
Protein, UA: POSITIVE — AB
Spec Grav, UA: 1.025 (ref 1.010–1.025)
Urobilinogen, UA: 0.2 E.U./dL
pH, UA: 5 (ref 5.0–8.0)

## 2020-12-04 MED ORDER — OMEPRAZOLE 20 MG PO CPDR: 20.0000 mg | DELAYED_RELEASE_CAPSULE | Freq: Every day | ORAL | 3 refills | Status: DC | PRN

## 2020-12-04 MED ORDER — FLUCONAZOLE 150 MG PO TABS: ORAL_TABLET | ORAL | 1 refills | Status: DC

## 2020-12-04 MED ORDER — SULFAMETHOXAZOLE-TRIMETHOPRIM 800-160 MG PO TABS: 1.0000 | ORAL_TABLET | Freq: Two times a day (BID) | ORAL | 0 refills | Status: DC

## 2020-12-04 NOTE — Patient Instructions (Addendum)
Thank you for coming to the office today.  1. You have a Urinary Tract Infection - this is very common, your symptoms are reassuring and you should get better within 1 week on the antibiotics - Start Bactrim-DS 2 times daily for next 5 days, complete entire course, even if feeling better - We sent urine for a culture, we will call you within next few days if we need to change antibiotics - Please drink plenty of fluids, improve hydration over next 1 week  If symptoms worsening, developing nausea / vomiting, worsening back pain, fevers / chills / sweats, then please return for re-evaluation sooner.  If you take AZO OTC - limit this to 2-3 days MAX to avoid affecting kidneys  D-Mannose is a natural supplement that can actually help bind to urinary bacteria and reduce their effectiveness it can help prevent UTI from forming, and may reduce some symptoms. It likely cannot cure an active UTI but it is worth a try and good to prevent them with. Try 566m twice a day at a full dose if you want, or check package instructions for more info   Please schedule a Follow-up Appointment to: Return if symptoms worsen or fail to improve.  If you have any other questions or concerns, please feel free to call the office or send a message through [place] You may also schedule an earlier appointment if necessary.  Additionally, you may be receiving a survey about your experience at our office within a few days to 1 week by e-mail or mail. We value your feedback.  ANobie Putnam DO [facility] [place]

## 2020-12-04 NOTE — Progress Notes (Signed)
Subjective:    Patient ID: Maria Jacobson, female    DOB: 03-Mar-1976, 45 y.o.   MRN: [id number]  Maria Jacobson is a 45 y.o. female presenting on 12/04/2020 for Urinary Tract Infection  Patient presents for a same day appointment.  HPI   UTI Last UTI 05/2020 treated by Cyndia Skeeters, FNP w/ Bactrim-DS with good results, had E Coli on culture with resistance to Ampicillin. - Interval update she has been on [place] which is a D-Mannose supplement for urinary tract prevention to bind bacteria - She has recent onset symptoms unsure how long been present. Urinary odor and typical UTI symptoms frequency some dysuria at times unsure trigger  She would like to resume contact w/ CCM team, has not completed charity care yet  Depression screen [facility] 2/9 07/13/2020 11/14/2019 02/08/2019  Decreased Interest 1 2 0  Down, Depressed, Hopeless 1 3 0  PHQ - 2 Score 2 5 0  Altered sleeping 1 1 0  Tired, decreased energy 2 3 0  Change in appetite 1 1 0  Feeling bad or failure about yourself  3 3 0  Trouble concentrating 2 3 0  Moving slowly or fidgety/restless 1 0 0  Suicidal thoughts 0 0 0  PHQ-9 Score 12 16 0  Difficult doing work/chores Somewhat difficult Extremely dIfficult Not difficult at all    Social History   Tobacco Use  . Smoking status: Never Smoker  . Smokeless tobacco: Never Used  Vaping Use  . Vaping Use: Never used  Substance Use Topics  . Alcohol use: Not Currently    Comment: rarely  . Drug use: No    Review of Systems Per HPI unless specifically indicated above     Objective:    BP (!) 150/87   Pulse (!) 110   Ht 5' 9"  (1.753 m)   Wt (!) 354 lb (160.6 kg)   SpO2 100%   BMI 52.28 kg/m   Wt Readings from Last 3 Encounters:  12/04/20 (!) 354 lb (160.6 kg)  07/13/20 (!) 339 lb 9.6 oz (154 kg)  06/01/20 (!) 342 lb 12.8 oz (155.5 kg)    Physical Exam Vitals and nursing note reviewed.  Constitutional:      General: She is not in acute distress.    Appearance: She is  well-developed. She is not diaphoretic.     Comments: Well-appearing, comfortable, cooperative  HENT:     Head: Normocephalic and atraumatic.  Eyes:     General:        Right eye: No discharge.        Left eye: No discharge.     Conjunctiva/sclera: Conjunctivae normal.  Cardiovascular:     Rate and Rhythm: Normal rate.  Pulmonary:     Effort: Pulmonary effort is normal.  Skin:    General: Skin is warm and dry.     Findings: No erythema or rash.  Neurological:     Mental Status: She is alert and oriented to person, place, and time.  Psychiatric:        Behavior: Behavior normal.     Comments: Well groomed, good eye contact, normal speech and thoughts    Results for orders placed or performed in visit on 12/04/20  POCT Urinalysis Dipstick  Result Value Ref Range   Color, UA Yellow    Clarity, UA Cloudy    Glucose, UA Negative Negative   Bilirubin, UA Negative    Ketones, UA Negative    Spec Grav, UA 1.025 1.010 -  1.025   Blood, UA Trace    pH, UA 5.0 5.0 - 8.0   Protein, UA Positive (A) Negative   Urobilinogen, UA 0.2 0.2 or 1.0 E.U./dL   Nitrite, UA Positive    Leukocytes, UA Trace (A) Negative   Appearance     Odor        Assessment & Plan:   Problem List Items Addressed This Visit    GERD (gastroesophageal reflux disease)   Relevant Medications   omeprazole (PRILOSEC) 20 MG capsule    Other Visit Diagnoses    Acute cystitis with hematuria    -  Primary   Relevant Medications   sulfamethoxazole-trimethoprim (BACTRIM DS) 800-160 MG tablet   Foul smelling urine       Relevant Orders   POCT Urinalysis Dipstick (Completed)   Urine Culture   Antibiotic-induced yeast infection       Relevant Medications   fluconazole (DIFLUCAN) 150 MG tablet   sulfamethoxazole-trimethoprim (BACTRIM DS) 800-160 MG tablet      UTI History of recurrent uti in past, now doing better Last UTI 05/2020 Improved prophylaxis on D-Mannose formulation [place] Prior resistances on  cultures reviewed mostly E Coli history of resistant one in past Will re try Bactrim-DS successful last visit, 5 day BID dosing Add Diflucan as PRN for yeast  CC Chart to Noreene Larsson for review and find out if we can resume services, patient may benefit from establishing with [facility] through [facility] locally to see Orthopedic if available.  Orders Placed This Encounter  Procedures  . Urine Culture  . POCT Urinalysis Dipstick      Meds ordered this encounter  Medications  . fluconazole (DIFLUCAN) 150 MG tablet    Sig: Take one tablet by mouth on Day 1. Repeat dose 2nd tablet on Day 3.    Dispense:  2 tablet    Refill:  1  . sulfamethoxazole-trimethoprim (BACTRIM DS) 800-160 MG tablet    Sig: Take 1 tablet by mouth 2 (two) times daily.    Dispense:  10 tablet    Refill:  0  . omeprazole (PRILOSEC) 20 MG capsule    Sig: Take 1 capsule (20 mg total) by mouth daily as needed.    Dispense:  30 capsule    Refill:  3      Follow up plan: Return if symptoms worsen or fail to improve.    [name], [facility] 12/04/2020, 10:00 AM

## 2020-12-06 LAB — URINE CULTURE
MICRO NUMBER:: 11861015
SPECIMEN QUALITY:: ADEQUATE

## 2020-12-08 ENCOUNTER — Telehealth: Payer: Self-pay

## 2020-12-08 NOTE — Chronic Care Management (AMB) (Signed)
  Care Management   Note  12/08/2020 Name: Maria Jacobson MRN: [id number] DOB: 03-Feb-1976  Maria Jacobson is a 45 y.o. year old female who is a primary care patient of [name], DO and is actively engaged with the care management team. I reached out to [name] by phone today to assist with scheduling a follow up visit with the RN Case Manager  Follow up plan: Unsuccessful telephone outreach attempt made. A HIPAA compliant phone message was left for the patient providing contact information and requesting a return call.  The care management team will reach out to the patient again over the next 5 days.  If patient returns call to provider office, please advise to call [place]  at [place], [place] [address], [place], [place] [address] Direct Dial: [phone] [email or web address]: [email or web address]

## 2020-12-15 ENCOUNTER — Telehealth: Payer: Self-pay | Admitting: Licensed Clinical Social Worker

## 2020-12-15 NOTE — Telephone Encounter (Signed)
    Clinical Social Work  Care Management   Phone Outreach    12/15/2020 Name: Annalysia Willenbring MRN: [id number] DOB: 1975-09-15  Teja Costen is a 45 y.o. year old female who is a primary care patient of [name], DO .   CCM LCSW reached out to patient today by phone to introduce self, assess needs and offer Care Management services and interventions.    Telephone outreach was unsuccessful  Plan:CCM LCSW will wait for return call. If no return call is received, Will route chart to Care Guide to see if patient would like to reschedule phone appointment   Review of patient status, including review of consultants reports, relevant laboratory and other test results, and collaboration with appropriate care team members and the patient's provider was performed as part of comprehensive patient evaluation and provision of care management services.    Christa See, MSW, [facility] Care Management [email or web address] Phone [phone] 4:23 PM

## 2020-12-17 ENCOUNTER — Telehealth: Payer: Self-pay | Admitting: General Practice

## 2020-12-17 ENCOUNTER — Ambulatory Visit: Payer: Self-pay | Admitting: General Practice

## 2020-12-17 NOTE — Chronic Care Management (AMB) (Signed)
   12/17/2020  Maria Jacobson [birth year] [id number]  Error in opening encounter.

## 2020-12-17 NOTE — Telephone Encounter (Signed)
  Care Management   Follow Up Note   12/17/2020 Name: Steven Veazie MRN: [id number] DOB: 09-02-1975   Referred by: [name], DO Reason for referral : Care Coordination (RNCM: Follow up per MD request for Chronic Care Management and Care Coordination Needs )   An unsuccessful telephone outreach was attempted today. The patient was referred to the case management team for assistance with care management and care coordination.   Follow Up Plan: A HIPPA compliant phone message was left for the patient providing contact information and requesting a return call.   Noreene Larsson RN, MSN, [place] Mobile: [phone]

## 2020-12-23 ENCOUNTER — Other Ambulatory Visit: Payer: Self-pay | Admitting: Family Medicine

## 2020-12-23 DIAGNOSIS — M25562 Pain in left knee: Secondary | ICD-10-CM

## 2020-12-23 DIAGNOSIS — M5441 Lumbago with sciatica, right side: Secondary | ICD-10-CM

## 2020-12-23 DIAGNOSIS — G8929 Other chronic pain: Secondary | ICD-10-CM

## 2020-12-24 NOTE — Chronic Care Management (AMB) (Signed)
  Care Management   Note  12/24/2020 Name: Shekira Drummer MRN: [id number] DOB: Jul 26, 1976  Simone Rodenbeck is a 45 y.o. year old female who is a primary care patient of [name], DO and is actively engaged with the care management team. I reached out to [name] by phone today to assist with re-scheduling a follow up visit with the RN Case Manager Licensed Clinical Social Worker  Follow up plan: Unsuccessful telephone outreach attempt made. A HIPAA compliant phone message was left for the patient providing contact information and requesting a return call.  The care management team will reach out to the patient again over the next 7 days.  If patient returns call to provider office, please advise to call [place]  at [place], [place] [address], [place], [place] [address] Direct Dial: [phone] [email or web address] Website: [email or web address]

## 2020-12-24 NOTE — Progress Notes (Signed)
Also trying to get in touch with patient for Maria Jacobson still can not reach her

## 2020-12-31 NOTE — Chronic Care Management (AMB) (Signed)
  Care Management   Note  12/31/2020 Name: Maria Jacobson MRN: [id number] DOB: 06-21-1976  Maria Jacobson is a 45 y.o. year old female who is a primary care patient of [name], DO and is actively engaged with the care management team. I reached out to [name] by phone today to assist with re-scheduling a follow up visit with the RN Case Manager Licensed Clinical Social Worker  Follow up plan: Unable to make contact on outreach attempts x 3. PCP [name], DO notified via routed documentation in medical record.   Noreene Larsson, [place], [place], [place] [address] Direct Dial: [phone] [email or web address]

## 2020-12-31 NOTE — Telephone Encounter (Signed)
Multiple unsuccessful outreaches

## 2021-02-16 ENCOUNTER — Other Ambulatory Visit: Payer: Self-pay | Admitting: Family Medicine

## 2021-02-16 DIAGNOSIS — R600 Localized edema: Secondary | ICD-10-CM

## 2021-02-16 NOTE — Telephone Encounter (Signed)
  Notes to clinic:  review for continued use and refill  Looks like it was suppose to be taking for a short amount of time    Requested Prescriptions  Pending Prescriptions Disp Refills   furosemide (LASIX) 20 MG tablet [Pharmacy Med Name: FUROSEMIDE 20 MG TABLET] 90 tablet 0    Sig: TAKE 1 TABLET BY MOUTH EVERY DAY FOR SWELLING, MAY INCREASE TO 2 TABLETS IF NEEDED FOR MAX 7 DAYS PER FLARE      Cardiovascular:  Diuretics - Loop Failed - 02/16/2021  9:34 AM      Failed - K in normal range and within 360 days    Potassium  Date Value Ref Range Status  10/28/2019 4.4 3.5 - 5.3 mmol/L Final          Failed - Ca in normal range and within 360 days    Calcium  Date Value Ref Range Status  10/28/2019 9.5 8.6 - 10.2 mg/dL Final          Failed - Na in normal range and within 360 days    Sodium  Date Value Ref Range Status  10/28/2019 137 135 - 146 mmol/L Final          Failed - Cr in normal range and within 360 days    Creat  Date Value Ref Range Status  10/28/2019 0.95 0.50 - 1.10 mg/dL Final          Failed - Last BP in normal range    BP Readings from Last 1 Encounters:  12/04/20 (!) 150/87          Passed - Valid encounter within last 6 months    Recent Outpatient Visits           2 months ago Acute cystitis with hematuria   [place], DO   7 months ago Chronic pain of both knees   [place], DO   9 months ago Abnormal urine odor   [place], FNP   1 year ago Morbid obesity with BMI of 50.0-59.9, adult [facility])   [place], DO   1 year ago Foul smelling urine   [place], Devonne Doughty, [place]

## 2021-03-18 ENCOUNTER — Ambulatory Visit: Payer: Self-pay | Admitting: General Practice

## 2021-03-18 DIAGNOSIS — F314 Bipolar disorder, current episode depressed, severe, without psychotic features: Secondary | ICD-10-CM

## 2021-03-18 DIAGNOSIS — M25562 Pain in left knee: Secondary | ICD-10-CM

## 2021-03-18 DIAGNOSIS — M5441 Lumbago with sciatica, right side: Secondary | ICD-10-CM

## 2021-03-18 DIAGNOSIS — F419 Anxiety disorder, unspecified: Secondary | ICD-10-CM

## 2021-03-18 DIAGNOSIS — E782 Mixed hyperlipidemia: Secondary | ICD-10-CM

## 2021-03-18 DIAGNOSIS — G8929 Other chronic pain: Secondary | ICD-10-CM

## 2021-03-18 DIAGNOSIS — M2550 Pain in unspecified joint: Secondary | ICD-10-CM

## 2021-03-18 NOTE — Chronic Care Management (AMB) (Signed)
Care Management    RN Visit Note  03/18/2021 Name: Maria Jacobson MRN: [id number] DOB: 09/15/1975  Subjective: Maria Jacobson is a 45 y.o. year old female who is a primary care patient of [name], DO. The care management team was consulted for assistance with disease management and care coordination needs.    Engaged with patient by telephone for initial visit in response to provider referral for case management and/or care coordination services.   Consent to Services:   [name] was given information about Care Management services today including:  Care Management services includes personalized support from designated clinical staff supervised by her physician, including individualized plan of care and coordination with other care providers 24/7 contact phone numbers for assistance for urgent and routine care needs. The patient may stop case management services at any time by phone call to the office staff.  Patient agreed to services and consent obtained.   Assessment: Review of patient past medical history, allergies, medications, health status, including review of consultants reports, laboratory and other test data, was performed as part of comprehensive evaluation and provision of chronic care management services.   SDOH (Social Determinants of Health) assessments and interventions performed:  SDOH Interventions    Flowsheet Row Most Recent Value  SDOH Interventions   Physical Activity Interventions Other (Comments)  [limited mobility due to chronic pain]  Stress Interventions Other (Comment)  [has had a hard year but is ready to take ownership of her health now]  Social Connections Interventions Other (Comment)  [has great support from [place] her significant other]  Depression Interventions/Treatment  --  [name] to work with CCM team, sees psychiatry regularly, great support system]        Care Plan  Allergies  Allergen Reactions   Drug Ingredient [Black  Walnut Pollen Allergy Skin Test]    Hazelnut ([place]) Allergy Skin Test    Pecan Extract Allergy Skin Test    Shellfish Allergy Hives    Outpatient Encounter Medications as of 03/18/2021  Medication Sig Note   ABILIFY MAINTENA 400 MG PRSY INTRAMUSCULAR EVERY 4 WEEKS    acetaminophen (TYLENOL) 500 MG tablet Take 1,000 mg by mouth every 4 (four) hours as needed for headache.    albuterol (VENTOLIN HFA) 108 (90 Base) MCG/ACT inhaler INHALE 2 PUFFS INTO LUNGS EVERY 4 HOURS AS NEEDED FOR WHEEZING OR SHORTNESS OF BREATH (COUGH).    ALPRAZolam (XANAX) 1 MG tablet Take 1 mg by mouth at bedtime as needed for anxiety.    amphetamine-dextroamphetamine (ADDERALL) 30 MG tablet Take 30 mg by mouth daily. 12/04/2019: QAM   ARIPiprazole (ABILIFY) 15 MG tablet Take 15 mg by mouth daily.    clonazePAM (KLONOPIN) 0.5 MG tablet Take 0.5 mg by mouth 5 (five) times daily.    cyclobenzaprine (FLEXERIL) 10 MG tablet TAKE ONE TABLET BY MOUTH THREE TIMES A DAY AS NEEDED FOR MUSCLE SPASMS    fexofenadine (ALLEGRA) 180 MG tablet Take 180 mg by mouth daily as needed for allergies or rhinitis.    fluconazole (DIFLUCAN) 150 MG tablet Take one tablet by mouth on Day 1. Repeat dose 2nd tablet on Day 3.    furosemide (LASIX) 20 MG tablet TAKE 1 TABLET BY MOUTH EVERY DAY FOR SWELLING, MAY INCREASE TO 2 TABLETS IF NEEDED FOR MAX 7 DAYS PER FLARE    lamoTRIgine (LAMICTAL) 100 MG tablet Take 200 mg by mouth 2 (two) times daily. Taking 200 mg (2 tablets) each morning and 150 mg (1.5 tablets) each evening  melatonin 3 MG TABS tablet Take 3 mg by mouth at bedtime.    meloxicam (MOBIC) 15 MG tablet TAKE 1 TABLET BY MOUTH DAILY    omeprazole (PRILOSEC) 20 MG capsule Take 1 capsule (20 mg total) by mouth daily as needed.    sulfamethoxazole-trimethoprim (BACTRIM DS) 800-160 MG tablet Take 1 tablet by mouth 2 (two) times daily.    Triamcinolone Acetonide (NASACORT ALLERGY 24HR NA) Place into the nose daily as needed.    No  facility-administered encounter medications on file as of 03/18/2021.    Patient Active Problem List   Diagnosis Date Noted   Abnormal urine 05/01/2020   Abnormal urine odor 05/01/2020   Fatigue 05/01/2020   GERD (gastroesophageal reflux disease) 09/25/2019   Epidermal cyst of neck 06/13/2017   Atypical mole 10/05/2016   Bilateral lower extremity edema 06/15/2016   Pain and swelling of left lower leg 06/15/2016   Pain in joint, multiple sites 06/15/2016   Family history of rheumatoid arthritis 06/15/2016   Chronic bilateral low back pain with right-sided sciatica 05/24/2016   Left medial knee pain 05/23/2016   Anxiety 05/23/2016   Bipolar affective disorder, depressed, severe ([place]) 05/23/2016   Morbid obesity with BMI of 50.0-59.9, adult ([place]) 05/23/2016   Hyperlipidemia 05/23/2016    Conditions to be addressed/monitored: HLD, Anxiety, Depression, Bipolar Disorder, and chronic pain   Care Plan : RNCM: Adult General plan of care: Chronic pain, anxiety, depression, bipolar and HLD  Updates made by Vanita Ingles since 03/18/2021 12:00 AM     Problem: RNCM: Adult General plan of care: Chronic pain, anxiety, depression, bipolar and HLD   Priority: High  Onset Date: 03/18/2021     Long-Range Goal: RNCM: Adult General plan of care: Chronic pain, anxiety, depression, bipolar and HLD   Start Date: 03/18/2021  Expected End Date: 03/18/2022  This Visit's Progress: On track  Priority: High  Note:   Current Barriers:  Knowledge Deficits related to plan of care for management of HLD, Anxiety with Excessive Worry, Social Anxiety,, Depression: depressed mood, fatigue, hopelessness, anxiety, loss of energy/fatigue,, and Bipolar Disorder, and chronic pain   Care Coordination needs related to Financial constraints related to the ability to afford reliable and consistent healthcare for chronic conditions, Level of care concerns, and Mental Health Concerns   Chronic Disease Management support  and education needs related to HLD, Anxiety with Excessive Worry, Social Anxiety,, Depression: depressed mood, fatigue, hopelessness, anxiety, loss of energy/fatigue,, and Bipolar Disorder, and chronic pain  Financial Constraints.  Non-adherence to scheduled provider appointments  RNCM Clinical Goal(s):  Patient will verbalize understanding of plan for management of HLD, Anxiety, Depression, Bipolar Disorder, and chronic pain  verbalize basic understanding of HLD, Anxiety, Depression, Bipolar Disorder, and chronic pain  disease process and self health management plan by working with the CCM team to aide and assist in finding affordable health care to meet health and wellness goals take all medications exactly as prescribed and will call provider for medication related questions demonstrate understanding of rationale for each prescribed medication and take as directed  demonstrate improved and ongoing adherence to prescribed treatment plan for HLD, Anxiety, Depression, Bipolar Disorder, and chronic pain  as evidenced by adherence to prescribed medication regimen contacting provider for new or worsened symptoms or questions and obtaining charity care of [facility] to be able to help with chronic health conditions   demonstrate improved and ongoing health management independence by effective management of chronic conditions  continue to work with RN  Care Manager to address care management and care coordination needs related to HLD, Anxiety, Depression, Bipolar Disorder, and chronic pain  work with Education officer, museum to address Financial constraints related to health care cost , Level of care concerns, and Mental Health Concerns  related to the management of HLD, Anxiety, Depression, Bipolar Disorder, and chronic pain  demonstrate a decrease in HLD, Anxiety, Depression, Bipolar Disorder, and chronic pain  exacerbations   demonstrate ongoing self health care management ability by effective management of chronic  conditions  through collaboration with RN Care manager, provider, and care team.   Interventions: 1:1 collaboration with primary care provider regarding development and update of comprehensive plan of care as evidenced by provider attestation and co-signature Inter-disciplinary care team collaboration (see longitudinal plan of care) Evaluation of current treatment plan related to  self management and patient's adherence to plan as established by provider   SDOH Barriers (Status: New goal. 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, sees psychiatry regularly, great support system]     Patient interviewed and appropriate assessments performed Provided patient with information about [facility] care and [facility], the patient has completed charity care paperwork and mailed recently, waiting to hear back from the approval process Discussed plans with patient for ongoing care management follow up and provided patient with direct contact information for care management team Advised patient to work with CCM team, look at [employer] paperwork, and look for charity care information Collaborated with RN Case Manager re: effective management of chronic conditions and help with obtaining needed healthcare for chronic conditions.  Provided education to patient/caregiver regarding level of care options.    Anxiety, depression and bipolar  (Status: New goal. Goal on track: YES.) Evaluation of current treatment plan related to Anxiety,  Depression, and Bipolar Disorder, Financial constraints related to affordable healthcare , Level of care concerns, and Mental Health Concerns  self-management and patient's adherence to plan as established by provider. Discussed plans with patient for ongoing care management follow up and provided patient with direct contact information for care management team Advised patient to look at [place] paperwork and see if this is something she feels she would benefit from ; Provided education to patient re: [facility] and charity care and working with the CCM team to meet mental health needs and other chronic conditions management ; Reviewed medications with patient and discussed complinace. The patient went to [facility] today to pick up psychiatric medications. ; Collaborated with LCSW and pcp regarding patient completing charity care paperwork; Provided patient with [facility] and charity care educational materials related to assistance with meeting healthcare needs and receiveing care for chronic conditions; Social Work referral for education and support for anxiety, depression, and bipolar disorder; Discussed plans with patient for ongoing care management follow up and provided patient with direct contact information for care management team; Screening for signs and symptoms of depression related to chronic disease state;  Assessed social determinant of health barriers;   Health Maintenance (Status: New goal. Goal on track:  YES.)  Patient interviewed about adult health maintenance status including Depression screen    Chronic pain management and support   Advised patient to discuss Depression screen    Chronic pain management  with primary care provider  Provided education about working with the CCM team for ongoing support and education for chronic conditions impacting the patients care      Hyperlipidemia:  (Status: New goal. Goal on track: YES.) Lab Results  Component Value Date   CHOL 233 (H)  10/28/2019   HDL 50 10/28/2019   LDLCALC 153 (H) 10/28/2019   TRIG 165 (H) 10/28/2019   CHOLHDL 4.7 10/28/2019     Medication review performed; medication list updated in electronic medical record.  Provider established cholesterol goals reviewed; Counseled on importance of regular laboratory monitoring as prescribed; Provided HLD educational materials; Reviewed role and benefits of statin for ASCVD risk reduction; Reviewed importance of limiting foods high in cholesterol;  Pain:  (Status: New goal.) Pain assessment performed Medications reviewed Reviewed provider established plan for pain management; Discussed importance of adherence to all scheduled medical appointments; Counseled on the importance of reporting any/all new or changed pain symptoms or management strategies to pain management provider; Advised patient to report to care team affect of pain on daily activities; Discussed use of relaxation techniques and/or diversional activities to assist with pain reduction (distraction, imagery, relaxation, massage, acupressure, TENS, heat, and cold application; Reviewed with patient prescribed pharmacological and nonpharmacological pain relief strategies; Advised patient to discuss referral for assessment and treatment options for chronic pain and discomfort  with provider;  Patient Goals/Self-Care Activities: Patient will self administer medications as prescribed Patient will attend all scheduled provider appointments Patient will call pharmacy for medication refills Patient will attend church or other social activities Patient will continue to perform ADL's independently Patient will continue to perform IADL's independently Patient will call provider office for new concerns or questions Patient will work with BSW to address care coordination needs and will continue to work with the clinical team to address health care and disease management related needs.   Patient will receive  needed healthcare for chronic conditions and health and wellness maintainence       Plan: Telephone follow up appointment with care management team member scheduled for:  04-29-2021 at [place] am  Noreene Larsson RN, MSN, [place] Mobile: [phone]

## 2021-03-18 NOTE — Patient Instructions (Signed)
Visit Information  PATIENT GOALS:   Goals Addressed               This Visit's Progress     RNCM: Cope with Chronic Pain        Timeframe:  Long-Range Goal Priority:  High Start Date:     03-18-2021                        Expected End Date:     03-18-2022                  Follow Up Date 04/29/2021    - join a support group - learn how to meditate - learn relaxation techniques - practice acceptance of chronic pain - practice relaxation or meditation daily - spend time with positive people - tell myself I can (not I can't) - think of new ways to do favorite things - use distraction techniques    Why is this important?   Stress makes chronic pain feel worse.  Feelings like depression, anxiety, stress and anger can make your body more sensitive to pain.  Learning ways to cope with stress or depression may help you find some relief from the pain.     Notes: 03-18-2021: Has worked with [facility] in the past. Has lots of issues with chronic pain. The patient has completed her paperwork for [facility] care and has mailed it off. Also sent paperwork for the patient today for CAFA- application for help in the [facility] health system. The patient is ready to feel better and take ownership of her health and well being.       RNCM: Manage My Emotions        Timeframe:  Long-Range Goal Priority:  High Start Date:     03-18-2021                        Expected End Date:    03-18-2022                   Follow Up Date 04/29/2021    - call and visit an old friend - join a support group - laugh; watch a funny movie or comedian - learn and use visualization or guided imagery - perform a random act of kindness - practice relaxation or meditation daily - start or continue a personal journal - talk about feelings with a friend, family or spiritual advisor - practice positive thinking and self-talk    Why is this important?   When you are stressed, down or upset, your body reacts too.  For example, your  blood pressure may get higher; you may have a headache or stomachache.  When your emotions get the best of you, your body's ability to fight off cold and flu gets weak.  These steps will help you manage your emotions.     Notes: 03-18-2021: The patient had been out of contact with the CCM team. Call to the patient today and the patient agreed to talk with [facility] and work with the team. The patient has submitted her paperwork for [facility]. This is a huge accomplishment for her. She talked about how she has been in a hard and touch place for a very long time but is feeling better now and wants to see what she can do to improve her health and well being. Celebrated victory with the patient. The patient states today has been a really good  day and she was actually looking forward to going out with [name] to eat tonight. She went to [facility] this morning to get her psychiatric medications. Will continue to monitor.       COMPLETED: RNCM: pt-"Most days I have to stay in bed due to the chronic pain I have" (pt-stated)        CARE PLAN ENTRY (see longtitudinal plan of care for additional care plan information)  Current Barriers: Closing this goal and opening in new ELS Chronic Disease Management support, education, and care coordination needs related to HLD, Anxiety, Depression, Bipolar Disorder, and Chronic pain Financial constraints ADL/IADL limitations  Clinical Goal(s) related to HLD, Anxiety, Depression, Bipolar Disorder, and chronic pain :  Over the next 120 days, patient will:  Work with the care management team to address educational, disease management, and care coordination needs  Begin or continue self health monitoring activities as directed today  adhere to a heart healthy diet and utilize coping mechanisms for depression, anxiety, and bipolar episodes  Call provider office for new or worsened signs and symptoms Shortness of breath and New or worsened symptom related to swelling in legs,  anxiety, depression and biploar  Call care management team with questions or concerns Verbalize basic understanding of patient centered plan of care established today  Interventions related to HLD, Anxiety, Depression, Bipolar Disorder, and Chronic pain  :  Evaluation of current treatment plans and patient's adherence to plan as established by provider.  The patient is wanting to make positive changes in her life that will help her improve her health and well being. The patient is working with the [facility] and CCM team to meet her health and well being goals. Making positive changes.  Since last call the patient has lost about 14 pounds by altering dietary intake. 02-10-2020: The patient continues to make progress in her health and well being. She say OB/GYN last week and will see the dentist this week to have 2 cavities fixed.  Her appointments are on Wednesday and Thursday. The patient will then be ready to have her orthopedic appointment. 04-13-2020: The patient called today but it was a brief call. The patient was tearful and upset. Her and her fiance had just returned from [place] from the service of a friend of hers that committed suicide. The friend had bipolar also and she reflected briefly on how this has affected her but how she felt better since going to the service. The patient denies suicidal ideation and just wanted to lay down and get some rest. Will plan to reach out to the patient over the next couple of weeks. Emotional support given. The patient knows she can call or text the [facility] for support. Will continue to monitor for changes.  Assessed patient understanding of disease states.  The patient has a good understanding of her chronic conditions. She sees pcp and psychiatrist.  Willing to work with the CCM team to help with the chronic conditions she has. Assessed patient's education and care coordination needs.  The patient is unable to work due to her health. She needs help with filing for  disability and charity care so she can be seen by orthopedic provider.  The patient also needs dental work. Care guide referral for assistance in applying for disability, charity care and dental resources in [place]. The patient has an appointment on 01-02-2020 to get her ID card updated. This should be the last document she needs to complete the charity care application.  The patient is calling the CCM team care guides back today to help with disability paperwork and other needs. The patient wants to do what she can to improve her health and well being. Praised for accomplishments. 02-10-2020: The patient has her ID card and is completing paperwork and ready for submission this week. The patient is doing good and has made significant progress in her health and wellness goals. She has been able to get out and visit with her grandmother and her father and this was very beneficial for her. The patient states that she wants to be able to feel better so she can help herself and others. Encouraged the patient to keep striving for her goals.  Provided disease specific education to patient.  Education on the benefits of Heart healthy diet.  The patient is ready to make dietary changes to help with weight reduction, decrease swelling in her legs, and manage her HLD more effectively.  Will send education material through the My chart system and EMMI for heart healthy diet. The patient has cut out red meats and has lost 14 pounds since the last outreach with the [facility]. Praised for accomplishments.  Assessed mammogram appointment. The patient had a "BSAP" appointment last week but had to cancel due to a family friend passing away.  The patient has a new appointment scheduled for June 1st.  The patient is wanting to get back on track with her health and is doing great things toward improving her health and well being.  Assessed the patients pain level today. She rates her pain at "miserable"  The patient says the pain is  unmanageable at times and she has to stay in bed all  day. She is very limited in her mobility.  The patient is open to ideas and suggestions to help with pain control.  Working with the CCM team care guides to get approved for charity care and disability. 02-10-2020: The patient is still having pain but it varies. She is working on getting an appointment with orthopedic doctor so her pain issues can be addressed. She knows if she can be more mobile this will help her with other areas of her health. She is optimistic about her future and improved health and wellness.  Collaborated with appropriate clinical care team members regarding patient needs Referral placed for pharmacist: [facility] and any recommendations for improved help and well being/pain control Referral for LCSW: The patient has a long standing history of anxiety, depression, and bipolar disorder. The patient also needs assistance and guidance on charity care. The patient has a psychiatrist but welcomes any support and help in dealing with her conditions. She has a significant other, "[name]", that is very active in her care and a great support to the patient. 04-13-2020: The patient has had a lot of loss and recently dealt with the loss of a friend to suicide. Would benefit from LCSW support.  Assessed and reviewed medications with the patient . The patient is compliant with current medication regimen. Updated list today.  Encouraged the patient to reach out to the care guide team to get help with paperwork.  The patient is going to call today.  Also the [facility] will follow up on referral for LCSW.  The patient is thankful for the CCM support and help to get what she needs to take better care of herself. The patient states she is motivated to do what she can to help herself and improve her health and well being.  Patient Self Care Activities related to HLD, Anxiety, Depression, Bipolar Disorder, and Chronic pain :  Patient is unable to  independently self-manage chronic health conditions  Please see past updates related to this goal by clicking on the "Past Updates" button in the selected goal          Ms. Ransom was given information about Care Management services by the embedded care coordination team including:  Care Management services include personalized support from designated clinical staff supervised by her physician, including individualized plan of care and coordination with other care providers 24/7 contact phone numbers for assistance for urgent and routine care needs. The patient may stop CCM services at any time (effective at the end of the month) by phone call to the office staff.  Patient agreed to services and verbal consent obtained.   Patient verbalizes understanding of instructions provided today and agrees to view in [place].   Telephone follow up appointment with care management team member scheduled for: 04-29-2021 at [place] am  Noreene Larsson RN, MSN, [place] Mobile: [phone]

## 2021-03-19 NOTE — Progress Notes (Signed)
Patient has been scheduled with LCSW

## 2021-03-22 ENCOUNTER — Other Ambulatory Visit: Payer: Self-pay | Admitting: Family Medicine

## 2021-03-22 DIAGNOSIS — G8929 Other chronic pain: Secondary | ICD-10-CM

## 2021-03-22 DIAGNOSIS — M25562 Pain in left knee: Secondary | ICD-10-CM

## 2021-03-22 NOTE — Telephone Encounter (Signed)
Requested medication (s) are due for refill today:  yes   Requested medication (s) are on the active medication list: yes   Last refill:  12/23/2020  Future visit scheduled: no  Notes to clinic:  failed protocol: HGB in normal range and within 360 days   Cr in normal range and within 360 days     Requested Prescriptions  Pending Prescriptions Disp Refills   meloxicam (MOBIC) 15 MG tablet [Pharmacy Med Name: MELOXICAM 15 MG TABLET] 90 tablet 0    Sig: TAKE ONE TABLET BY MOUTH DAILY     Analgesics:  COX2 Inhibitors Failed - 03/22/2021  7:52 AM      Failed - HGB in normal range and within 360 days    Hemoglobin  Date Value Ref Range Status  10/28/2019 14.9 11.7 - 15.5 g/dL Final          Failed - Cr in normal range and within 360 days    Creat  Date Value Ref Range Status  10/28/2019 0.95 0.50 - 1.10 mg/dL Final          Passed - Patient is not pregnant      Passed - Valid encounter within last 12 months    Recent Outpatient Visits           3 months ago Acute cystitis with hematuria   [place], DO   8 months ago Chronic pain of both knees   [place], DO   10 months ago Abnormal urine odor   [place], FNP   1 year ago Morbid obesity with BMI of 50.0-59.9, adult [facility])   [place], DO   1 year ago Foul smelling urine   [place], Devonne Doughty, [place]

## 2021-03-25 ENCOUNTER — Other Ambulatory Visit: Payer: Self-pay

## 2021-03-25 DIAGNOSIS — G8929 Other chronic pain: Secondary | ICD-10-CM

## 2021-03-25 MED ORDER — CYCLOBENZAPRINE HCL 10 MG PO TABS: 10.0000 mg | ORAL_TABLET | Freq: Three times a day (TID) | ORAL | 2 refills | Status: DC | PRN

## 2021-03-31 ENCOUNTER — Ambulatory Visit: Payer: Self-pay | Admitting: Licensed Clinical Social Worker

## 2021-04-06 NOTE — Chronic Care Management (AMB) (Signed)
Care Management Clinical Social Work Note  04/06/2021 Name: Maria Jacobson MRN: [id number] DOB: [birth year]  Maria Jacobson is a 45 y.o. year old female who is a primary care patient of [name], DO.  The Care Management team was consulted for assistance with chronic disease management and coordination needs.  Engaged with patient by telephone for initial visit in response to provider referral for social work chronic care management and care coordination services  Consent to Services:  Ms. Legault was given information about Care Management services today including:  Care Management services includes personalized support from designated clinical staff supervised by her physician, including individualized plan of care and coordination with other care providers 24/7 contact phone numbers for assistance for urgent and routine care needs. The patient may stop case management services at any time by phone call to the office staff.  Patient agreed to services and consent obtained.   Assessment: Review of patient past medical history, allergies, medications, and health status, including review of relevant consultants reports was performed today as part of a comprehensive evaluation and provision of chronic care management and care coordination services.  SDOH (Social Determinants of Health) assessments and interventions performed:    Advanced Directives Status: Not addressed in this encounter.  Care Plan  Allergies  Allergen Reactions   Drug Ingredient [Black Walnut Pollen Allergy Skin Test]    Hazelnut ([place]) Allergy Skin Test    Pecan Extract Allergy Skin Test    Shellfish Allergy Hives    Outpatient Encounter Medications as of 03/31/2021  Medication Sig Note   ABILIFY MAINTENA 400 MG PRSY INTRAMUSCULAR EVERY 4 WEEKS    acetaminophen (TYLENOL) 500 MG tablet Take 1,000 mg by mouth every 4 (four) hours as needed for headache.    albuterol (VENTOLIN HFA) 108 (90 Base)  MCG/ACT inhaler INHALE 2 PUFFS INTO LUNGS EVERY 4 HOURS AS NEEDED FOR WHEEZING OR SHORTNESS OF BREATH (COUGH).    ALPRAZolam (XANAX) 1 MG tablet Take 1 mg by mouth at bedtime as needed for anxiety.    amphetamine-dextroamphetamine (ADDERALL) 30 MG tablet Take 30 mg by mouth daily. 12/04/2019: QAM   ARIPiprazole (ABILIFY) 15 MG tablet Take 15 mg by mouth daily.    clonazePAM (KLONOPIN) 0.5 MG tablet Take 0.5 mg by mouth 5 (five) times daily.    cyclobenzaprine (FLEXERIL) 10 MG tablet Take 1 tablet (10 mg total) by mouth 3 (three) times daily as needed for muscle spasms.    fexofenadine (ALLEGRA) 180 MG tablet Take 180 mg by mouth daily as needed for allergies or rhinitis.    fluconazole (DIFLUCAN) 150 MG tablet Take one tablet by mouth on Day 1. Repeat dose 2nd tablet on Day 3.    furosemide (LASIX) 20 MG tablet TAKE 1 TABLET BY MOUTH EVERY DAY FOR SWELLING, MAY INCREASE TO 2 TABLETS IF NEEDED FOR MAX 7 DAYS PER FLARE    lamoTRIgine (LAMICTAL) 100 MG tablet Take 200 mg by mouth 2 (two) times daily. Taking 200 mg (2 tablets) each morning and 150 mg (1.5 tablets) each evening    melatonin 3 MG TABS tablet Take 3 mg by mouth at bedtime.    meloxicam (MOBIC) 15 MG tablet TAKE ONE TABLET BY MOUTH DAILY    omeprazole (PRILOSEC) 20 MG capsule Take 1 capsule (20 mg total) by mouth daily as needed.    sulfamethoxazole-trimethoprim (BACTRIM DS) 800-160 MG tablet Take 1 tablet by mouth 2 (two) times daily.    Triamcinolone Acetonide (NASACORT ALLERGY 24HR NA) Place into  the nose daily as needed.    No facility-administered encounter medications on file as of 03/31/2021.    Patient Active Problem List   Diagnosis Date Noted   Abnormal urine 05/01/2020   Abnormal urine odor 05/01/2020   Fatigue 05/01/2020   GERD (gastroesophageal reflux disease) 09/25/2019   Epidermal cyst of neck 06/13/2017   Atypical mole 10/05/2016   Bilateral lower extremity edema 06/15/2016   Pain and swelling of left lower leg  06/15/2016   Pain in joint, multiple sites 06/15/2016   Family history of rheumatoid arthritis 06/15/2016   Chronic bilateral low back pain with right-sided sciatica 05/24/2016   Left medial knee pain 05/23/2016   Anxiety 05/23/2016   Bipolar affective disorder, depressed, severe ([place]) 05/23/2016   Morbid obesity with BMI of 50.0-59.9, adult ([place]) 05/23/2016   Hyperlipidemia 05/23/2016    Conditions to be addressed/monitored: Anxiety and Bipolar Disorder; Financial constraints related to medical coverage and Mental Health Concerns   Care Plan : General Social Work (Adult)  Updates made by Rebekah [place]field, LCSW since 04/06/2021 12:00 AM     Problem: Coping Skills (General Plan of Care)      Goal: Coping Skills Enhanced   Start Date: 03/31/2021  This Visit's Progress: On track  Priority: High  Note:   Current barriers:   Severe Persistent Mental Health needs related to Bipolar Disorder and Anxiety Financial constraints related to medical coverage Needs Support, Education, and Care Coordination in order to meet unmet mental health needs. Clinical Goal(s): demonstrate a reduction in symptoms related to :Anxiety  and Bipolar Disorder  explore community resource options for unmet needs related to:No health insurance    Clinical Interventions:  Assessed patient's previous and current treatment, coping skills, support system and barriers to care  Patient was diagnosed with Bipolar and MDD approx. 13 years ago. She reports difficulty managing symptoms of anxiety Patient participates in medication management through [facility] in [facility] (approx. 20 years) Patient enjoys services provided and feels strongly supported. Has an appointment 04/02/21 She uses the public health pharmacy to assist with medication financial assistance Patient is interested in establishing therapy to address possible symptoms of PTSD. She is thinking about going to [place] includes fiance of  fifteen years, mother and son, Pierre Bali resides in [place] No resource needs. Applied for [employer] Patient agreed to contact [place] to initiate application for disability due to being unable to work for ten years  Patient has reviewed CAFA application and is trying to obtain requirements. CCM LCSW encouraged patient to contact LCSW with any questions or concerns regarding application Review various resources, discussed options and provided patient information about Department of Social Services ( CCM LCSW provided contact number ) Depression screen reviewed , Solution-Focused Strategies, Active listening / Reflection utilized , Emotional Supportive Provided, Participation in counseling encouraged , Verbalization of feelings encouraged , and Suicidal Ideation/Homicidal Ideation assessed: Denies SI/HI  1:1 collaboration with primary care provider regarding development and update of comprehensive plan of care as evidenced by provider attestation and co-signature Inter-disciplinary care team collaboration (see longitudinal plan of care) Patient Goals/Self-Care Activities: Over the next 120 days Attend scheduled appointments with providers Contact clinic with any questions or concerns Call [place] [phone] Follow up on insurance options [facility] Financial Assistance) Continue with compliance of taking medication         Christa See, MSW, [email or web address] Phone [phone]  [phone] 6:34 AM

## 2021-04-06 NOTE — Patient Instructions (Signed)
Visit Information   Goals Addressed             This Visit's Progress    Find Help in My Community   On track    Timeframe:  Long-Range Goal Priority:  Medium Start Date:       03/31/21                      Expected End Date:   05/31/21                    Follow Up Date 05/19/21    Patient Goals/Self-Care Activities: Over the next 120 days Attend scheduled appointments with providers Contact clinic with any questions or concerns Call [place] [phone] Follow up on insurance options [facility] Financial Assistance) Continue with compliance of taking medication         Patient verbalizes understanding of instructions provided today and agrees to view in [place].   Telephone follow up appointment with care management team member scheduled for:05/19/21  Christa See, MSW, [email or web address] Phone [phone] 6:35 AM

## 2021-04-19 ENCOUNTER — Encounter: Payer: Self-pay | Admitting: Family Medicine

## 2021-04-19 ENCOUNTER — Ambulatory Visit: Payer: Self-pay

## 2021-04-19 DIAGNOSIS — F419 Anxiety disorder, unspecified: Secondary | ICD-10-CM

## 2021-04-19 DIAGNOSIS — F314 Bipolar disorder, current episode depressed, severe, without psychotic features: Secondary | ICD-10-CM

## 2021-04-19 DIAGNOSIS — M25561 Pain in right knee: Secondary | ICD-10-CM

## 2021-04-19 DIAGNOSIS — G8929 Other chronic pain: Secondary | ICD-10-CM

## 2021-04-19 DIAGNOSIS — E782 Mixed hyperlipidemia: Secondary | ICD-10-CM

## 2021-04-19 NOTE — Patient Instructions (Signed)
Visit Information   Goals Addressed             This Visit's Progress    RNCM: Manage My Emotions       Timeframe:  Long-Range Goal Priority:  High Start Date:     03-18-2021                        Expected End Date:    03-18-2022                   Follow Up Date 04/29/2021    - call and visit an old friend - join a support group - laugh; watch a funny movie or comedian - learn and use visualization or guided imagery - perform a random act of kindness - practice relaxation or meditation daily - start or continue a personal journal - talk about feelings with a friend, family or spiritual advisor - practice positive thinking and self-talk    Why is this important?   When you are stressed, down or upset, your body reacts too.  For example, your blood pressure may get higher; you may have a headache or stomachache.  When your emotions get the best of you, your body's ability to fight off cold and flu gets weak.  These steps will help you manage your emotions.     Notes: 03-18-2021: The patient had been out of contact with the CCM team. Call to the patient today and the patient agreed to talk with [facility] and work with the team. The patient has submitted her paperwork for [facility]. This is a huge accomplishment for her. She talked about how she has been in a hard and touch place for a very long time but is feeling better now and wants to see what she can do to improve her health and well being. Celebrated victory with the patient. The patient states today has been a really good day and she was actually looking forward to going out with [name] to eat tonight. She went to [facility] this morning to get her psychiatric medications. Will continue to monitor. 04-19-2021: The patient called and ask for help with getting a letter from the pcp stating that she was unable to physically work so she could supply this to receive food stamps. In basket messaging sent to the pcp and the pcp has completed  the letter and it is reach for pick up at the MD office. The patient back and a VM was left. Did send a secure text letting the patient know the letter was ready for pick up. Will continue to monitor for changes and new needs.         Patient verbalizes understanding of instructions provided today and agrees to view in [place].   Telephone follow up appointment with care management team member scheduled for: 04-29-2021 Noreene Larsson RN, MSN, [place] Mobile: [phone]

## 2021-04-19 NOTE — Chronic Care Management (AMB) (Signed)
Care Management    RN Visit Note  04/19/2021 Name: Maria Jacobson MRN: [id number] DOB: [birth year]  Subjective: Maria Jacobson is a 45 y.o. year old female who is a primary care patient of [name], DO. The care management team was consulted for assistance with disease management and care coordination needs.    Engaged with patient by telephone for follow up visit in response to provider referral for case management and/or care coordination services.   Consent to Services:   Maria Jacobson was given information about Care Management services today including:  Care Management services includes personalized support from designated clinical staff supervised by her physician, including individualized plan of care and coordination with other care providers 24/7 contact phone numbers for assistance for urgent and routine care needs. The patient may stop case management services at any time by phone call to the office staff.  Patient agreed to services and consent obtained.   Assessment: Review of patient past medical history, allergies, medications, health status, including review of consultants reports, laboratory and other test data, was performed as part of comprehensive evaluation and provision of chronic care management services.   SDOH (Social Determinants of Health) assessments and interventions performed:    Care Plan  Allergies  Allergen Reactions   Drug Ingredient [Black Walnut Pollen Allergy Skin Test]    Hazelnut ([place]) Allergy Skin Test    Pecan Extract Allergy Skin Test    Shellfish Allergy Hives    Outpatient Encounter Medications as of 04/19/2021  Medication Sig Note   ABILIFY MAINTENA 400 MG PRSY INTRAMUSCULAR EVERY 4 WEEKS    acetaminophen (TYLENOL) 500 MG tablet Take 1,000 mg by mouth every 4 (four) hours as needed for headache.    albuterol (VENTOLIN HFA) 108 (90 Base) MCG/ACT inhaler INHALE 2 PUFFS INTO LUNGS EVERY 4 HOURS AS NEEDED FOR WHEEZING OR  SHORTNESS OF BREATH (COUGH).    ALPRAZolam (XANAX) 1 MG tablet Take 1 mg by mouth at bedtime as needed for anxiety.    amphetamine-dextroamphetamine (ADDERALL) 30 MG tablet Take 30 mg by mouth daily. 12/04/2019: QAM   ARIPiprazole (ABILIFY) 15 MG tablet Take 15 mg by mouth daily.    clonazePAM (KLONOPIN) 0.5 MG tablet Take 0.5 mg by mouth 5 (five) times daily.    cyclobenzaprine (FLEXERIL) 10 MG tablet Take 1 tablet (10 mg total) by mouth 3 (three) times daily as needed for muscle spasms.    fexofenadine (ALLEGRA) 180 MG tablet Take 180 mg by mouth daily as needed for allergies or rhinitis.    fluconazole (DIFLUCAN) 150 MG tablet Take one tablet by mouth on Day 1. Repeat dose 2nd tablet on Day 3.    furosemide (LASIX) 20 MG tablet TAKE 1 TABLET BY MOUTH EVERY DAY FOR SWELLING, MAY INCREASE TO 2 TABLETS IF NEEDED FOR MAX 7 DAYS PER FLARE    lamoTRIgine (LAMICTAL) 100 MG tablet Take 200 mg by mouth 2 (two) times daily. Taking 200 mg (2 tablets) each morning and 150 mg (1.5 tablets) each evening    melatonin 3 MG TABS tablet Take 3 mg by mouth at bedtime.    meloxicam (MOBIC) 15 MG tablet TAKE ONE TABLET BY MOUTH DAILY    omeprazole (PRILOSEC) 20 MG capsule Take 1 capsule (20 mg total) by mouth daily as needed.    sulfamethoxazole-trimethoprim (BACTRIM DS) 800-160 MG tablet Take 1 tablet by mouth 2 (two) times daily.    Triamcinolone Acetonide (NASACORT ALLERGY 24HR NA) Place into the nose daily as needed.  No facility-administered encounter medications on file as of 04/19/2021.    Patient Active Problem List   Diagnosis Date Noted   Abnormal urine 05/01/2020   Abnormal urine odor 05/01/2020   Fatigue 05/01/2020   GERD (gastroesophageal reflux disease) 09/25/2019   Epidermal cyst of neck 06/13/2017   Atypical mole 10/05/2016   Bilateral lower extremity edema 06/15/2016   Pain and swelling of left lower leg 06/15/2016   Pain in joint, multiple sites 06/15/2016   Family history of rheumatoid  arthritis 06/15/2016   Chronic bilateral low back pain with right-sided sciatica 05/24/2016   Left medial knee pain 05/23/2016   Anxiety 05/23/2016   Bipolar affective disorder, depressed, severe ([place]) 05/23/2016   Morbid obesity with BMI of 50.0-59.9, adult ([place]) 05/23/2016   Hyperlipidemia 05/23/2016    Conditions to be addressed/monitored: HLD, Anxiety, Depression, Bipolar Disorder, and Chronic pain  Care Plan : RNCM: Adult General plan of care: Chronic pain, anxiety, depression, bipolar and HLD  Updates made by Maria Ingles, RN since 04/19/2021 12:00 AM     Problem: RNCM: Adult General plan of care: Chronic pain, anxiety, depression, bipolar and HLD   Priority: High  Onset Date: 03/18/2021     Long-Range Goal: RNCM: Adult General plan of care: Chronic pain, anxiety, depression, bipolar and HLD   Start Date: 03/18/2021  Expected End Date: 03/18/2022  Recent Progress: On track  Priority: High  Note:   Current Barriers:  Knowledge Deficits related to plan of care for management of HLD, Anxiety with Excessive Worry, Social Anxiety,, Depression: depressed mood, fatigue, hopelessness, anxiety, loss of energy/fatigue,, and Bipolar Disorder, and chronic pain   Care Coordination needs related to Financial constraints related to the ability to afford reliable and consistent healthcare for chronic conditions, Level of care concerns, and Mental Health Concerns   Chronic Disease Management support and education needs related to HLD, Anxiety with Excessive Worry, Social Anxiety,, Depression: depressed mood, fatigue, hopelessness, anxiety, loss of energy/fatigue,, and Bipolar Disorder, and chronic pain  Financial Constraints.  Non-adherence to scheduled provider appointments  RNCM Clinical Goal(s):  Patient will verbalize understanding of plan for management of HLD, Anxiety, Depression, Bipolar Disorder, and chronic pain  verbalize basic understanding of HLD, Anxiety, Depression,  Bipolar Disorder, and chronic pain  disease process and self health management plan by working with the CCM team to aide and assist in finding affordable health care to meet health and wellness goals take all medications exactly as prescribed and will call provider for medication related questions demonstrate understanding of rationale for each prescribed medication and take as directed  demonstrate improved and ongoing adherence to prescribed treatment plan for HLD, Anxiety, Depression, Bipolar Disorder, and chronic pain  as evidenced by adherence to prescribed medication regimen contacting provider for new or worsened symptoms or questions and obtaining charity care of CAFA to be able to help with chronic health conditions   demonstrate improved and ongoing health management independence by effective management of chronic conditions  continue to work with RN Care Manager to address care management and care coordination needs related to HLD, Anxiety, Depression, Bipolar Disorder, and chronic pain  work with Education officer, museum to address Financial constraints related to health care cost , Level of care concerns, and Mental Health Concerns  related to the management of HLD, Anxiety, Depression, Bipolar Disorder, and chronic pain  demonstrate a decrease in HLD, Anxiety, Depression, Bipolar Disorder, and chronic pain  exacerbations   demonstrate ongoing self health care management ability by effective  management of chronic conditions  through collaboration with RN Care manager, provider, and care team.   Interventions: 1:1 collaboration with primary care provider regarding development and update of comprehensive plan of care as evidenced by provider attestation and co-signature Inter-disciplinary care team collaboration (see longitudinal plan of care) Evaluation of current treatment plan related to  self management and patient's adherence to plan as established by provider   SDOH Barriers (Status: New goal.  Goal on track: YES.)  Patient interviewed and SDOH assessment performed        SDOH Interventions    Flowsheet Row Most Recent Value  SDOH Interventions   Physical Activity Interventions Other (Comments)  [limited mobility due to chronic pain]  Stress Interventions Other (Comment)  [has had a hard year but is ready to take ownership of her health now]  Social Connections Interventions Other (Comment)  [has great support from Maria Jacobson her significant other]  Depression Interventions/Treatment  --  Maria Jacobson to work with CCM team, sees psychiatry regularly, great support system]     Patient interviewed and appropriate assessments performed Provided patient with information about [facility] care and CAFA, the patient has completed charity care paperwork and mailed recently, waiting to hear back from the approval process. 04-19-2021: Provided the patient with information about a letter being ready for pick up at the pcp office for the patient.  Discussed plans with patient for ongoing care management follow up and provided patient with direct contact information for care management team Advised patient to work with CCM team, look at [facility] paperwork, and look for charity care information Collaborated with RN Case Manager re: effective management of chronic conditions and help with obtaining needed healthcare for chronic conditions.  Provided education to patient/caregiver regarding level of care options.    Anxiety, depression and bipolar  (Status: New goal. Goal on track: YES.) Evaluation of current treatment plan related to Anxiety, Depression, and Bipolar Disorder, Financial constraints related to affordable healthcare , Level of care concerns, and Mental Health Concerns  self-management and patient's adherence to plan as established by provider. Discussed plans with patient for ongoing care management follow up and provided patient with direct contact information for care management team Advised patient  to look at [facility] paperwork and see if this is something she feels she would benefit from ; Provided education to patient re: CAFA and charity care and working with the CCM team to meet mental health needs and other chronic conditions management ; Reviewed medications with patient and discussed complinace. The patient went to [facility] today to pick up psychiatric medications. ; Collaborated with LCSW and pcp regarding patient completing charity care paperwork; Provided patient with CAFA and charity care educational materials related to assistance with meeting healthcare needs and receiveing care for chronic conditions; Social Work referral for education and support for anxiety, depression, and bipolar disorder; Discussed plans with patient for ongoing care management follow up and provided patient with direct contact information for care management team; Screening for signs and symptoms of depression related to chronic disease state;  Assessed social determinant of health barriers;  Collaboration with the pcp after receiving information from the patient that she needed a letter documenting that she was physically unable to work by the pcp. The pcp has written the letter for the patient. Signed the letter and it is ready for pick up by the patient at the office. The patient corresponded with the RNCM through secure texting. The patient is very grateful for team support and collaboration  in getting needed documents for the patient to have to turn in so that she may receive food stamps. Will continue to monitor for changes.   Health Maintenance (Status: New goal. Goal on track: YES.)  Patient interviewed about adult health maintenance status including Depression screen    Chronic pain management and support   Advised patient to discuss Depression screen    Chronic pain management  with primary care provider  Provided education about working with the CCM team for ongoing support and education for  chronic conditions impacting the patients care      Hyperlipidemia:  (Status: New goal. Goal on track: YES.) Lab Results  Component Value Date   CHOL 233 (H) 10/28/2019   HDL 50 10/28/2019   LDLCALC 153 (H) 10/28/2019   TRIG 165 (H) 10/28/2019   CHOLHDL 4.7 10/28/2019     Medication review performed; medication list updated in electronic medical record.  Provider established cholesterol goals reviewed; Counseled on importance of regular laboratory monitoring as prescribed; Provided HLD educational materials; Reviewed role and benefits of statin for ASCVD risk reduction; Reviewed importance of limiting foods high in cholesterol;  Pain:  (Status: New goal.) Pain assessment performed Medications reviewed Reviewed provider established plan for pain management; Discussed importance of adherence to all scheduled medical appointments; Counseled on the importance of reporting any/all new or changed pain symptoms or management strategies to pain management provider; Advised patient to report to care team affect of pain on daily activities; Discussed use of relaxation techniques and/or diversional activities to assist with pain reduction (distraction, imagery, relaxation, massage, acupressure, TENS, heat, and cold application; Reviewed with patient prescribed pharmacological and nonpharmacological pain relief strategies; Advised patient to discuss referral for assessment and treatment options for chronic pain and discomfort  with provider;  Patient Goals/Self-Care Activities: Patient will self administer medications as prescribed Patient will attend all scheduled provider appointments Patient will call pharmacy for medication refills Patient will attend church or other social activities Patient will continue to perform ADL's independently Patient will continue to perform IADL's independently Patient will call provider office for new concerns or questions Patient will work with BSW to  address care coordination needs and will continue to work with the clinical team to address health care and disease management related needs.   Patient will receive needed healthcare for chronic conditions and health and wellness maintainence       Plan: Telephone follow up appointment with care management team member scheduled for:  04-29-2021  Noreene Larsson RN, MSN, [place] Mobile: [phone]

## 2021-04-20 ENCOUNTER — Other Ambulatory Visit: Payer: Self-pay | Admitting: Family Medicine

## 2021-04-29 ENCOUNTER — Telehealth: Payer: Self-pay | Admitting: General Practice

## 2021-04-29 ENCOUNTER — Ambulatory Visit: Payer: Self-pay

## 2021-04-29 DIAGNOSIS — F419 Anxiety disorder, unspecified: Secondary | ICD-10-CM

## 2021-04-29 DIAGNOSIS — M25562 Pain in left knee: Secondary | ICD-10-CM

## 2021-04-29 DIAGNOSIS — M2550 Pain in unspecified joint: Secondary | ICD-10-CM

## 2021-04-29 DIAGNOSIS — F314 Bipolar disorder, current episode depressed, severe, without psychotic features: Secondary | ICD-10-CM

## 2021-04-29 DIAGNOSIS — G8929 Other chronic pain: Secondary | ICD-10-CM

## 2021-04-29 DIAGNOSIS — E782 Mixed hyperlipidemia: Secondary | ICD-10-CM

## 2021-04-29 NOTE — Patient Instructions (Signed)
Visit Information   Goals Addressed             This Visit's Progress    RNCM: Cope with Chronic Pain       Timeframe:  Long-Range Goal Priority:  High Start Date:     03-18-2021                        Expected End Date:     03-18-2022                  Follow Up Date 06/17/2021    - join a support group - learn how to meditate - learn relaxation techniques - practice acceptance of chronic pain - practice relaxation or meditation daily - spend time with positive people - tell myself I can (not I can't) - think of new ways to do favorite things - use distraction techniques    Why is this important?   Stress makes chronic pain feel worse.  Feelings like depression, anxiety, stress and anger can make your body more sensitive to pain.  Learning ways to cope with stress or depression may help you find some relief from the pain.     Notes: 03-18-2021: Has worked with [facility] in the past. Has lots of issues with chronic pain. The patient has completed her paperwork for [facility] care and has mailed it off. Also sent paperwork for the patient today for CAFA- application for help in the [facility] health system. The patient is ready to feel better and take ownership of her health and well being. 04-29-2021: The patient states she has heard back from [facility] and they needed some more information and she is in the process of completing this now. She is hopeful that she will be accepted. The patient states that her pain is getting worse and she knows she needs to address this. She was taking Tylenol 3 times a day but is using a CBD water soluble mixture that is helpful and a CBD oil tincture that has been very helpful with pain relief. She states that she wants to get better and be more active. Discussed safety concerns and the patient states that [name] is a huge support for her and assist her especially when she is going upstairs to the restroom. Denies any acute distress. Will continue to monitor.       RNCM: Manage My Emotions       Timeframe:  Long-Range Goal Priority:  High Start Date:     03-18-2021                        Expected End Date:    03-18-2022                   Follow Up Date 06/17/2021    - call and visit an old friend - join a support group - laugh; watch a funny movie or comedian - learn and use visualization or guided imagery - perform a random act of kindness - practice relaxation or meditation daily - start or continue a personal journal - talk about feelings with a friend, family or spiritual advisor - practice positive thinking and self-talk    Why is this important?   When you are stressed, down or upset, your body reacts too.  For example, your blood pressure may get higher; you may have a headache or stomachache.  When your emotions get the best of you, your  body's ability to fight off cold and flu gets weak.  These steps will help you manage your emotions.     Notes: 03-18-2021: The patient had been out of contact with the CCM team. Call to the patient today and the patient agreed to talk with [facility] and work with the team. The patient has submitted her paperwork for [facility]. This is a huge accomplishment for her. She talked about how she has been in a hard and touch place for a very long time but is feeling better now and wants to see what she can do to improve her health and well being. Celebrated victory with the patient. The patient states today has been a really good day and she was actually looking forward to going out with [name] to eat tonight. She went to [facility] this morning to get her psychiatric medications. Will continue to monitor. 04-19-2021: The patient called and ask for help with getting a letter from the pcp stating that she was unable to physically work so she could supply this to receive food stamps. In basket messaging sent to the pcp and the pcp has completed the letter and it is reach for pick up at the MD office. The patient back and a  VM was left. Did send a secure text letting the patient know the letter was ready for pick up. Will continue to monitor for changes and new needs. 04-29-2021: The patient has food stamps now and she is excited today as she and [name] are going to [place] to see her son, Pierre Bali.  He is a Equities trader and in the march band as a drum major and he is also on the homecoming court. She states she is very proud of him. She is remaining upbeat as she waits to hear back from charity care. She knows she has the support of the CCM team and pcp. She wants to get better and has made steps to improve her  health and well being.         Patient verbalizes understanding of instructions provided today and agrees to view in [place].   Telephone follow up appointment with care management team member scheduled for: 06-17-2021 at [place] am  Noreene Larsson RN, MSN, [place] Mobile: [phone]

## 2021-04-29 NOTE — Chronic Care Management (AMB) (Signed)
Care Management    RN Visit Note  04/29/2021 Name: Maria Jacobson MRN: [id number] DOB: 09-25-1975  Subjective: Maria Jacobson is a 45 y.o. year old female who is a primary care patient of [name], DO. The care management team was consulted for assistance with disease management and care coordination needs.    Engaged with patient by telephone for follow up visit in response to provider referral for case management and/or care coordination services.   Consent to Services:   Maria Jacobson was given information about Care Management services today including:  Care Management services includes personalized support from designated clinical staff supervised by her physician, including individualized plan of care and coordination with other care providers 24/7 contact phone numbers for assistance for urgent and routine care needs. The patient may stop case management services at any time by phone call to the office staff.  Patient agreed to services and consent obtained.   Assessment: Review of patient past medical history, allergies, medications, health status, including review of consultants reports, laboratory and other test data, was performed as part of comprehensive evaluation and provision of chronic care management services.   SDOH (Social Determinants of Health) assessments and interventions performed:    Care Plan  Allergies  Allergen Reactions   Drug Ingredient [Black Walnut Pollen Allergy Skin Test]    Hazelnut (Filbert) Allergy Skin Test    Pecan Extract Allergy Skin Test    Shellfish Allergy Hives    Outpatient Encounter Medications as of 04/29/2021  Medication Sig Note   ABILIFY MAINTENA 400 MG PRSY INTRAMUSCULAR EVERY 4 WEEKS    acetaminophen (TYLENOL) 500 MG tablet Take 1,000 mg by mouth every 4 (four) hours as needed for headache.    albuterol (VENTOLIN HFA) 108 (90 Base) MCG/ACT inhaler INHALE 2 PUFFS INTO LUNGS EVERY 4 HOURS AS NEEDED FOR WHEEZING OR  SHORTNESS OF BREATH (COUGH).    ALPRAZolam (XANAX) 1 MG tablet Take 1 mg by mouth at bedtime as needed for anxiety.    amphetamine-dextroamphetamine (ADDERALL) 30 MG tablet Take 30 mg by mouth daily. 12/04/2019: QAM   ARIPiprazole (ABILIFY) 15 MG tablet Take 15 mg by mouth daily.    clonazePAM (KLONOPIN) 0.5 MG tablet Take 0.5 mg by mouth 5 (five) times daily.    cyclobenzaprine (FLEXERIL) 10 MG tablet Take 1 tablet (10 mg total) by mouth 3 (three) times daily as needed for muscle spasms.    fexofenadine (ALLEGRA) 180 MG tablet Take 180 mg by mouth daily as needed for allergies or rhinitis.    fluconazole (DIFLUCAN) 150 MG tablet Take one tablet by mouth on Day 1. Repeat dose 2nd tablet on Day 3.    furosemide (LASIX) 20 MG tablet TAKE 1 TABLET BY MOUTH EVERY DAY FOR SWELLING, MAY INCREASE TO 2 TABLETS IF NEEDED FOR MAX 7 DAYS PER FLARE    lamoTRIgine (LAMICTAL) 100 MG tablet Take 200 mg by mouth 2 (two) times daily. Taking 200 mg (2 tablets) each morning and 150 mg (1.5 tablets) each evening    melatonin 3 MG TABS tablet Take 3 mg by mouth at bedtime.    meloxicam (MOBIC) 15 MG tablet TAKE ONE TABLET BY MOUTH DAILY    omeprazole (PRILOSEC) 20 MG capsule Take 1 capsule (20 mg total) by mouth daily as needed.    sulfamethoxazole-trimethoprim (BACTRIM DS) 800-160 MG tablet Take 1 tablet by mouth 2 (two) times daily.    Triamcinolone Acetonide (NASACORT ALLERGY 24HR NA) Place into the nose daily as needed.  No facility-administered encounter medications on file as of 04/29/2021.    Patient Active Problem List   Diagnosis Date Noted   Abnormal urine 05/01/2020   Abnormal urine odor 05/01/2020   Fatigue 05/01/2020   GERD (gastroesophageal reflux disease) 09/25/2019   Epidermal cyst of neck 06/13/2017   Atypical mole 10/05/2016   Bilateral lower extremity edema 06/15/2016   Pain and swelling of left lower leg 06/15/2016   Pain in joint, multiple sites 06/15/2016   Family history of rheumatoid  arthritis 06/15/2016   Chronic bilateral low back pain with right-sided sciatica 05/24/2016   Left medial knee pain 05/23/2016   Anxiety 05/23/2016   Bipolar affective disorder, depressed, severe ([place]) 05/23/2016   Morbid obesity with BMI of 50.0-59.9, adult ([place]) 05/23/2016   Hyperlipidemia 05/23/2016    Conditions to be addressed/monitored: HLD, Anxiety, Depression, Bipolar Disorder, and chronic pain   Care Plan : RNCM: Adult General plan of care: Chronic pain, anxiety, depression, bipolar and HLD  Updates made by Vanita Ingles, RN since 04/29/2021 12:00 AM     Problem: RNCM: Adult General plan of care: Chronic pain, anxiety, depression, bipolar and HLD   Priority: High  Onset Date: 03/18/2021     Long-Range Goal: RNCM: Adult General plan of care: Chronic pain, anxiety, depression, bipolar and HLD   Start Date: 03/18/2021  Expected End Date: 03/18/2022  This Visit's Progress: On track  Recent Progress: On track  Priority: High  Note:   Current Barriers:  Knowledge Deficits related to plan of care for management of HLD, Anxiety with Excessive Worry, Social Anxiety,, Depression: depressed mood, fatigue, hopelessness, anxiety, loss of energy/fatigue,, and Bipolar Disorder, and chronic pain   Care Coordination needs related to Financial constraints related to the ability to afford reliable and consistent healthcare for chronic conditions, Level of care concerns, and Mental Health Concerns   Chronic Disease Management support and education needs related to HLD, Anxiety with Excessive Worry, Social Anxiety,, Depression: depressed mood, fatigue, hopelessness, anxiety, loss of energy/fatigue,, and Bipolar Disorder, and chronic pain  Financial Constraints.  Non-adherence to scheduled provider appointments  RNCM Clinical Goal(s):  Patient will verbalize understanding of plan for management of HLD, Anxiety, Depression, Bipolar Disorder, and chronic pain  verbalize basic  understanding of HLD, Anxiety, Depression, Bipolar Disorder, and chronic pain  disease process and self health management plan by working with the CCM team to aide and assist in finding affordable health care to meet health and wellness goals take all medications exactly as prescribed and will call provider for medication related questions demonstrate understanding of rationale for each prescribed medication and take as directed  demonstrate improved and ongoing adherence to prescribed treatment plan for HLD, Anxiety, Depression, Bipolar Disorder, and chronic pain  as evidenced by adherence to prescribed medication regimen contacting provider for new or worsened symptoms or questions and obtaining charity care of [facility] to be able to help with chronic health conditions   demonstrate improved and ongoing health management independence by effective management of chronic conditions  continue to work with RN Care Manager to address care management and care coordination needs related to HLD, Anxiety, Depression, Bipolar Disorder, and chronic pain  work with Education officer, museum to address Financial constraints related to health care cost , Level of care concerns, and Mental Health Concerns  related to the management of HLD, Anxiety, Depression, Bipolar Disorder, and chronic pain  demonstrate a decrease in HLD, Anxiety, Depression, Bipolar Disorder, and chronic pain  exacerbations   demonstrate ongoing  self health care management ability by effective management of chronic conditions  through collaboration with RN Care manager, provider, and care team.   Interventions: 1:1 collaboration with primary care provider regarding development and update of comprehensive plan of care as evidenced by provider attestation and co-signature Inter-disciplinary care team collaboration (see longitudinal plan of care) Evaluation of current treatment plan related to  self management and patient's adherence to plan as established by  provider   SDOH Barriers (Status: Goal on track: YES.)  Patient interviewed and SDOH assessment performed        SDOH Interventions    Flowsheet Row Most Recent Value  SDOH Interventions   Physical Activity Interventions Other (Comments)  [limited mobility due to chronic pain]  Stress Interventions Other (Comment)  [has had a hard year but is ready to take ownership of her health now]  Social Connections Interventions Other (Comment)  [has great support from [place] her significant other]  Depression Interventions/Treatment  --  [name] to work with CCM team, sees psychiatry regularly, great support system]     Patient interviewed and appropriate assessments performed Provided patient with information about [facility] care and CA[facility], the patient has completed charity care paperwork and mailed recently, waiting to hear back from the approval process. 04-19-2021: Provided the patient with information about a letter being ready for pick up at the pcp office for the patient. 04-29-2021: The patient has received information from [facility] and they need additional documents. She feels once she submits the additional documents she will be able to get charity care. Discussed plans with patient for ongoing care management follow up and provided patient with direct contact information for care management team Advised patient to work with CCM team, look at [facility] paperwork, and look for charity care information Collaborated with RN Case Manager re: effective management of chronic conditions and help with obtaining needed healthcare for chronic conditions.  Provided education to patient/caregiver regarding level of care options.    Anxiety, depression and bipolar  (Status: Goal on track: YES.) Evaluation of current treatment plan related to Anxiety, Depression, and Bipolar Disorder, Financial constraints related to affordable healthcare , Level of care concerns, and Mental Health Concerns  self-management and  patient's adherence to plan as established by provider. Discussed plans with patient for ongoing care management follow up and provided patient with direct contact information for care management team Advised patient to look at [facility] paperwork and see if this is something she feels she would benefit from ; Provided education to patient re: CAFA and charity care and working with the CCM team to meet mental health needs and other chronic conditions management ; Reviewed medications with patient and discussed complinace. The patient went to [facility] today to pick up psychiatric medications. 04-29-2021: The patient is compliant with medications and gets her medications from [facility] in [place]. They have been working with her for several years on her medications and effective management ; Collaborated with LCSW and pcp regarding patient completing charity care paperwork. 04-29-2021: Is working with the LCSW for ongoing support, education, and needs; Provided patient with [facility] and charity care educational materials related to assistance with meeting healthcare needs and receiveing care for chronic conditions. 04-29-2021: The patient has heard back from [facility] and needs additional documents. She is working on obtaining those documents to send back to charity care now.  Social Work referral for education and support for anxiety, depression, and bipolar disorder. 04-29-2021: The patient is actively working with the LCSW  for ongoing support, education, and needs Discussed plans with patient for ongoing care management follow up and provided patient with direct contact information for care management team; Screening for signs and symptoms of depression related to chronic disease state. 04-29-2021: The patient is upbeat and staying positive. She states she and [name] are going to [employer] to see her son Pierre Bali who is a Equities trader and a drum major in the marching band. He is also on the homecoming court and may be king of  the court. She is excited and proud of him. She wants to be healthy for herself, and her family;  Assessed social determinant of health barriers;  Collaboration with the pcp after receiving information from the patient that she needed a letter documenting that she was physically unable to work by the pcp. The pcp has written the letter for the patient. Signed the letter and it is ready for pick up by the patient at the office. The patient corresponded with the RNCM through secure texting. The patient is very grateful for team support and collaboration in getting needed documents for the patient to have to turn in so that she may receive food stamps. Will continue to monitor for changes. 04-29-2021: The patient has received help with food stamps and the needed documents. The patient is thankful for the support of the CCM team and pcp. Knows to contact the [facility] or LCSW for any new concerns or needs that may arise. Will continue to monitor for changes.   Health Maintenance (Status: Goal on track: YES.)  Patient interviewed about adult health maintenance status including Depression screen    Chronic pain management and support   Advised patient to discuss Depression screen    Chronic pain management  with primary care provider  Provided education about working with the CCM team for ongoing support and education for chronic conditions impacting the patients care      Hyperlipidemia:  (Status: Goal on track: YES.) Lab Results  Component Value Date   CHOL 233 (H) 10/28/2019   HDL 50 10/28/2019   LDLCALC 153 (H) 10/28/2019   TRIG 165 (H) 10/28/2019   CHOLHDL 4.7 10/28/2019  04-29-2021: The patient has new lab work from outside source that she is going to supply to the office for records. States her cholesterol level is elevated. She is working on dietary changes.    Medication review performed; medication list updated in electronic medical record.  Provider established cholesterol goals reviewed.  04-29-2021: Review of cholesterol levels. The patient verbalized she has high levels. Will send paperwork from where she had blood work recently for medication management  Counseled on importance of regular laboratory monitoring as prescribed. 04-29-2021: Had recent lab work from outside source. Will send a copy to the office for records; Provided HLD educational materials. 04-29-2021: Discussed healthy eating options and sent healthy eating booklet by email to the patient. She and [name] have changed their eating habits and are monitoring their dietary habits; Reviewed role and benefits of statin for ASCVD risk reduction; Reviewed importance of limiting foods high in cholesterol;  Pain:  (Status: Goal on track: YES.) Pain assessment performed. 04-29-2021: States her pain is getting worse but she is managing well. She has things in place and can get more care when she is approved for charity care. She states that this will be a big help for her. She is completing documents now.  Medications reviewed. 04-29-2021: The patient takes Tylenol once a day. Was taking it 3 times  a day but has found a water soluble CBD drink and a CBD oil tincture that has been effective in helping with her pain control. She can tell a positive difference since using this product.  Reviewed provider established plan for pain management. 04-29-2021: Review of safety and fall prevention. The patient denies any falls. States she is being careful and as an extra measure she has [name] go with her when she has to go upstairs to the bathroom. She is positive and hopeful that soon she will hear from charity care.  Discussed importance of adherence to all scheduled medical appointments; Counseled on the importance of reporting any/all new or changed pain symptoms or management strategies to pain management provider; Advised patient to report to care team affect of pain on daily activities; Discussed use of relaxation techniques and/or diversional  activities to assist with pain reduction (distraction, imagery, relaxation, massage, acupressure, TENS, heat, and cold application; Reviewed with patient prescribed pharmacological and nonpharmacological pain relief strategies. 04-29-2021: Discussed with the patient pain relief measures.  Advised patient to discuss referral for assessment and treatment options for chronic pain and discomfort  with provider;  Patient Goals/Self-Care Activities: Patient will self administer medications as prescribed Patient will attend all scheduled provider appointments Patient will call pharmacy for medication refills Patient will attend church or other social activities Patient will continue to perform ADL's independently Patient will continue to perform IADL's independently Patient will call provider office for new concerns or questions Patient will work with BSW to address care coordination needs and will continue to work with the clinical team to address health care and disease management related needs.   Patient will receive needed healthcare for chronic conditions and health and wellness maintainence       Plan: Telephone follow up appointment with care management team member scheduled for:  06-17-2021 at [place] am  Noreene Larsson RN, MSN, [place] Mobile: [phone]

## 2021-05-17 ENCOUNTER — Other Ambulatory Visit: Payer: Self-pay | Admitting: Family Medicine

## 2021-05-17 DIAGNOSIS — J9801 Acute bronchospasm: Secondary | ICD-10-CM

## 2021-05-18 NOTE — Telephone Encounter (Signed)
Requested Prescriptions  Pending Prescriptions Disp Refills  . albuterol (VENTOLIN HFA) 108 (90 Base) MCG/ACT inhaler [Pharmacy Med Name: ALBUTEROL HFA 90 MCG INHALER] 8.5 g 0    Sig: INHALE TWO PUFFS BY MOUTH EVERY 4 HOURS AS NEEDED FOR WHEEZING OR FOR SHORTNESS OF BREATH OR FOR COUGH     Pulmonology:  Beta Agonists Failed - 05/17/2021  7:22 PM      Failed - One inhaler should last at least one month. If the patient is requesting refills earlier, contact the patient to check for uncontrolled symptoms.      Passed - Valid encounter within last 12 months    Recent Outpatient Visits          5 months ago Acute cystitis with hematuria   [place], DO   10 months ago Chronic pain of both knees   [place], DO   1 year ago Abnormal urine odor   [place], FNP   1 year ago Morbid obesity with BMI of 50.0-59.9, adult [facility])   [employer], DO   1 year ago Foul smelling urine   [place], Devonne Doughty, [place]

## 2021-05-19 ENCOUNTER — Ambulatory Visit: Payer: Self-pay | Admitting: Licensed Clinical Social Worker

## 2021-05-19 NOTE — Chronic Care Management (AMB) (Signed)
    Clinical Social Work  Care Management   Phone Outreach    05/19/2021 Name: Dyesha Henault MRN: [id number] DOB: 03/06/1976  Tamre Cass is a 45 y.o. year old female who is a primary care patient of [name], DO .   Reason for referral: [place] and Resources.    F/U phone call today to assess needs, progress and barriers with care plan goals.   Unable to keep phone appointment today and requested to reschedule.  Plan:Appointment was rescheduled with CCM LCSW  Review of patient status, including review of consultants reports, relevant laboratory and other test results, and collaboration with appropriate care team members and the patient's provider was performed as part of comprehensive patient evaluation and provision of care management services.    Christa See, MSW, [facility] Care Management [email or web address] Phone [phone] 9:18 AM

## 2021-05-26 ENCOUNTER — Ambulatory Visit: Payer: Self-pay | Admitting: Licensed Clinical Social Worker

## 2021-05-26 DIAGNOSIS — F419 Anxiety disorder, unspecified: Secondary | ICD-10-CM

## 2021-05-26 DIAGNOSIS — G8929 Other chronic pain: Secondary | ICD-10-CM

## 2021-05-26 DIAGNOSIS — F314 Bipolar disorder, current episode depressed, severe, without psychotic features: Secondary | ICD-10-CM

## 2021-05-26 DIAGNOSIS — M5441 Lumbago with sciatica, right side: Secondary | ICD-10-CM

## 2021-05-26 DIAGNOSIS — E782 Mixed hyperlipidemia: Secondary | ICD-10-CM

## 2021-05-30 NOTE — Chronic Care Management (AMB) (Signed)
Care Management Clinical Social Work Note  05/30/2021 Name: Maria Jacobson MRN: [id number] DOB: 1976/06/21  Maria Jacobson is a 45 y.o. year old female who is a primary care patient of [name], DO.  The Care Management team was consulted for assistance with chronic disease management and coordination needs.  Engaged with patient by telephone for follow up visit in response to provider referral for social work chronic care management and care coordination services  Consent to Services:  Ms. Duross was given information about Care Management services today including:  Care Management services includes personalized support from designated clinical staff supervised by her physician, including individualized plan of care and coordination with other care providers 24/7 contact phone numbers for assistance for urgent and routine care needs. The patient may stop case management services at any time by phone call to the office staff.  Patient agreed to services and consent obtained.   Consent to Services:  The patient was given information about Care Management services, agreed to services, and gave verbal consent prior to initiation of services.  Please see initial visit note for detailed documentation.   Patient agreed to services today and consent obtained.  Engaged with patient by phone in response to provider referral for social work care coordination services:  Assessment/Interventions:  Patient continues to maintain positive progress with care plan goals. She continues to participate in med management through [facility]. CCM LCSW discussed resources to assist with obtaining local counseling services.   See Care Plan below for interventions and patient self-care activities.  Recent life changes or stressors: Management of health conditions  Recommendation: Patient may benefit from, and is in agreement work with LCSW to address care coordination needs and will continue to work  with the clinical team to address health care and disease management related needs.   Follow up Plan: Patient would like continued follow-up from CCM LCSW .  per patient's request will follow up in 08/10/21.  Will call office if needed prior to next encounter.  SDOH (Social Determinants of Health) assessments and interventions performed:  NA  Advanced Directives Status: Not addressed in this encounter.  Care Plan  Allergies  Allergen Reactions   Drug Ingredient [Black Walnut Pollen Allergy Skin Test]    Hazelnut [name]) Allergy Skin Test    Pecan Extract Allergy Skin Test    Shellfish Allergy Hives    Outpatient Encounter Medications as of 05/26/2021  Medication Sig Note   ABILIFY MAINTENA 400 MG PRSY INTRAMUSCULAR EVERY 4 WEEKS    acetaminophen (TYLENOL) 500 MG tablet Take 1,000 mg by mouth every 4 (four) hours as needed for headache.    albuterol (VENTOLIN HFA) 108 (90 Base) MCG/ACT inhaler INHALE TWO PUFFS BY MOUTH EVERY 4 HOURS AS NEEDED FOR WHEEZING OR FOR SHORTNESS OF BREATH OR FOR COUGH    ALPRAZolam (XANAX) 1 MG tablet Take 1 mg by mouth at bedtime as needed for anxiety.    amphetamine-dextroamphetamine (ADDERALL) 30 MG tablet Take 30 mg by mouth daily. 12/04/2019: QAM   ARIPiprazole (ABILIFY) 15 MG tablet Take 15 mg by mouth daily.    clonazePAM (KLONOPIN) 0.5 MG tablet Take 0.5 mg by mouth 5 (five) times daily.    cyclobenzaprine (FLEXERIL) 10 MG tablet Take 1 tablet (10 mg total) by mouth 3 (three) times daily as needed for muscle spasms.    fexofenadine (ALLEGRA) 180 MG tablet Take 180 mg by mouth daily as needed for allergies or rhinitis.    fluconazole (DIFLUCAN) 150 MG tablet Take  one tablet by mouth on Day 1. Repeat dose 2nd tablet on Day 3.    furosemide (LASIX) 20 MG tablet TAKE 1 TABLET BY MOUTH EVERY DAY FOR SWELLING, MAY INCREASE TO 2 TABLETS IF NEEDED FOR MAX 7 DAYS PER FLARE    lamoTRIgine (LAMICTAL) 100 MG tablet Take 200 mg by mouth 2 (two) times daily. Taking  200 mg (2 tablets) each morning and 150 mg (1.5 tablets) each evening    melatonin 3 MG TABS tablet Take 3 mg by mouth at bedtime.    meloxicam (MOBIC) 15 MG tablet TAKE ONE TABLET BY MOUTH DAILY    omeprazole (PRILOSEC) 20 MG capsule Take 1 capsule (20 mg total) by mouth daily as needed.    sulfamethoxazole-trimethoprim (BACTRIM DS) 800-160 MG tablet Take 1 tablet by mouth 2 (two) times daily.    Triamcinolone Acetonide (NASACORT ALLERGY 24HR NA) Place into the nose daily as needed.    No facility-administered encounter medications on file as of 05/26/2021.    Patient Active Problem List   Diagnosis Date Noted   Abnormal urine 05/01/2020   Abnormal urine odor 05/01/2020   Fatigue 05/01/2020   GERD (gastroesophageal reflux disease) 09/25/2019   Epidermal cyst of neck 06/13/2017   Atypical mole 10/05/2016   Bilateral lower extremity edema 06/15/2016   Pain and swelling of left lower leg 06/15/2016   Pain in joint, multiple sites 06/15/2016   Family history of rheumatoid arthritis 06/15/2016   Chronic bilateral low back pain with right-sided sciatica 05/24/2016   Left medial knee pain 05/23/2016   Anxiety 05/23/2016   Bipolar affective disorder, depressed, severe ([place]) 05/23/2016   Morbid obesity with BMI of 50.0-59.9, adult ([place]) 05/23/2016   Hyperlipidemia 05/23/2016    Conditions to be addressed/monitored: Anxiety and Bipolar Disorder; Mental Health Concerns   Care Plan : General Social Work (Adult)  Updates made by Rebekah Chesterfield, LCSW since 05/30/2021 12:00 AM     Problem: Coping Skills (General Plan of Care)      Goal: Coping Skills Enhanced   Start Date: 03/31/2021  This Visit's Progress: On track  Recent Progress: On track  Priority: High  Note:   Current barriers:   Severe Persistent Mental Health needs related to Bipolar Disorder and Anxiety Financial constraints related to medical coverage Needs Support, Education, and Care Coordination in order to meet unmet  mental health needs. Clinical Goal(s): demonstrate a reduction in symptoms related to :Anxiety  and Bipolar Disorder  explore community resource options for unmet needs related to:No health insurance    Clinical Interventions:  Assessed patient's previous and current treatment, coping skills, support system and barriers to care  Patient was diagnosed with Bipolar and MDD approx. 13 years ago. She reports difficulty managing symptoms of anxiety Patient participates in medication management through [facility] in [facility] (approx. 20 years) Patient enjoys services provided and feels strongly supported. Has an appointment 04/02/21 She uses the public health pharmacy to assist with medication financial assistance 10/26: Patient continues to receive services through [facility]. Patient is at risk of having to discontinue services due [place] of [place]. CCM LCSW provided patient with information on [facility] to assist with obtaining services in [facility] for uninsured patients Patient is interested in establishing therapy to address possible symptoms of PTSD. She is thinking about going to [facility] 10/26: Patient shared that [facility] offered to provide therapy; however, it is too far to participate in sessions twice a month. Patient reports tele-health may trigger anxiety.  Resources for local therapy provided Support system includes fiance of fifteen years, mother and son, Pierre Bali resides in [place] No resource needs. Applied for [employer] Patient agreed to contact [place] to initiate application for disability due to being unable to work for ten years  Patient has reviewed CAFA application and is trying to obtain requirements. CCM LCSW encouraged patient to contact LCSW with any questions or concerns regarding application CCM LCSW collaborated with [employer]. Patient requests a call to schedule an appointment for concerns of an  UTI Review various resources, discussed options and provided patient information about Department of Social Services ( CCM LCSW provided contact number ) Depression screen reviewed , Solution-Focused Strategies, Active listening / Reflection utilized , Emotional Supportive Provided, Participation in counseling encouraged , Verbalization of feelings encouraged , and Suicidal Ideation/Homicidal Ideation assessed: Denies SI/HI  1:1 collaboration with primary care provider regarding development and update of comprehensive plan of care as evidenced by provider attestation and co-signature Inter-disciplinary care team collaboration (see longitudinal plan of care) Patient Goals/Self-Care Activities: Over the next 120 days Attend scheduled appointments with providers Contact clinic with any questions or concerns Call [place] [phone] Follow up on insurance options [facility] Financial Assistance) Continue with compliance of taking medication        Christa See, MSW, [email or web address] Phone [phone] 10:38 PM

## 2021-05-30 NOTE — Patient Instructions (Signed)
Visit Information   Goals Addressed               This Visit's Progress     Patient Stated     COMPLETED: SW: "I lost a close friend recently." (pt-stated)        Current Barriers:  Chronic Mental Health needs related to grief, anxiety, bipolar and depression Mental Health Concerns  Suicidal Ideation/Homicidal Ideation: No  Clinical Social Work Goal(s):   Over the next 120 days, patient will work with SW to address concerns related to care coordination needs and lack of Brewing technologist. LCSW will assist patient in gaining additional support in order to maintain health and mental health appropriately  Over the next 120 days, patient will work with SW bi-monthly by telephone or in person to reduce or manage symptoms related to stress and grief Over the next 120 days, patient will demonstrate improved health management independence as evidenced by implementing healthy self-care skills and positive support/resources into her daily routine to help cope with stressors and improve overall health and well-being  Over the next 120 days, patient or caregiver will verbalize basic understanding of depression/stress process and self health management plan as evidenced by her participation in development of long term plan of care and institution of self health management strategies  Interventions: Patient interviewed and appropriate assessments performed: brief mental health assessment. Patient experienced a recent loss (close friend committed suicide)  Provided patient with information about coping skills and ways to effectively cope with grief LCSW sent secure email to patient with mental health and grief support resources Discussed plans with patient for ongoing care management follow up and provided patient with direct contact information for care management team Advised patient to consider grief therapy and increasing her current socialization  Assisted patient/caregiver  with obtaining information about health plan benefits Provided education and assistance to client regarding Advanced Directives. Provided education to patient/caregiver about Hospice and/or Palliative Care services Referred patient to grief counseling for follow up. Patient reports that she will consider this resource.  Patient reports that "things are much better now." She shares that she has a great group of close friends that also experienced the loss of her friend and she has been able to implement their support into her daily routine to help combat her grief. She shares that they will be getting together next month as well.  Patient reports having all of her medications at this time and was given her Abilify shot at [facility] (in [place]) recently.  Patient has a long standing history of anxiety, depression, and bipolar disorder.. The patient has a psychiatrist but welcomes any support and help in dealing with her conditions. She has a significant other, "[name]", that is very active in her care and a great support to the patient.  Grief Counseling provided throughout entire session.  Patient reports that she had a recent dental procedure completed to remove her molars on 05/26/20. She reports that she is managing this pain well. Patient shares that her fiance is her main support and that he will take her to all of her medical appointments and visit the doctor with her as she is unable to drive. Per patient has been unable to drive for 13 years due to anxiety.  Patient is actively seeing a psychiatrist and denies needing therapy involvement at this time.   Patient Self Care Activities:  Self administers medications as prescribed Attends all scheduled provider appointments Attends church or other social activities Calls provider office  for new concerns or questions Ability for insight Independent living Motivation for treatment Strong family or social support  Patient Coping Strengths:   [place] [employer] Hopefulness Self Advocate Able to Communicate Effectively  Please see past updates related to this goal by clicking on the "Past Updates" button in the selected goal        Other     Find Help in My Community   On track     Timeframe:  Long-Range Goal Priority:  Medium Start Date:       03/31/21                      Expected End Date:   08/31/21                    Follow Up Date 08/10/21   Patient Goals/Self-Care Activities: Over the next 120 days Attend scheduled appointments with providers Contact clinic with any questions or concerns Call [place] [phone] Follow up on insurance options [facility] Financial Assistance) Continue with compliance of taking medication         Patient verbalizes understanding of instructions provided today and agrees to view in [place].   Telephone follow up appointment with care management team member scheduled for:08/10/21  Christa See, MSW, [email or web address] Phone [phone] 10:40 PM

## 2021-06-08 ENCOUNTER — Encounter: Payer: Self-pay | Admitting: Family Medicine

## 2021-06-08 ENCOUNTER — Other Ambulatory Visit: Payer: Self-pay | Admitting: Family Medicine

## 2021-06-08 ENCOUNTER — Ambulatory Visit: Payer: Self-pay | Admitting: Family Medicine

## 2021-06-08 ENCOUNTER — Other Ambulatory Visit: Payer: Self-pay

## 2021-06-08 VITALS — BP 130/90 | HR 111 | Ht 69.0 in | Wt 360.5 lb

## 2021-06-08 DIAGNOSIS — R8290 Unspecified abnormal findings in urine: Secondary | ICD-10-CM

## 2021-06-08 DIAGNOSIS — G8929 Other chronic pain: Secondary | ICD-10-CM

## 2021-06-08 DIAGNOSIS — B379 Candidiasis, unspecified: Secondary | ICD-10-CM

## 2021-06-08 DIAGNOSIS — N3001 Acute cystitis with hematuria: Secondary | ICD-10-CM

## 2021-06-08 DIAGNOSIS — M25562 Pain in left knee: Secondary | ICD-10-CM

## 2021-06-08 DIAGNOSIS — M5441 Lumbago with sciatica, right side: Secondary | ICD-10-CM

## 2021-06-08 DIAGNOSIS — T3695XA Adverse effect of unspecified systemic antibiotic, initial encounter: Secondary | ICD-10-CM

## 2021-06-08 DIAGNOSIS — R600 Localized edema: Secondary | ICD-10-CM

## 2021-06-08 LAB — POCT URINALYSIS DIPSTICK
Bilirubin, UA: NEGATIVE
Glucose, UA: NEGATIVE
Ketones, UA: NEGATIVE
Nitrite, UA: NEGATIVE
Protein, UA: POSITIVE — AB
Spec Grav, UA: 1.01 (ref 1.010–1.025)
Urobilinogen, UA: 0.2 E.U./dL
pH, UA: 7 (ref 5.0–8.0)

## 2021-06-08 MED ORDER — MELOXICAM 15 MG PO TABS: 15.0000 mg | ORAL_TABLET | Freq: Every day | ORAL | 3 refills | Status: AC

## 2021-06-08 MED ORDER — NITROFURANTOIN MONOHYD MACRO 100 MG PO CAPS: 100.0000 mg | ORAL_CAPSULE | Freq: Two times a day (BID) | ORAL | 0 refills | Status: DC

## 2021-06-08 MED ORDER — FUROSEMIDE 20 MG PO TABS: ORAL_TABLET | ORAL | 1 refills | Status: AC

## 2021-06-08 MED ORDER — FLUCONAZOLE 150 MG PO TABS
ORAL_TABLET | ORAL | 1 refills | Status: DC
Start: 2021-06-08 — End: 2021-07-27

## 2021-06-08 MED ORDER — CYCLOBENZAPRINE HCL 10 MG PO TABS: 10.0000 mg | ORAL_TABLET | Freq: Three times a day (TID) | ORAL | 1 refills | Status: DC | PRN

## 2021-06-08 NOTE — Patient Instructions (Addendum)
Thank you for coming to the office today.  Ordered Macrobid twice a day for 7 days Added Diflucan with a refill  If UTI does not completely resolve, can contact back via message/call 7-10 days and we can switch to another.   If resistant we will contact you.  Other longer term med refills were also submitted Mobic, Flexeril, Lasix.  Please schedule a Follow-up Appointment to: Return if symptoms worsen or fail to improve.  If you have any other questions or concerns, please feel free to call the office or send a message through [place]. You may also schedule an earlier appointment if necessary.  Additionally, you may be receiving a survey about your experience at our office within a few days to 1 week by e-mail or mail. We value your feedback.  [name], DO [place]

## 2021-06-08 NOTE — Progress Notes (Signed)
Subjective:    Patient ID: Maria Jacobson, female    DOB: 20-Dec-1975, 45 y.o.   MRN: [id number]  Maria Jacobson is a 45 y.o. female presenting on 06/08/2021 for Hematuria   HPI  UTI, recurrent Last UTI 11/2020, she has had multiple times per year, and has had UTI symptoms persisting for weeks or months. Last time treated Bactrim with good results. Has been on Macrobid in past. 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 Denies any fever chills nausea vomiting  For swelling edema Using leg compression wraps. Taking lasix PRN  General updates She submitted her [facility] application updated corrections on 05/03/21, waiting to hear back now.  Health Maintenance: Due for Flu Shot, declines today despite counseling on benefits   Depression screen [facility] 2/9 06/08/2021 03/18/2021 03/18/2021  Decreased Interest 2 1 1   Down, Depressed, Hopeless 2 1 1   PHQ - 2 Score 4 2 2   Altered sleeping 3 1 -  Tired, decreased energy 2 2 -  Change in appetite 2 2 -  Feeling bad or failure about yourself  3 3 -  Trouble concentrating 3 2 -  Moving slowly or fidgety/restless 0 0 -  Suicidal thoughts 0 0 -  PHQ-9 Score 17 12 -  Difficult doing work/chores Extremely dIfficult Not difficult at all -    Social History   Tobacco Use   Smoking status: Never   Smokeless tobacco: Never  Vaping Use   Vaping Use: Never used  Substance Use Topics   Alcohol use: Not Currently    Comment: rarely   Drug use: No    Review of Systems Per HPI unless specifically indicated above     Objective:    BP 130/90   Pulse (!) 111   Ht 5' 9"  (1.753 m)   Wt (!) 360 lb 8 oz (163.5 kg)   SpO2 96%   BMI 53.24 kg/m   Wt Readings from Last 3 Encounters:  06/08/21 (!) 360 lb 8 oz (163.5 kg)  12/04/20 (!) 354 lb (160.6 kg)  07/13/20 (!) 339 lb 9.6 oz (154 kg)    Physical Exam Vitals and nursing note reviewed.   Constitutional:      General: She is not in acute distress.    Appearance: She is well-developed. She is not diaphoretic.  Skin:    General: Skin is warm and dry.   Results for orders placed or performed in visit on 06/08/21  POCT Urinalysis Dipstick  Result Value Ref Range   Color, UA Yellow    Clarity, UA Clear    Glucose, UA Negative Negative   Bilirubin, UA Negative    Ketones, UA Negative    Spec Grav, UA 1.010 1.010 - 1.025   Blood, UA Moderate ++    pH, UA 7.0 5.0 - 8.0   Protein, UA Positive (A) Negative   Urobilinogen, UA 0.2 0.2 or 1.0 E.U./dL   Nitrite, UA Negative    Leukocytes, UA Trace (A) Negative   Appearance     Odor     COPY OF LAST URINE CULTURE (NOTE - NOT CURRENT RESULT) Urine Culture Order: [id number] Status: Final result   Visible to patient: Yes (seen)   Next appt: 06/17/2021 at 09:45 AM in Internal Medicine [facility] - CMM CASE MANAGER)   Dx: Isaac Bliss smelling urine   Specimen Information: Urine  1 Result Note   1 Patient Communication Component 6 mo ago  MICRO NUMBER: [id number]   SPECIMEN QUALITY: Adequate   Sample Source URINE   STATUS: FINAL   ISOLATE 1: ESBL Escherichia coli Abnormal    Comment: Greater than 100,000 CFU/mL of Escherichia coli (ESBL) ESBL RESULT:        The organism has been confirmed as an ESBL producer.  Resulting Agency [facility]     Susceptibility   Esbl escherichia coli    URINE CULTURE, REFLEX    AMOX/CLAVULANIC 8  Sensitive    AMPICILLIN >=32  Resistant 1    AMPICILLIN/SULBACTAM 16  Intermediate    CEFAZOLIN >=64  Resistant 2    CEFEPIME 2  Resistant    CEFTRIAXONE >=64  Resistant    CIPROFLOXACIN >=4  Resistant    ERTAPENEM <=0.5  Sensitive    GENTAMICIN >=16  Resistant    IMIPENEM <=0.25  Sensitive    LEVOFLOXACIN >=8  Resistant    NITROFURANTOIN <=16  Sensitive    PIP/TAZO <=4  Sensitive    TOBRAMYCIN 8  Intermediate    TRIMETH/SULFA <=20  Sensitive 3           1 Extended spectrum  beta-lactamase (ESBL) producing  organisms demonstrate decreased activity with  penicillins, cephalosporins and aztreonam.  2 For uncomplicated UTI caused by E. coli,  K. pneumoniae or P. mirabilis: Cefazolin is  susceptible if MIC <32 mcg/mL and predicts  susceptible to the oral agents cefaclor, cefdinir,  cefpodoxime, cefprozil, cefuroxime, cephalexin  and loracarbef.  3 Legend:  S = Susceptible  I = Intermediate  R = Resistant  NS = Not susceptible  * = Not tested  NR = Not reported  **NN = See antimicrobic comments        Specimen Collected: 12/04/20 09:55           Assessment & Plan:   Problem List Items Addressed This Visit   None Visit Diagnoses     Acute cystitis with hematuria    -  Primary   Relevant Medications   nitrofurantoin, macrocrystal-monohydrate, (MACROBID) 100 MG capsule   Foul smelling urine       Relevant Orders   POCT Urinalysis Dipstick (Completed)   Urine Culture   Antibiotic-induced yeast infection       Relevant Medications   nitrofurantoin, macrocrystal-monohydrate, (MACROBID) 100 MG capsule   fluconazole (DIFLUCAN) 150 MG tablet       UTI History of recurrent uti in past, now doing better Last UTI 05/2020 and 11/2020 Improved prophylaxis on D-Mannose formulation [place] Prior resistances on cultures reviewed mostly E Coli history of resistant one in past Success with bactrim, macrobid in past.  Will trial Macrobid now BID x 7 days since last course as Bactrim Awaiting urine culture and sensitivity, may adjust accordingly.  Will re try Bactrim-DS successful last visit, 5 day BID dosing Add Diflucan as PRN for yeast  Refills as well  Await updates on [place] This Encounter  Procedures   Urine Culture   POCT Urinalysis Dipstick     Meds ordered this encounter  Medications   nitrofurantoin, macrocrystal-monohydrate, (MACROBID) 100 MG capsule    Sig: Take 1 capsule (100 mg total) by mouth 2 (two)  times daily. For 7 days    Dispense:  14 capsule    Refill:  0   fluconazole (DIFLUCAN) 150 MG tablet    Sig: Take one tablet by mouth on Day 1. Repeat dose 2nd tablet on Day 3.    Dispense:  2 tablet    Refill:  1     Follow up plan: Return if symptoms worsen or fail to improve.   [name], [facility] 06/08/2021, 10:04 AM

## 2021-06-10 LAB — URINE CULTURE
MICRO NUMBER:: 12610487
SPECIMEN QUALITY:: ADEQUATE

## 2021-06-17 ENCOUNTER — Ambulatory Visit: Payer: Self-pay

## 2021-06-17 ENCOUNTER — Telehealth: Payer: Self-pay | Admitting: General Practice

## 2021-06-17 DIAGNOSIS — M25562 Pain in left knee: Secondary | ICD-10-CM

## 2021-06-17 DIAGNOSIS — G8929 Other chronic pain: Secondary | ICD-10-CM

## 2021-06-17 DIAGNOSIS — E782 Mixed hyperlipidemia: Secondary | ICD-10-CM

## 2021-06-17 DIAGNOSIS — M2550 Pain in unspecified joint: Secondary | ICD-10-CM

## 2021-06-17 DIAGNOSIS — F419 Anxiety disorder, unspecified: Secondary | ICD-10-CM

## 2021-06-17 DIAGNOSIS — F314 Bipolar disorder, current episode depressed, severe, without psychotic features: Secondary | ICD-10-CM

## 2021-06-17 NOTE — Patient Instructions (Signed)
Visit Information RNCM Clinical Goal(s):  Patient will verbalize understanding of plan for management of HLD, Anxiety, Depression, Bipolar Disorder, and chronic pain  verbalize basic understanding of HLD, Anxiety, Depression, Bipolar Disorder, and chronic pain  disease process and self health management plan by working with the CCM team to aide and assist in finding affordable health care to meet health and wellness goals take all medications exactly as prescribed and will call provider for medication related questions demonstrate understanding of rationale for each prescribed medication and take as directed  demonstrate improved and ongoing adherence to prescribed treatment plan for HLD, Anxiety, Depression, Bipolar Disorder, and chronic pain  as evidenced by adherence to prescribed medication regimen contacting provider for new or worsened symptoms or questions and obtaining charity care of [facility] to be able to help with chronic health conditions   demonstrate improved and ongoing health management independence by effective management of chronic conditions  continue to work with RN Care Manager to address care management and care coordination needs related to HLD, Anxiety, Depression, Bipolar Disorder, and chronic pain  work with Education officer, museum to address Financial constraints related to health care cost , Level of care concerns, and Mental Health Concerns  related to the management of HLD, Anxiety, Depression, Bipolar Disorder, and chronic pain  demonstrate a decrease in HLD, Anxiety, Depression, Bipolar Disorder, and chronic pain  exacerbations   demonstrate ongoing self health care management ability by effective management of chronic conditions  through collaboration with RN Care manager, provider, and care team.    Interventions: 1:1 collaboration with primary care provider regarding development and update of comprehensive plan of care as evidenced by provider attestation and  co-signature Inter-disciplinary care team collaboration (see longitudinal plan of care) Evaluation of current treatment plan related to  self management and patient's adherence to plan as established by provider     SDOH Barriers (Status: Goal on track: YES.)  Patient interviewed and SDOH assessment performed        SDOH Interventions     Flowsheet Row Most Recent Value  SDOH Interventions    Physical Activity Interventions Other (Comments)  [limited mobility due to chronic pain]  Stress Interventions Other (Comment)  [has had a hard year but is ready to take ownership of her health now]  Social Connections Interventions Other (Comment)  [has great support from [place] her significant other]  Depression Interventions/Treatment  --  Maria Jacobson to work with CCM team, sees psychiatry regularly, great support system]       Patient interviewed and appropriate assessments performed Provided patient with information about [facility] care and [facility], the patient has completed charity care paperwork and mailed recently, waiting to hear back from the approval process. 04-19-2021: Provided the patient with information about a letter being ready for pick up at the pcp office for the patient. 04-29-2021: The patient has received information from [facility] and they need additional documents. She feels once she submits the additional documents she will be able to get charity care. 06-17-2021: The patient has been accepted to [facility] at [facility] and is so thankful. Just got notification yesterday that she was accepted.  Discussed plans with patient for ongoing care management follow up and provided patient with direct contact information for care management team Advised patient to work with CCM team, look at [facility] paperwork, and look for charity care information Collaborated with RN Case Manager re: effective management of chronic conditions and help with obtaining needed healthcare for chronic conditions.  Provided  education to patient/caregiver regarding level of care options.       Anxiety, depression and bipolar  (Status: Goal on track: YES.) Evaluation of current treatment plan related to Anxiety, Depression, and Bipolar Disorder, Financial constraints related to affordable healthcare , Level of care concerns, and Mental Health Concerns  self-management and patient's adherence to plan as established by provider. Discussed plans with patient for ongoing care management follow up and provided patient with direct contact information for care management team Advised patient to look at [place] paperwork and see if this is something she feels she would benefit from ; Provided education to patient re: [facility] and charity care and working with the CCM team to meet mental health needs and other chronic conditions management ; Reviewed medications with patient and discussed complinace. The patient went to [facility] today to pick up psychiatric medications. 06-17-2021: The patient is compliant with medications and gets her medications from [facility] in [place]. They have been working with her for several years on her medications and effective management ; Collaborated with LCSW and pcp regarding patient completing charity care paperwork. 06-17-2021: Is working with the LCSW for ongoing support, education, and needs; Provided patient with [facility] and charity care educational materials related to assistance with meeting healthcare needs and receiveing care for chronic conditions. 04-29-2021: The patient has heard back from [facility] and needs additional documents. She is working on obtaining those documents to send back to charity care now. 06-17-2021: Accepted to charity care and found out yesterday Social Work referral for education and support for anxiety, depression, and bipolar disorder. 04-29-2021: The patient is actively working with the LCSW for ongoing support, education, and needs Discussed plans with patient for ongoing  care management follow up and provided patient with direct contact information for care management team; Screening for signs and symptoms of depression related to chronic disease state. 04-29-2021: The patient is upbeat and staying positive. She states she and [name] are going to [employer] to see her son Pierre Bali who is a Equities trader and a drum major in the marching band. He is also on the homecoming court and may be king of the court. She is excited and proud of him. She wants to be healthy for herself, and her family. 06-17-2021: The patient is having a very good day today. She has been accepted to charity care and was so excited to tell the RNCM. She feels like this is going to put her in the "land of the living" again. Assessed social determinant of health barriers;  Collaboration with the pcp after receiving information from the patient that she needed a letter documenting that she was physically unable to work by the pcp. The pcp has written the letter for the patient. Signed the letter and it is ready for pick up by the patient at the office. The patient corresponded with the RNCM through secure texting. The patient is very grateful for team support and collaboration in getting needed documents for the patient to have to turn in so that she may receive food stamps. Will continue to monitor for changes. 04-29-2021: The patient has received help with food stamps and the needed documents. The patient is thankful for the support of the CCM team and pcp. Knows to contact the [facility] or LCSW for any new concerns or needs that may arise. Will continue to monitor for changes. 06-17-2021: The patient is so appreciative of all the help she is getting from the pcp and RNCM.   Health  Maintenance (Status: Goal on track: YES.)  Patient interviewed about adult health maintenance status including Depression screen    Chronic pain management and support   Advised patient to discuss Depression screen    Chronic pain management   with primary care provider  Provided education about working with the CCM team for ongoing support and education for chronic conditions impacting the patients care           Hyperlipidemia:  (Status: Goal on track: YES.)      Lab Results  Component Value Date    CHOL 233 (H) 10/28/2019    HDL 50 10/28/2019    LDLCALC 153 (H) 10/28/2019    TRIG 165 (H) 10/28/2019    CHOLHDL 4.7 10/28/2019  04-29-2021: The patient has new lab work from outside source that she is going to supply to the office for records. States her cholesterol level is elevated. She is working on dietary changes.     Medication review performed; medication list updated in electronic medical record.  Provider established cholesterol goals reviewed. 04-29-2021: Review of cholesterol levels. The patient verbalized she has high levels. Will send paperwork from where she had blood work recently for medication management  Counseled on importance of regular laboratory monitoring as prescribed. 04-29-2021: Had recent lab work from outside source. Will send a copy to the office for records; Provided HLD educational materials. 04-29-2021: Discussed healthy eating options and sent healthy eating booklet by email to the patient. She and [name] have changed their eating habits and are monitoring their dietary habits; Reviewed role and benefits of statin for ASCVD risk reduction; Reviewed importance of limiting foods high in cholesterol;   Pain:  (Status: Goal on track: YES.) Pain assessment performed. 04-29-2021: States her pain is getting worse but she is managing well. She has things in place and can get more care when she is approved for charity care. She states that this will be a big help for her. She is completing documents now.  Medications reviewed. 04-29-2021: The patient takes Tylenol once a day. Was taking it 3 times a day but has found a water soluble CBD drink and a CBD oil tincture that has been effective in helping with her pain  control. She can tell a positive difference since using this product. 06-17-2021: The patient states she has heard back from [facility] and she has been approved.  She is so thankful and thinks this will be a huge help for changing her chronic pain.  She feels this will help her get back to the "land of the living". The patient states that her pain is getting worse and she knows she needs to address this. She was taking Tylenol 3 times a day but is using a CBD water soluble mixture that is helpful and a CBD oil tincture that has been very helpful with pain relief. She states that she wants to get better and be more active. Discussed safety concerns and the patient states that [name] is a huge support for her and assist her especially when she is going upstairs to the restroom. Denies any acute distress. Will continue to monitor.  Reviewed provider established plan for pain management. 06-17-2021: Review of safety and fall prevention. The patient denies any falls. States she is being careful and as an extra measure she has [name] go with her when she has to go upstairs to the bathroom. She is positive and hopeful that soon she will hear from charity care.  Discussed importance of  adherence to all scheduled medical appointments. 06-17-2021: Encouraged the patient to set up and appointment with charity care at [place] to start getting the full benefit of the program and help her with her chronic conditions; Counseled on the importance of reporting any/all new or changed pain symptoms or management strategies to pain management provider; Advised patient to report to care team affect of pain on daily activities; Discussed use of relaxation techniques and/or diversional activities to assist with pain reduction (distraction, imagery, relaxation, massage, acupressure, TENS, heat, and cold application; Reviewed with patient prescribed pharmacological and nonpharmacological pain relief strategies. 06-17-2021: Discussed  with the patient pain relief measures.  Advised patient to discuss referral for assessment and treatment options for chronic pain and discomfort  with provider;   Patient Goals/Self-Care Activities: Patient will self administer medications as prescribed Patient will attend all scheduled provider appointments Patient will call pharmacy for medication refills Patient will attend church or other social activities Patient will continue to perform ADL's independently Patient will continue to perform IADL's independently Patient will call provider office for new concerns or questions Patient will work with BSW to address care coordination needs and will continue to work with the clinical team to address health care and disease management related needs.   Patient will receive needed healthcare for chronic conditions and health and wellness maintainence             Patient verbalizes understanding of instructions provided today and agrees to view in [place].   Telephone follow up appointment with care management team member scheduled for: 08-19-2021 at [place] am  Noreene Larsson RN, MSN, [place] Mobile: [phone]

## 2021-06-17 NOTE — Chronic Care Management (AMB) (Signed)
Care Management    RN Visit Note  06/17/2021 Name: Maria Jacobson MRN: [id number] DOB: November 25, 1975  Subjective: Maria Jacobson is a 45 y.o. year old female who is a primary care patient of [name], DO. The care management team was consulted for assistance with disease management and care coordination needs.    Engaged with patient by telephone for follow up visit in response to provider referral for case management and/or care coordination services.   Consent to Services:   Maria Jacobson was given information about Care Management services today including:  Care Management services includes personalized support from designated clinical staff supervised by her physician, including individualized plan of care and coordination with other care providers 24/7 contact phone numbers for assistance for urgent and routine care needs. The patient may stop case management services at any time by phone call to the office staff.  Patient agreed to services and consent obtained.   Assessment: Review of patient past medical history, allergies, medications, health status, including review of consultants reports, laboratory and other test data, was performed as part of comprehensive evaluation and provision of chronic care management services.   SDOH (Social Determinants of Health) assessments and interventions performed:    Care Plan  Allergies  Allergen Reactions   Drug Ingredient [Black Walnut Pollen Allergy Skin Test]    Hazelnut ([place]) Allergy Skin Test    Pecan Extract Allergy Skin Test    Shellfish Allergy Hives    Outpatient Encounter Medications as of 06/17/2021  Medication Sig Note   acetaminophen (TYLENOL) 500 MG tablet Take 1,000 mg by mouth every 4 (four) hours as needed for headache.    albuterol (VENTOLIN HFA) 108 (90 Base) MCG/ACT inhaler INHALE TWO PUFFS BY MOUTH EVERY 4 HOURS AS NEEDED FOR WHEEZING OR FOR SHORTNESS OF BREATH OR FOR COUGH    ALPRAZolam (XANAX) 1  MG tablet Take 1 mg by mouth at bedtime as needed for anxiety.    amphetamine-dextroamphetamine (ADDERALL) 30 MG tablet Take 30 mg by mouth daily. 12/04/2019: QAM   ARIPiprazole (ABILIFY) 15 MG tablet Take 15 mg by mouth daily.    clonazePAM (KLONOPIN) 0.5 MG tablet Take 0.5 mg by mouth 5 (five) times daily.    cyclobenzaprine (FLEXERIL) 10 MG tablet Take 1 tablet (10 mg total) by mouth 3 (three) times daily as needed for muscle spasms.    fexofenadine (ALLEGRA) 180 MG tablet Take 180 mg by mouth daily as needed for allergies or rhinitis.    fluconazole (DIFLUCAN) 150 MG tablet Take one tablet by mouth on Day 1. Repeat dose 2nd tablet on Day 3.    furosemide (LASIX) 20 MG tablet TAKE 1 TABLET BY MOUTH EVERY DAY FOR SWELLING, MAY INCREASE TO 2 TABLETS IF NEEDED FOR MAX 7 DAYS PER FLARE    lamoTRIgine (LAMICTAL) 100 MG tablet Take 200 mg by mouth 2 (two) times daily. Taking 200 mg (2 tablets) each morning and 150 mg (1.5 tablets) each evening    melatonin 3 MG TABS tablet Take 3 mg by mouth at bedtime.    meloxicam (MOBIC) 15 MG tablet Take 1 tablet (15 mg total) by mouth daily.    nitrofurantoin, macrocrystal-monohydrate, (MACROBID) 100 MG capsule Take 1 capsule (100 mg total) by mouth 2 (two) times daily. For 7 days    omeprazole (PRILOSEC) 20 MG capsule Take 1 capsule (20 mg total) by mouth daily as needed.    Triamcinolone Acetonide (NASACORT ALLERGY 24HR NA) Place into the nose daily as needed.  No facility-administered encounter medications on file as of 06/17/2021.    Patient Active Problem List   Diagnosis Date Noted   Abnormal urine 05/01/2020   Abnormal urine odor 05/01/2020   Fatigue 05/01/2020   GERD (gastroesophageal reflux disease) 09/25/2019   Epidermal cyst of neck 06/13/2017   Atypical mole 10/05/2016   Bilateral lower extremity edema 06/15/2016   Pain and swelling of left lower leg 06/15/2016   Pain in joint, multiple sites 06/15/2016   Family history of rheumatoid  arthritis 06/15/2016   Chronic bilateral low back pain with right-sided sciatica 05/24/2016   Left medial knee pain 05/23/2016   Anxiety 05/23/2016   Bipolar affective disorder, depressed, severe ([place]) 05/23/2016   Morbid obesity with BMI of 50.0-59.9, adult ([place]) 05/23/2016   Hyperlipidemia 05/23/2016    Conditions to be addressed/monitored: HLD, Anxiety, Depression, Bipolar Disorder, and Chronic pain   Care Plan : RNCM: Adult General plan of care: Chronic pain, anxiety, depression, bipolar and HLD  Updates made by Vanita Ingles, RN since 06/17/2021 12:00 AM     Problem: RNCM: Adult General plan of care: Chronic pain, anxiety, depression, bipolar and HLD   Priority: High  Onset Date: 03/18/2021     Long-Range Goal: RNCM: Adult General plan of care: Chronic pain, anxiety, depression, bipolar and HLD   Start Date: 03/18/2021  Expected End Date: 03/18/2022  Recent Progress: On track  Priority: High  Note:   Current Barriers:  Knowledge Deficits related to plan of care for management of HLD, Anxiety with Excessive Worry, Social Anxiety,, Depression: depressed mood, fatigue, hopelessness, anxiety, loss of energy/fatigue,, and Bipolar Disorder, and chronic pain   Care Coordination needs related to Financial constraints related to the ability to afford reliable and consistent healthcare for chronic conditions, Level of care concerns, and Mental Health Concerns   Chronic Disease Management support and education needs related to HLD, Anxiety with Excessive Worry, Social Anxiety,, Depression: depressed mood, fatigue, hopelessness, anxiety, loss of energy/fatigue,, and Bipolar Disorder, and chronic pain  Financial Constraints.  Non-adherence to scheduled provider appointments  RNCM Clinical Goal(s):  Patient will verbalize understanding of plan for management of HLD, Anxiety, Depression, Bipolar Disorder, and chronic pain  verbalize basic understanding of HLD, Anxiety, Depression,  Bipolar Disorder, and chronic pain  disease process and self health management plan by working with the CCM team to aide and assist in finding affordable health care to meet health and wellness goals take all medications exactly as prescribed and will call provider for medication related questions demonstrate understanding of rationale for each prescribed medication and take as directed  demonstrate improved and ongoing adherence to prescribed treatment plan for HLD, Anxiety, Depression, Bipolar Disorder, and chronic pain  as evidenced by adherence to prescribed medication regimen contacting provider for new or worsened symptoms or questions and obtaining charity care of [facility] to be able to help with chronic health conditions   demonstrate improved and ongoing health management independence by effective management of chronic conditions  continue to work with RN Care Manager to address care management and care coordination needs related to HLD, Anxiety, Depression, Bipolar Disorder, and chronic pain  work with Education officer, museum to address Financial constraints related to health care cost , Level of care concerns, and Mental Health Concerns  related to the management of HLD, Anxiety, Depression, Bipolar Disorder, and chronic pain  demonstrate a decrease in HLD, Anxiety, Depression, Bipolar Disorder, and chronic pain  exacerbations   demonstrate ongoing self health care management ability by  effective management of chronic conditions  through collaboration with RN Care manager, provider, and care team.   Interventions: 1:1 collaboration with primary care provider regarding development and update of comprehensive plan of care as evidenced by provider attestation and co-signature Inter-disciplinary care team collaboration (see longitudinal plan of care) Evaluation of current treatment plan related to  self management and patient's adherence to plan as established by provider   SDOH Barriers (Status: Goal on  track: YES.)  Patient interviewed and SDOH assessment performed        SDOH Interventions    Flowsheet Row Most Recent Value  SDOH Interventions   Physical Activity Interventions Other (Comments)  [limited mobility due to chronic pain]  Stress Interventions Other (Comment)  [has had a hard year but is ready to take ownership of her health now]  Social Connections Interventions Other (Comment)  [has great support from [place] her significant other]  Depression Interventions/Treatment  --  [name] to work with CCM team, sees psychiatry regularly, great support system]     Patient interviewed and appropriate assessments performed Provided patient with information about [facility] care and [facility], the patient has completed charity care paperwork and mailed recently, waiting to hear back from the approval process. 04-19-2021: Provided the patient with information about a letter being ready for pick up at the pcp office for the patient. 04-29-2021: The patient has received information from [facility] and they need additional documents. She feels once she submits the additional documents she will be able to get charity care. 06-17-2021: The patient has been accepted to [facility] and is so thankful. Just got notification yesterday that she was accepted.  Discussed plans with patient for ongoing care management follow up and provided patient with direct contact information for care management team Advised patient to work with CCM team, look at [facility] paperwork, and look for charity care information Collaborated with RN Case Manager re: effective management of chronic conditions and help with obtaining needed healthcare for chronic conditions.  Provided education to patient/caregiver regarding level of care options.    Anxiety, depression and bipolar  (Status: Goal on track: YES.) Evaluation of current treatment plan related to Anxiety, Depression, and Bipolar Disorder, Financial constraints related to  affordable healthcare , Level of care concerns, and Mental Health Concerns  self-management and patient's adherence to plan as established by provider. Discussed plans with patient for ongoing care management follow up and provided patient with direct contact information for care management team Advised patient to look at [place] paperwork and see if this is something she feels she would benefit from ; Provided education to patient re: [facility] and charity care and working with the CCM team to meet mental health needs and other chronic conditions management ; Reviewed medications with patient and discussed complinace. The patient went to [facility] today to pick up psychiatric medications. 06-17-2021: The patient is compliant with medications and gets her medications from [facility] in [place]. They have been working with her for several years on her medications and effective management ; Collaborated with LCSW and pcp regarding patient completing charity care paperwork. 06-17-2021: Is working with the LCSW for ongoing support, education, and needs; Provided patient with [facility] and charity care educational materials related to assistance with meeting healthcare needs and receiveing care for chronic conditions. 04-29-2021: The patient has heard back from [facility] and needs additional documents. She is working on obtaining those documents to send back to charity care now. 06-17-2021: Accepted to charity care  and found out yesterday Social Work referral for education and support for anxiety, depression, and bipolar disorder. 04-29-2021: The patient is actively working with the LCSW for ongoing support, education, and needs Discussed plans with patient for ongoing care management follow up and provided patient with direct contact information for care management team; Screening for signs and symptoms of depression related to chronic disease state. 04-29-2021: The patient is upbeat and staying positive. She states she and  [name] are going to [employer] to see her son Pierre Bali who is a Equities trader and a drum major in the marching band. He is also on the homecoming court and may be king of the court. She is excited and proud of him. She wants to be healthy for herself, and her family. 06-17-2021: The patient is having a very good day today. She has been accepted to charity care and was so excited to tell the RNCM. She feels like this is going to put her in the "land of the living" again. Assessed social determinant of health barriers;  Collaboration with the pcp after receiving information from the patient that she needed a letter documenting that she was physically unable to work by the pcp. The pcp has written the letter for the patient. Signed the letter and it is ready for pick up by the patient at the office. The patient corresponded with the RNCM through secure texting. The patient is very grateful for team support and collaboration in getting needed documents for the patient to have to turn in so that she may receive food stamps. Will continue to monitor for changes. 04-29-2021: The patient has received help with food stamps and the needed documents. The patient is thankful for the support of the CCM team and pcp. Knows to contact the [facility] or LCSW for any new concerns or needs that may arise. Will continue to monitor for changes. 06-17-2021: The patient is so appreciative of all the help she is getting from the pcp and RNCM.  Health Maintenance (Status: Goal on track: YES.)  Patient interviewed about adult health maintenance status including Depression screen    Chronic pain management and support   Advised patient to discuss Depression screen    Chronic pain management  with primary care provider  Provided education about working with the CCM team for ongoing support and education for chronic conditions impacting the patients care      Hyperlipidemia:  (Status: Goal on track: YES.) Lab Results  Component Value Date    CHOL 233 (H) 10/28/2019   HDL 50 10/28/2019   LDLCALC 153 (H) 10/28/2019   TRIG 165 (H) 10/28/2019   CHOLHDL 4.7 10/28/2019  04-29-2021: The patient has new lab work from outside source that she is going to supply to the office for records. States her cholesterol level is elevated. She is working on dietary changes.    Medication review performed; medication list updated in electronic medical record.  Provider established cholesterol goals reviewed. 04-29-2021: Review of cholesterol levels. The patient verbalized she has high levels. Will send paperwork from where she had blood work recently for medication management  Counseled on importance of regular laboratory monitoring as prescribed. 04-29-2021: Had recent lab work from outside source. Will send a copy to the office for records; Provided HLD educational materials. 04-29-2021: Discussed healthy eating options and sent healthy eating booklet by email to the patient. She and [name] have changed their eating habits and are monitoring their dietary habits; Reviewed role and benefits of statin for  ASCVD risk reduction; Reviewed importance of limiting foods high in cholesterol;  Pain:  (Status: Goal on track: YES.) Pain assessment performed. 04-29-2021: States her pain is getting worse but she is managing well. She has things in place and can get more care when she is approved for charity care. She states that this will be a big help for her. She is completing documents now.  Medications reviewed. 04-29-2021: The patient takes Tylenol once a day. Was taking it 3 times a day but has found a water soluble CBD drink and a CBD oil tincture that has been effective in helping with her pain control. She can tell a positive difference since using this product. 06-17-2021: The patient states she has heard back from [facility] and she has been approved.  She is so thankful and thinks this will be a huge help for changing her chronic pain.  She feels this will help her  get back to the "land of the living". The patient states that her pain is getting worse and she knows she needs to address this. She was taking Tylenol 3 times a day but is using a CBD water soluble mixture that is helpful and a CBD oil tincture that has been very helpful with pain relief. She states that she wants to get better and be more active. Discussed safety concerns and the patient states that [name] is a huge support for her and assist her especially when she is going upstairs to the restroom. Denies any acute distress. Will continue to monitor.  Reviewed provider established plan for pain management. 06-17-2021: Review of safety and fall prevention. The patient denies any falls. States she is being careful and as an extra measure she has [name] go with her when she has to go upstairs to the bathroom. She is positive and hopeful that soon she will hear from charity care.  Discussed importance of adherence to all scheduled medical appointments. 06-17-2021: Encouraged the patient to set up and appointment with charity care at [place] to start getting the full benefit of the program and help her with her chronic conditions; Counseled on the importance of reporting any/all new or changed pain symptoms or management strategies to pain management provider; Advised patient to report to care team affect of pain on daily activities; Discussed use of relaxation techniques and/or diversional activities to assist with pain reduction (distraction, imagery, relaxation, massage, acupressure, TENS, heat, and cold application; Reviewed with patient prescribed pharmacological and nonpharmacological pain relief strategies. 06-17-2021: Discussed with the patient pain relief measures.  Advised patient to discuss referral for assessment and treatment options for chronic pain and discomfort  with provider;  Patient Goals/Self-Care Activities: Patient will self administer medications as prescribed Patient will attend all  scheduled provider appointments Patient will call pharmacy for medication refills Patient will attend church or other social activities Patient will continue to perform ADL's independently Patient will continue to perform IADL's independently Patient will call provider office for new concerns or questions Patient will work with BSW to address care coordination needs and will continue to work with the clinical team to address health care and disease management related needs.   Patient will receive needed healthcare for chronic conditions and health and wellness maintainence       Plan: Telephone follow up appointment with care management team member scheduled for:  08-19-2021 at [place] am  [place], MSN, [place] Mobile: [phone]

## 2021-06-28 ENCOUNTER — Encounter: Payer: Self-pay | Admitting: Family Medicine

## 2021-06-28 DIAGNOSIS — M25362 Other instability, left knee: Secondary | ICD-10-CM

## 2021-06-28 DIAGNOSIS — M25562 Pain in left knee: Secondary | ICD-10-CM

## 2021-06-28 DIAGNOSIS — G8929 Other chronic pain: Secondary | ICD-10-CM

## 2021-06-29 NOTE — Addendum Note (Signed)
Addended by: [name] on: 06/29/2021 02:30 PM   Modules accepted: Orders

## 2021-07-27 ENCOUNTER — Ambulatory Visit: Payer: Self-pay | Admitting: Family Medicine

## 2021-07-27 ENCOUNTER — Encounter: Payer: Self-pay | Admitting: Family Medicine

## 2021-07-27 ENCOUNTER — Other Ambulatory Visit: Payer: Self-pay

## 2021-07-27 VITALS — BP 144/90 | HR 105 | Ht 69.0 in | Wt 365.0 lb

## 2021-07-27 DIAGNOSIS — T3695XA Adverse effect of unspecified systemic antibiotic, initial encounter: Secondary | ICD-10-CM

## 2021-07-27 DIAGNOSIS — N390 Urinary tract infection, site not specified: Secondary | ICD-10-CM

## 2021-07-27 DIAGNOSIS — B379 Candidiasis, unspecified: Secondary | ICD-10-CM

## 2021-07-27 LAB — POCT URINALYSIS DIPSTICK
Bilirubin, UA: NEGATIVE
Blood, UA: NEGATIVE
Glucose, UA: NEGATIVE
Ketones, UA: NEGATIVE
Nitrite, UA: NEGATIVE
Protein, UA: NEGATIVE
Spec Grav, UA: 1.01 (ref 1.010–1.025)
Urobilinogen, UA: 0.2 E.U./dL
pH, UA: 7 (ref 5.0–8.0)

## 2021-07-27 MED ORDER — FLUCONAZOLE 150 MG PO TABS: ORAL_TABLET | ORAL | 1 refills | Status: DC

## 2021-07-27 MED ORDER — SULFAMETHOXAZOLE-TRIMETHOPRIM 800-160 MG PO TABS: 1.0000 | ORAL_TABLET | Freq: Two times a day (BID) | ORAL | 0 refills | Status: AC

## 2021-07-27 NOTE — Patient Instructions (Addendum)
Thank you for coming to the office today.  Referral to Urology - stay tuned for apt, they should contact you.  [facility] at [place], [place] [address] Ph: [phone]  Trial on Bactrim x 10 day course, longer course.  May need prophylaxis antibiotic in future for prevention.  If symptoms worsening, developing nausea / vomiting, worsening back pain, fevers / chills / sweats, then please return for re-evaluation sooner.  If you take AZO OTC - limit this to 2-3 days MAX to avoid affecting kidneys  D-Mannose is a natural supplement that can actually help bind to urinary bacteria and reduce their effectiveness it can help prevent UTI from forming, and may reduce some symptoms. It likely cannot cure an active UTI but it is worth a try and good to prevent them with. Try 555m twice a day at a full dose if you want, or check package instructions for more info   Please schedule a Follow-up Appointment to: Return if symptoms worsen or fail to improve.  If you have any other questions or concerns, please feel free to call the office or send a message through [place] You may also schedule an earlier appointment if necessary.  Additionally, you may be receiving a survey about your experience at our office within a few days to 1 week by e-mail or mail. We value your feedback.  ANobie Putnam DO [place]

## 2021-07-27 NOTE — Progress Notes (Signed)
Date:  07/27/2021   Name:  Maria Jacobson   DOB:  February 18, 1976   MRN:  [id number]   Chief Complaint: Urinary Tract Infection  Urinary Tract Infection  This is a recurrent problem. The current episode started 1 to 4 weeks ago. The problem occurs intermittently. The quality of the pain is described as burning. There has been no fever. Associated symptoms include urgency. Pertinent negatives include no hematuria. She has tried antibiotics for the symptoms. The treatment provided mild relief.   Has recurrent UTI episodes. Has had ESBL E Coli in past. Has been on variety of antibiotics in past including Bactrim, Keflex, Macrobid most recently. Has not seen urologist. - Admits urinary odor and urinary frequency urgency at times  Updates on Knee Pain Established w/ [facility] Has had X-rays imaging, determined severe osteoarthritis, with ligament damage. She was treated with cortisone injections still, then pursuing Aqua Therapy / PT and future reconsider surgery as last option.  Lab Results  Component Value Date   NA 137 10/28/2019   K 4.4 10/28/2019   CO2 28 10/28/2019   GLUCOSE 111 (H) 10/28/2019   BUN 13 10/28/2019   CREATININE 0.95 10/28/2019   CALCIUM 9.5 10/28/2019   GFRNONAA 72 10/28/2019   Lab Results  Component Value Date   CHOL 233 (H) 10/28/2019   HDL 50 10/28/2019   LDLCALC 153 (H) 10/28/2019   TRIG 165 (H) 10/28/2019   CHOLHDL 4.7 10/28/2019   Lab Results  Component Value Date   TSH 0.79 10/28/2019   Lab Results  Component Value Date   HGBA1C 5.6 10/28/2019   Lab Results  Component Value Date   WBC 8.1 10/28/2019   HGB 14.9 10/28/2019   HCT 45.7 (H) 10/28/2019   MCV 87.5 10/28/2019   PLT 422 (H) 10/28/2019   Lab Results  Component Value Date   ALT 37 (H) 10/28/2019   AST 20 10/28/2019   ALKPHOS 65 07/06/2016   BILITOT 0.5 10/28/2019   Lab Results  Component Value Date   VD25OH 33 07/06/2016     Review of Systems  Genitourinary:  Positive for  urgency. Negative for hematuria.   Patient Active Problem List   Diagnosis Date Noted   Abnormal urine 05/01/2020   Abnormal urine odor 05/01/2020   Fatigue 05/01/2020   GERD (gastroesophageal reflux disease) 09/25/2019   Epidermal cyst of neck 06/13/2017   Atypical mole 10/05/2016   Bilateral lower extremity edema 06/15/2016   Pain and swelling of left lower leg 06/15/2016   Pain in joint, multiple sites 06/15/2016   Family history of rheumatoid arthritis 06/15/2016   Chronic bilateral low back pain with right-sided sciatica 05/24/2016   Left medial knee pain 05/23/2016   Anxiety 05/23/2016   Bipolar affective disorder, depressed, severe ([place]) 05/23/2016   Morbid obesity with BMI of 50.0-59.9, adult ([place]) 05/23/2016   Hyperlipidemia 05/23/2016    Allergies  Allergen Reactions   Drug Ingredient [Black Walnut Pollen Allergy Skin Test]    Hazelnut ([place]) Allergy Skin Test    Pecan Extract Allergy Skin Test    Shellfish Allergy Hives    Past Surgical History:  Procedure Laterality Date   BREAST BIOPSY Right    DILATION AND CURETTAGE OF UTERUS      Social History   Tobacco Use   Smoking status: Never   Smokeless tobacco: Never  Vaping Use   Vaping Use: Never used  Substance Use Topics   Alcohol use: Not Currently    Comment:  rarely   Drug use: No     Medication list has been reviewed and updated.  No outpatient medications have been marked as taking for the 07/27/21 encounter (Office Visit) with [name], DO.    [facility] 2/9 Scores 06/08/2021 03/18/2021 03/18/2021 07/13/2020  PHQ - 2 Score 4 2 2 2   PHQ- 9 Score 17 12 - 12  Exception Documentation - - - -    GAD 7 : Generalized Anxiety Score 06/08/2021 07/13/2020  Nervous, Anxious, on Edge 2 2  Control/stop worrying 3 2  Worry too much - different things 3 3  Trouble relaxing 3 2  Restless 2 1  Easily annoyed or irritable 2 2  Afraid - awful might happen 3 3  Total GAD 7 Score 18 15  Anxiety  Difficulty Extremely difficult Somewhat difficult   BP (!) 144/90    Pulse (!) 105    Ht 5' 9"  (1.753 m)    Wt (!) 365 lb (165.6 kg)    SpO2 100%    BMI 53.90 kg/m    BP Readings from Last 3 Encounters:  06/08/21 130/90  12/04/20 (!) 150/87  07/13/20 125/82    Physical Exam Vitals and nursing note reviewed.  Constitutional:      General: She is not in acute distress.    Appearance: Normal appearance. She is well-developed. She is not diaphoretic.     Comments: Well-appearing, comfortable, cooperative  HENT:     Head: Normocephalic and atraumatic.  Eyes:     General:        Right eye: No discharge.        Left eye: No discharge.     Conjunctiva/sclera: Conjunctivae normal.  Cardiovascular:     Rate and Rhythm: Normal rate.  Pulmonary:     Effort: Pulmonary effort is normal.  Skin:    General: Skin is warm and dry.     Findings: No erythema or rash.  Neurological:     Mental Status: She is alert and oriented to person, place, and time.  Psychiatric:        Mood and Affect: Mood normal.        Behavior: Behavior normal.        Thought Content: Thought content normal.     Comments: Well groomed, good eye contact, normal speech and thoughts    Previous Urine Culture 06/08/21  1 mo ago    MICRO NUMBER: [id number]   SPECIMEN QUALITY: Adequate   Sample Source URINE, CLEAN CATCH   STATUS: FINAL   ISOLATE 1: Escherichia coli Abnormal    Comment: 10,000-49,000 CFU/mL of Escherichia coli  Resulting Agency [facility] [place]     Susceptibility   Escherichia coli    URINE CULTURE, REFLEX    AMOX/CLAVULANIC 4  Sensitive    AMPICILLIN >=32  Resistant    AMPICILLIN/SULBACTAM >=32  Resistant    CEFAZOLIN <=4  Not Reportable 1    CEFEPIME <=1  Sensitive    CEFTAZIDIME <=1  Sensitive    CEFTRIAXONE <=1  Sensitive    CIPROFLOXACIN <=0.25  Sensitive    GENTAMICIN <=1  Sensitive    IMIPENEM <=0.25  Sensitive    LEVOFLOXACIN <=0.12  Sensitive    NITROFURANTOIN <=16   Sensitive    PIP/TAZO <=4  Sensitive    TOBRAMYCIN <=1  Sensitive    TRIMETH/SULFA <=20  Sensitive 2           1 For infections other than uncomplicated UTI  caused by  E. coli, K. pneumoniae or P. mirabilis:  Cefazolin is resistant if MIC > or = 8 mcg/mL.  (Distinguishing susceptible versus intermediate  for isolates with MIC < or = 4 mcg/mL requires  additional testing.)  For uncomplicated UTI caused by E. coli,  K. pneumoniae or P. mirabilis: Cefazolin is  susceptible if MIC <32 mcg/mL and predicts  susceptible to the oral agents cefaclor, cefdinir,  cefpodoxime, cefprozil, cefuroxime, cephalexin  and loracarbef.   2 Legend:  S = Susceptible  I = Intermediate  R = Resistant  NS = Not susceptible  * = Not tested  NR = Not reported  **NN = See antimicrobic comments         Specimen Collected: 06/08/21 10:28         Assessment and Plan:  Problem List Items Addressed This Visit   None Visit Diagnoses     Recurrent UTI    -  Primary   Relevant Medications   sulfamethoxazole-trimethoprim (BACTRIM DS) 800-160 MG tablet   fluconazole (DIFLUCAN) 150 MG tablet   Other Relevant Orders   POCT Urinalysis Dipstick (Completed)   Urine Culture   Ambulatory referral to Urology   Antibiotic-induced yeast infection       Relevant Medications   sulfamethoxazole-trimethoprim (BACTRIM DS) 800-160 MG tablet   fluconazole (DIFLUCAN) 150 MG tablet      Recurrent UTI Clinical history suggestive of recurrent UTI however cannot rule out structural etiology or other inflammatory condition UA today Check urine culture Start bactrim DS up to max 10 day course Fluconazole for yeast infection after if need Referral to  [facility] for further eval will benefit from PVR / cystoscopy in future ultimately if need diagnostic eval of etiology of recurrent UTI  Osteoarthritis Knees Ligament damage as reported. Followed by [facility] now Pursuing injections cortisone and therapy  with aqua therapy PT Future reconsider surgical intervention  Orders Placed This Encounter  Procedures   Urine Culture   Ambulatory referral to Urology    Referral Priority:   Routine    Referral Type:   Consultation    Referral Reason:   Specialty Services Required    Requested Specialty:   Urology    Number of Visits Requested:   1   POCT Urinalysis Dipstick     [name], [facility] 07/27/2021, 1:29 PM

## 2021-07-30 LAB — URINE CULTURE
MICRO NUMBER:: 12803449
SPECIMEN QUALITY:: ADEQUATE

## 2021-08-10 ENCOUNTER — Ambulatory Visit: Payer: Self-pay | Admitting: Licensed Clinical Social Worker

## 2021-08-12 NOTE — Patient Instructions (Signed)
Visit Information  Thank you for taking time to visit with me today. Please don't hesitate to contact me if I can be of assistance to you before our next scheduled telephone appointment.  Following are the goals we discussed today:  Patient Goals/Self-Care Activities: Over the next 120 days Attend scheduled appointments with providers Contact clinic with any questions or concerns Call [place] [phone] Follow up on insurance options [facility] Financial Assistance) Continue with compliance of taking medication   Our next appointment is by telephone on 11/16/21   Please call the care guide team at [phone] if you need to cancel or reschedule your appointment.   If you are experiencing a Mental Health or [place] or need someone to talk to, please call the Suicide and Crisis Lifeline: 988 call 911   Patient verbalizes understanding of instructions and care plan provided today and agrees to view in [place]. Active MyChart status confirmed with patient.    Christa See, MSW, [facility] Care Management [email or web address] Phone [phone] 10:02 AM

## 2021-08-12 NOTE — Chronic Care Management (AMB) (Signed)
Care Management Clinical Social Work Note  08/12/2021 Name: Maria Jacobson MRN: [id number] DOB: 1975-10-01  Maria Jacobson is a 46 y.o. year old female who is a primary care patient of [name], DO.  The Care Management team was consulted for assistance with chronic disease management and coordination needs.  Engaged with patient by telephone for follow up visit in response to provider referral for social work chronic care management and care coordination services  Consent to Services:  Ms. [name] was given information about Care Management services today including:  Care Management services includes personalized support from designated clinical staff supervised by her physician, including individualized plan of care and coordination with other care providers 24/7 contact phone numbers for assistance for urgent and routine care needs. The patient may stop case management services at any time by phone call to the office staff.  Patient agreed to services and consent obtained.    Summary:  Patient continues to maintain positive progress with care plan goals. Patient is very appreciative for the ability to meet with multiple specialists at [facility] to assist with management of chronic health conditions. She is waiting to schedule with Urology and Weight Management.  See Care Plan below for interventions and patient self-care activities.  Recommendation: Patient may benefit from, and is in agreement work with LCSW to address care coordination needs and will continue to work with the clinical team to address health care and disease management related needs.   Follow up Plan: Patient would like continued follow-up from CCM LCSW.  per patient's request will follow up in 11/16/21.  Will call office if needed prior to next encounter.  SDOH (Social Determinants of Health) assessments and interventions performed:    Advanced Directives Status: Not addressed in this encounter.  Care  Plan  Allergies  Allergen Reactions   Drug Ingredient [Black Walnut Pollen Allergy Skin Test]    Hazelnut ([place]) Allergy Skin Test    Pecan Extract Allergy Skin Test    Shellfish Allergy Hives    Outpatient Encounter Medications as of 08/10/2021  Medication Sig Note   acetaminophen (TYLENOL) 500 MG tablet Take 1,000 mg by mouth every 4 (four) hours as needed for headache.    albuterol (VENTOLIN HFA) 108 (90 Base) MCG/ACT inhaler INHALE TWO PUFFS BY MOUTH EVERY 4 HOURS AS NEEDED FOR WHEEZING OR FOR SHORTNESS OF BREATH OR FOR COUGH    ALPRAZolam (XANAX) 1 MG tablet Take 1 mg by mouth at bedtime as needed for anxiety.    amphetamine-dextroamphetamine (ADDERALL) 30 MG tablet Take 30 mg by mouth daily. 12/04/2019: QAM   ARIPiprazole (ABILIFY) 15 MG tablet Take 15 mg by mouth daily.    clonazePAM (KLONOPIN) 0.5 MG tablet Take 0.5 mg by mouth 5 (five) times daily.    cyclobenzaprine (FLEXERIL) 10 MG tablet Take 1 tablet (10 mg total) by mouth 3 (three) times daily as needed for muscle spasms.    fexofenadine (ALLEGRA) 180 MG tablet Take 180 mg by mouth daily as needed for allergies or rhinitis.    fluconazole (DIFLUCAN) 150 MG tablet Take one tablet by mouth on Day 1. Repeat dose 2nd tablet on Day 3.    furosemide (LASIX) 20 MG tablet TAKE 1 TABLET BY MOUTH EVERY DAY FOR SWELLING, MAY INCREASE TO 2 TABLETS IF NEEDED FOR MAX 7 DAYS PER FLARE    lamoTRIgine (LAMICTAL) 100 MG tablet Take 200 mg by mouth 2 (two) times daily. Taking 200 mg (2 tablets) each morning and 150 mg (1.5 tablets)  each evening    melatonin 3 MG TABS tablet Take 3 mg by mouth at bedtime.    meloxicam (MOBIC) 15 MG tablet Take 1 tablet (15 mg total) by mouth daily.    omeprazole (PRILOSEC) 20 MG capsule Take 1 capsule (20 mg total) by mouth daily as needed.    Triamcinolone Acetonide (NASACORT ALLERGY 24HR NA) Place into the nose daily as needed.    No facility-administered encounter medications on file as of 08/10/2021.     Patient Active Problem List   Diagnosis Date Noted   Abnormal urine 05/01/2020   Abnormal urine odor 05/01/2020   Fatigue 05/01/2020   GERD (gastroesophageal reflux disease) 09/25/2019   Epidermal cyst of neck 06/13/2017   Atypical mole 10/05/2016   Bilateral lower extremity edema 06/15/2016   Pain and swelling of left lower leg 06/15/2016   Pain in joint, multiple sites 06/15/2016   Family history of rheumatoid arthritis 06/15/2016   Chronic bilateral low back pain with right-sided sciatica 05/24/2016   Left medial knee pain 05/23/2016   Anxiety 05/23/2016   Bipolar affective disorder, depressed, severe ([place]) 05/23/2016   Morbid obesity with BMI of 50.0-59.9, adult ([place]) 05/23/2016   Hyperlipidemia 05/23/2016    Conditions to be addressed/monitored: Anxiety, Depression, and Bipolar Disorder; Grief  Care Plan : General Social Work (Adult)  Updates made by Rebekah Chesterfield, LCSW since 08/12/2021 12:00 AM     Problem: Coping Skills (General Plan of Care)      Long-Range Goal: Coping Skills Enhanced   Start Date: 03/31/2021  This Visit's Progress: On track  Recent Progress: On track  Priority: High  Note:   Current barriers:   Severe Persistent Mental Health needs related to Bipolar Disorder and Anxiety Financial constraints related to medical coverage Needs Support, Education, and Care Coordination in order to meet unmet mental health needs. Clinical Goal(s): demonstrate a reduction in symptoms related to :Anxiety  and Bipolar Disorder  explore community resource options for unmet needs related to:No health insurance    Clinical Interventions:  Assessed patient's previous and current treatment, coping skills, support system and barriers to care  Patient is coping with the loss of partner's mother December 2022. She and partner [place]resent Patient participates in medication management through [facility] in [facility] (approx.  20 years) Patient enjoys services provided and feels strongly supported. Has an appointment 04/02/21 She uses the public health pharmacy to assist with medication financial assistance 10/26: Patient continues to receive services through [facility]. Patient is at risk of having to discontinue services due [place] of county. CCM LCSW provided patient with information on [facility] to assist with obtaining services in [facility] for uninsured patients Patient is interested in establishing therapy to address possible symptoms of PTSD. She is thinking about going to [place] 10/26: Patient shared that [facility] offered to provide therapy; however, it is too far to participate in sessions twice a month. Patient reports tele-health may trigger anxiety. Resources for local therapy provided 1/10: Patient continues to receive med management through [facility]. She will pick up refills tomorrow to ensure compliance while out of town Support system includes fiance of fifteen years, mother and son, Pierre Bali resides in [place] No resource needs. Applied for [employer] 1/10: Patient is very appreciative for the ability to meet with multiple specialists at [facility] to assist with management of chronic health conditions. She is waiting to schedule with Urology and Weight Management Patient's  pain has been managed well after receiving cortisone shots. She will start aqua therapy when she returns Patient agreed to contact Social Security Office to initiate application for disability due to being unable to work for ten years  Patient is interested in completing CAFA application in the near future. CCM LCSW encouraged patient to contact LCSW with any questions or concerns regarding application Review various resources, discussed options and provided patient information about Department of Social Services ( CCM LCSW provided contact number ) Depression screen reviewed , Solution-Focused Strategies,  Active listening / Reflection utilized , Emotional Supportive Provided, Participation in counseling encouraged , Verbalization of feelings encouraged , and Suicidal Ideation/Homicidal Ideation assessed: Denies SI/HI  1:1 collaboration with primary care provider regarding development and update of comprehensive plan of care as evidenced by provider attestation and co-signature Inter-disciplinary care team collaboration (see longitudinal plan of care) Patient Goals/Self-Care Activities: Over the next 120 days Attend scheduled appointments with providers Contact clinic with any questions or concerns Call [place] [phone] Follow up on insurance options [facility] Financial Assistance) Continue with compliance of taking medication         Christa See, MSW, [email or web address] Phone [phone] 10:01 AM

## 2021-08-19 ENCOUNTER — Ambulatory Visit: Payer: Self-pay

## 2021-08-19 ENCOUNTER — Telehealth: Payer: Self-pay

## 2021-08-19 DIAGNOSIS — E782 Mixed hyperlipidemia: Secondary | ICD-10-CM

## 2021-08-19 DIAGNOSIS — F419 Anxiety disorder, unspecified: Secondary | ICD-10-CM

## 2021-08-19 DIAGNOSIS — M5441 Lumbago with sciatica, right side: Secondary | ICD-10-CM

## 2021-08-19 DIAGNOSIS — G8929 Other chronic pain: Secondary | ICD-10-CM

## 2021-08-19 DIAGNOSIS — N390 Urinary tract infection, site not specified: Secondary | ICD-10-CM

## 2021-08-19 DIAGNOSIS — F314 Bipolar disorder, current episode depressed, severe, without psychotic features: Secondary | ICD-10-CM

## 2021-08-19 NOTE — Patient Instructions (Signed)
Visit Information  Thank you for taking time to visit with me today. Please don't hesitate to contact me if I can be of assistance to you before our next scheduled telephone appointment.  Following are the goals we discussed today:  RNCM Clinical Goal(s):  Patient will verbalize understanding of plan for management of HLD, Anxiety, Depression, Bipolar Disorder, and chronic pain  verbalize basic understanding of HLD, Anxiety, Depression, Bipolar Disorder, and chronic pain  disease process and self health management plan by working with the CCM team to aide and assist in finding affordable health care to meet health and wellness goals take all medications exactly as prescribed and will call provider for medication related questions demonstrate understanding of rationale for each prescribed medication and take as directed  demonstrate improved and ongoing adherence to prescribed treatment plan for HLD, Anxiety, Depression, Bipolar Disorder, and chronic pain  as evidenced by adherence to prescribed medication regimen contacting provider for new or worsened symptoms or questions and obtaining charity care of [facility] to be able to help with chronic health conditions  demonstrate improved and ongoing health management independence by effective management of chronic conditions  continue to work with RN Care Manager to address care management and care coordination needs related to HLD, Anxiety, Depression, Bipolar Disorder, and chronic pain  work with Education officer, museum to address Financial constraints related to health care cost , Level of care concerns, and Mental Health Concerns  related to the management of HLD, Anxiety, Depression, Bipolar Disorder, and chronic pain  demonstrate a decrease in HLD, Anxiety, Depression, Bipolar Disorder, and chronic pain  exacerbations  demonstrate ongoing self health care management ability by effective management of chronic conditions through collaboration with RN Care manager,  provider, and care team.    Interventions: 1:1 collaboration with primary care provider regarding development and update of comprehensive plan of care as evidenced by provider attestation and co-signature Inter-disciplinary care team collaboration (see longitudinal plan of care) Evaluation of current treatment plan related to  self management and patient's adherence to plan as established by provider     SDOH Barriers (Status: Goal on track: YES.)  Patient interviewed and SDOH assessment performed        SDOH Interventions     Flowsheet Row Most Recent Value  SDOH Interventions    Physical Activity Interventions Other (Comments)  [limited mobility due to chronic pain]  Stress Interventions Other (Comment)  [has had a hard year but is ready to take ownership of her health now]  Social Connections Interventions Other (Comment)  [has great support from [place] her significant other]  Depression Interventions/Treatment  --  [name] to work with CCM team, sees psychiatry regularly, great support system]       Patient interviewed and appropriate assessments performed Provided patient with information about [facility] care and [facility], the patient has completed charity care paperwork and mailed recently, waiting to hear back from the approval process. 04-19-2021: Provided the patient with information about a letter being ready for pick up at the pcp office for the patient. 04-29-2021: The patient has received information from [facility] and they need additional documents. She feels once she submits the additional documents she will be able to get charity care. 06-17-2021: The patient has been accepted to [facility] and is so thankful. Just got notification yesterday that she was accepted. 08-19-2021: Is working with specialist for management of chronic conditions. Has PT starting on 09-16-2021. Has seen ortho. Waiting on urology and weight management appointments  Discussed plans with patient for  ongoing care management follow up and provided patient with direct contact information for care management team Advised patient to work with CCM team, look at [facility] paperwork, and look for charity care information Collaborated with RN Case Manager re: effective management of chronic conditions and help with obtaining needed healthcare for chronic conditions.  Provided education to patient/caregiver regarding level of care options.       Anxiety, depression and bipolar  (Status: Goal on track: YES.) Evaluation of current treatment plan related to Anxiety, Depression, and Bipolar Disorder, Financial constraints related to affordable healthcare , Level of care concerns, and Mental Health Concerns  self-management and patient's adherence to plan as established by provider. 08-19-2021: The patient is doing well and is currently at the beach [place] 5 weeks. Her significant other is working there and they are enjoying the time there. The patient states considering losing [name]'s mom on Christmas Eve they are doing well. They are able to go out on the beach and this is a time of reflexion. She is working on several things to get balance to her life and is optimistic about positive changes in her health and well being.  Discussed plans with patient for ongoing care management follow up and provided patient with direct contact information for care management team Advised patient to look at [place] paperwork and see if this is something she feels she would benefit from ; Provided education to patient re: [facility] and charity care and working with the CCM team to meet mental health needs and other chronic conditions management ; Reviewed medications with patient and discussed complinace. The patient went to [facility] today to pick up psychiatric medications. 08-19-2021: The patient is compliant with medications and gets her medications from [facility] in [place]. They have been working with her for several years on her medications  and effective management. Due to not living in the county they may not support her medications going forward. She is working with the LCSW to see about funding from [facility]; [place] with LCSW and pcp regarding patient completing charity care paperwork. 06-17-2021: Is working with the LCSW for ongoing support, education, and needs. 08-19-2021: Ongoing support and education for patient; Provided patient with [facility] and charity care educational materials related to assistance with meeting healthcare needs and receiveing care for chronic conditions. 04-29-2021: The patient has heard back from [facility] and needs additional documents. She is working on obtaining those documents to send back to charity care now. 06-17-2021: Accepted to charity care and found out yesterday. 08-19-2021: Is working with specialist to meet her needs Social Work referral for education and support for anxiety, depression, and bipolar disorder. 08-19-2021: The patient is actively working with the LCSW for ongoing support, education, and needs Discussed plans with patient for ongoing care management follow up and provided patient with direct contact information for care management team; Screening for signs and symptoms of depression related to chronic disease state. 04-29-2021: The patient is upbeat and staying positive. She states she and [name] are going to [employer] to see her son Pierre Bali who is a Equities trader and a drum major in the marching band. He is also on the homecoming court and may be king of the court. She is excited and proud of him. She wants to be healthy for herself, and her family. 06-17-2021: The patient is having a very good day today. She has been accepted to charity care and was so excited to tell the RNCM.  She feels like this is going to put her in the "land of the living" again. 08-19-2021: The patient is doing well today. She is currently at the beach with her significant other and enjoying being there. She denies  any acute distress. Empathetic listening and support given.  Assessed social determinant of health barriers;  Collaboration with the pcp after receiving information from the patient that she needed a letter documenting that she was physically unable to work by the pcp. The pcp has written the letter for the patient. Signed the letter and it is ready for pick up by the patient at the office. The patient corresponded with the RNCM through secure texting. The patient is very grateful for team support and collaboration in getting needed documents for the patient to have to turn in so that she may receive food stamps. Will continue to monitor for changes. 04-29-2021: The patient has received help with food stamps and the needed documents. The patient is thankful for the support of the CCM team and pcp. Knows to contact the [facility] or LCSW for any new concerns or needs that may arise. Will continue to monitor for changes. 06-17-2021: The patient is so appreciative of all the help she is getting from the pcp and RNCM.   Health Maintenance (Status: Goal on track: YES.)  Patient interviewed about adult health maintenance status including Depression screen    Chronic pain management and support   Advised patient to discuss Depression screen    Chronic pain management  with primary care provider  Provided education about working with the CCM team for ongoing support and education for chronic conditions impacting the patients care 08-19-2021: Review of reoccurring UTI sx and sx. The patient saw the pcp on 07-27-2021 for UTI sx and sx. The patient states this has completely resolved and she is being proactive in monitoring for reoccurring sx and sx. She is taking probiotics and drinking cranberry juice. Will continue to monitor for changes. Education on calling for changes in condition or worsening sx and sx of possible UTI.           Hyperlipidemia:  (Status: Goal on track: YES.)      Lab Results  Component Value  Date    CHOL 233 (H) 10/28/2019    HDL 50 10/28/2019    LDLCALC 153 (H) 10/28/2019    TRIG 165 (H) 10/28/2019    CHOLHDL 4.7 10/28/2019  04-29-2021: The patient has new lab work from outside source that she is going to supply to the office for records. States her cholesterol level is elevated. She is working on dietary changes.     Medication review performed; medication list updated in electronic medical record.  Provider established cholesterol goals reviewed. 04-29-2021: Review of cholesterol levels. The patient verbalized she has high levels. Will send paperwork from where she had blood work recently for medication management  Counseled on importance of regular laboratory monitoring as prescribed. 04-29-2021: Had recent lab work from outside source. Will send a copy to the office for records. 08-19-2021: Unable to obtain labwork. Education and support given.  Provided HLD educational materials. 04-29-2021: Discussed healthy eating options and sent healthy eating booklet by email to the patient. She and [name] have changed their eating habits and are monitoring their dietary habits. 08-19-2021: Review of heart healthy/ADA diet ; Reviewed role and benefits of statin for ASCVD risk reduction; Reviewed importance of limiting foods high in cholesterol;   Pain:  (Status: Goal on track: YES.) Pain assessment  performed. 04-29-2021: States her pain is getting worse but she is managing well. She has things in place and can get more care when she is approved for charity care. She states that this will be a big help for her. She is completing documents now. 08-19-2021: The patient rates her pain level today at a 6 or 7. She states it is much better than what it was and she is so thankful. She saw specialist with charity care and they gave her cortisone shots in her back and it has really helped. The patient will start pt on 09-16-2021. Medications reviewed. 04-29-2021: The patient takes Tylenol once a day. Was taking  it 3 times a day but has found a water soluble CBD drink and a CBD oil tincture that has been effective in helping with her pain control. She can tell a positive difference since using this product. 06-17-2021: The patient states she has heard back from [facility] and she has been approved.  She is so thankful and thinks this will be a huge help for changing her chronic pain.  She feels this will help her get back to the "land of the living". The patient states that her pain is getting worse and she knows she needs to address this. She was taking Tylenol 3 times a day but is using a CBD water soluble mixture that is helpful and a CBD oil tincture that has been very helpful with pain relief. She states that she wants to get better and be more active. Discussed safety concerns and the patient states that [name] is a huge support for her and assist her especially when she is going upstairs to the restroom. Denies any acute distress. Will continue to monitor. 08-19-2021: The patient is happy there is a plan in place. She is taking the medications as prescribed and working with the specialist. Can get cortisone shots every 3 months and is thankful for this. The patient is hopeful that PT will improve her mobility a lot.  Reviewed provider established plan for pain management. 06-17-2021: Review of safety and fall prevention. The patient denies any falls. States she is being careful and as an extra measure she has [name] go with her when she has to go upstairs to the bathroom. She is positive and hopeful that soon she will hear from charity care. 08-19-2021: The patient has gotten established with charity care specialist and is waiting for referrals for weight management and urology. She has an established plan and will follow up with the providers accordingly. Starts PT on 09-16-2021 and is thankful for this. She feels like she is doing well with getting her health on track and balance in her conditions.   Discussed  importance of adherence to all scheduled medical appointments. 06-17-2021: Encouraged the patient to set up and appointment with charity care at [place] to start getting the full benefit of the program and help her with her chronic conditions. 08-19-2021: Has established with charity care and has appointments in place. Counseled on the importance of reporting any/all new or changed pain symptoms or management strategies to pain management provider; Advised patient to report to care team affect of pain on daily activities; Discussed use of relaxation techniques and/or diversional activities to assist with pain reduction (distraction, imagery, relaxation, massage, acupressure, TENS, heat, and cold application; Reviewed with patient prescribed pharmacological and nonpharmacological pain relief strategies. 06-17-2021: Discussed with the patient pain relief measures.  Advised patient to discuss referral for assessment and treatment options  for chronic pain and discomfort  with provider;   Patient Goals/Self-Care Activities: Patient will self administer medications as prescribed Patient will attend all scheduled provider appointments Patient will call pharmacy for medication refills Patient will attend church or other social activities Patient will continue to perform ADL's independently Patient will continue to perform IADL's independently Patient will call provider office for new concerns or questions Patient will work with BSW to address care coordination needs and will continue to work with the clinical team to address health care and disease management related needs.   Patient will receive needed healthcare for chronic conditions and health and wellness maintainence    Our next appointment is by telephone on 10-21-2021 at 0945 am  Please call the care guide team at [phone] if you need to cancel or reschedule your appointment.   If you are experiencing a Mental Health or [place] or need someone to talk to, please call the Suicide and Crisis Lifeline: 988 call the [place] National Suicide Prevention Lifeline: 859-556-3039 or TTY: (780)667-8524 TTY 737-791-2596) to talk to a trained counselor call 1-800-273-TALK (toll free, 24 hour hotline)   Patient verbalizes understanding of instructions and care plan provided today and agrees to view in [place]. Active MyChart status confirmed with patient.    Telephone follow up appointment with care management team member scheduled for: 10-21-2021 at [place] am  Noreene Larsson RN, MSN, [place] Mobile: [phone]

## 2021-08-19 NOTE — Chronic Care Management (AMB) (Signed)
Care Management    RN Visit Note  08/19/2021 Name: Maria Jacobson MRN: [id number] DOB: 11-24-1975  Subjective: Maria Jacobson is a 46 y.o. year old female who is a primary care patient of Maria Hauser, DO. The care management team was consulted for assistance with disease management and care coordination needs.    Engaged with patient by telephone for follow up visit in response to provider referral for case management and/or care coordination services.   Consent to Services:   Maria Jacobson was given information about Care Management services today including:  Care Management services includes personalized support from designated clinical staff supervised by her physician, including individualized plan of care and coordination with other care providers 24/7 contact phone numbers for assistance for urgent and routine care needs. The patient may stop case management services at any time by phone call to the office staff.  Patient agreed to services and consent obtained.   Assessment: Review of patient past medical history, allergies, medications, health status, including review of consultants reports, laboratory and other test data, was performed as part of comprehensive evaluation and provision of chronic care management services.   SDOH (Social Determinants of Health) assessments and interventions performed:    Care Plan  Allergies  Allergen Reactions   Drug Ingredient [Black Walnut Pollen Allergy Skin Test]    Hazelnut ([place]) Allergy Skin Test    Pecan Extract Allergy Skin Test    Shellfish Allergy Hives    Outpatient Encounter Medications as of 08/19/2021  Medication Sig Note   acetaminophen (TYLENOL) 500 MG tablet Take 1,000 mg by mouth every 4 (four) hours as needed for headache.    albuterol (VENTOLIN HFA) 108 (90 Base) MCG/ACT inhaler INHALE TWO PUFFS BY MOUTH EVERY 4 HOURS AS NEEDED FOR WHEEZING OR FOR SHORTNESS OF BREATH OR FOR COUGH    ALPRAZolam (XANAX) 1  MG tablet Take 1 mg by mouth at bedtime as needed for anxiety.    amphetamine-dextroamphetamine (ADDERALL) 30 MG tablet Take 30 mg by mouth daily. 12/04/2019: QAM   ARIPiprazole (ABILIFY) 15 MG tablet Take 15 mg by mouth daily.    clonazePAM (KLONOPIN) 0.5 MG tablet Take 0.5 mg by mouth 5 (five) times daily.    cyclobenzaprine (FLEXERIL) 10 MG tablet Take 1 tablet (10 mg total) by mouth 3 (three) times daily as needed for muscle spasms.    fexofenadine (ALLEGRA) 180 MG tablet Take 180 mg by mouth daily as needed for allergies or rhinitis.    fluconazole (DIFLUCAN) 150 MG tablet Take one tablet by mouth on Day 1. Repeat dose 2nd tablet on Day 3.    furosemide (LASIX) 20 MG tablet TAKE 1 TABLET BY MOUTH EVERY DAY FOR SWELLING, MAY INCREASE TO 2 TABLETS IF NEEDED FOR MAX 7 DAYS PER FLARE    lamoTRIgine (LAMICTAL) 100 MG tablet Take 200 mg by mouth 2 (two) times daily. Taking 200 mg (2 tablets) each morning and 150 mg (1.5 tablets) each evening    melatonin 3 MG TABS tablet Take 3 mg by mouth at bedtime.    meloxicam (MOBIC) 15 MG tablet Take 1 tablet (15 mg total) by mouth daily.    omeprazole (PRILOSEC) 20 MG capsule Take 1 capsule (20 mg total) by mouth daily as needed.    Triamcinolone Acetonide (NASACORT ALLERGY 24HR NA) Place into the nose daily as needed.    No facility-administered encounter medications on file as of 08/19/2021.    Patient Active Problem List   Diagnosis Date Noted  Abnormal urine 05/01/2020   Abnormal urine odor 05/01/2020   Fatigue 05/01/2020   GERD (gastroesophageal reflux disease) 09/25/2019   Epidermal cyst of neck 06/13/2017   Atypical mole 10/05/2016   Bilateral lower extremity edema 06/15/2016   Pain and swelling of left lower leg 06/15/2016   Pain in joint, multiple sites 06/15/2016   Family history of rheumatoid arthritis 06/15/2016   Chronic bilateral low back pain with right-sided sciatica 05/24/2016   Left medial knee pain 05/23/2016   Anxiety 05/23/2016    Bipolar affective disorder, depressed, severe ([place]) 05/23/2016   Morbid obesity with BMI of 50.0-59.9, adult ([place]) 05/23/2016   Hyperlipidemia 05/23/2016    Conditions to be addressed/monitored: HLD, Anxiety, Depression, Bipolar Disorder, and Chronic pain  Care Plan : RNCM: Adult General plan of care: Chronic pain, anxiety, depression, bipolar and HLD  Updates made by Vanita Ingles, RN since 08/19/2021 12:00 AM     Problem: RNCM: Adult General plan of care: Chronic pain, anxiety, depression, bipolar and HLD   Priority: High  Onset Date: 03/18/2021     Long-Range Goal: RNCM: Adult General plan of care: Chronic pain, anxiety, depression, bipolar and HLD   Start Date: 03/18/2021  Expected End Date: 03/18/2022  Recent Progress: On track  Priority: High  Note:   Current Barriers:  Knowledge Deficits related to plan of care for management of HLD, Anxiety with Excessive Worry, Social Anxiety,, Depression: depressed mood, fatigue, hopelessness, anxiety, loss of energy/fatigue,, and Bipolar Disorder, and chronic pain   Care Coordination needs related to Financial constraints related to the ability to afford reliable and consistent healthcare for chronic conditions, Level of care concerns, and Mental Health Concerns   Chronic Disease Management support and education needs related to HLD, Anxiety with Excessive Worry, Social Anxiety,, Depression: depressed mood, fatigue, hopelessness, anxiety, loss of energy/fatigue,, and Bipolar Disorder, and chronic pain  Financial Constraints.  Non-adherence to scheduled provider appointments  RNCM Clinical Goal(s):  Patient will verbalize understanding of plan for management of HLD, Anxiety, Depression, Bipolar Disorder, and chronic pain  verbalize basic understanding of HLD, Anxiety, Depression, Bipolar Disorder, and chronic pain  disease process and self health management plan by working with the CCM team to aide and assist in finding affordable  health care to meet health and wellness goals take all medications exactly as prescribed and will call provider for medication related questions demonstrate understanding of rationale for each prescribed medication and take as directed  demonstrate improved and ongoing adherence to prescribed treatment plan for HLD, Anxiety, Depression, Bipolar Disorder, and chronic pain  as evidenced by adherence to prescribed medication regimen contacting provider for new or worsened symptoms or questions and obtaining charity care of [facility] to be able to help with chronic health conditions  demonstrate improved and ongoing health management independence by effective management of chronic conditions  continue to work with RN Care Manager to address care management and care coordination needs related to HLD, Anxiety, Depression, Bipolar Disorder, and chronic pain  work with Education officer, museum to address Financial constraints related to health care cost , Level of care concerns, and Mental Health Concerns  related to the management of HLD, Anxiety, Depression, Bipolar Disorder, and chronic pain  demonstrate a decrease in HLD, Anxiety, Depression, Bipolar Disorder, and chronic pain  exacerbations  demonstrate ongoing self health care management ability by effective management of chronic conditions through collaboration with RN Care manager, provider, and care team.   Interventions: 1:1 collaboration with primary care provider regarding development  and update of comprehensive plan of care as evidenced by provider attestation and co-signature Inter-disciplinary care team collaboration (see longitudinal plan of care) Evaluation of current treatment plan related to  self management and patient's adherence to plan as established by provider   SDOH Barriers (Status: Goal on track: YES.)  Patient interviewed and SDOH assessment performed        SDOH Interventions    Flowsheet Row Most Recent Value  SDOH Interventions    Physical Activity Interventions Other (Comments)  [limited mobility due to chronic pain]  Stress Interventions Other (Comment)  [has had a hard year but is ready to take ownership of her health now]  Social Connections Interventions Other (Comment)  [has great support from [place] her significant other]  Depression Interventions/Treatment  --  [name] to work with CCM team, sees psychiatry regularly, great support system]     Patient interviewed and appropriate assessments performed Provided patient with information about [facility] care and [facility], the patient has completed charity care paperwork and mailed recently, waiting to hear back from the approval process. 04-19-2021: Provided the patient with information about a letter being ready for pick up at the pcp office for the patient. 04-29-2021: The patient has received information from [facility] and they need additional documents. She feels once she submits the additional documents she will be able to get charity care. 06-17-2021: The patient has been accepted to [facility] at [facility] and is so thankful. Just got notification yesterday that she was accepted. 08-19-2021: Is working with specialist for management of chronic conditions. Has PT starting on 09-16-2021. Has seen ortho. Waiting on urology and weight management appointments Discussed plans with patient for ongoing care management follow up and provided patient with direct contact information for care management team Advised patient to work with CCM team, look at [facility] paperwork, and look for charity care information Collaborated with RN Case Manager re: effective management of chronic conditions and help with obtaining needed healthcare for chronic conditions.  Provided education to patient/caregiver regarding level of care options.    Anxiety, depression and bipolar  (Status: Goal on track: YES.) Evaluation of current treatment plan related to Anxiety, Depression, and Bipolar Disorder, Financial  constraints related to affordable healthcare , Level of care concerns, and Mental Health Concerns  self-management and patient's adherence to plan as established by provider. 08-19-2021: The patient is doing well and is currently at the beach for 5 weeks. Her significant other is working there and they are enjoying the time there. The patient states considering losing [name]'s mom on Christmas Eve they are doing well. They are able to go out on the beach and this is a time of reflexion. She is working on several things to get balance to her life and is optimistic about positive changes in her health and well being.  Discussed plans with patient for ongoing care management follow up and provided patient with direct contact information for care management team Advised patient to look at [place] paperwork and see if this is something she feels she would benefit from ; Provided education to patient re: [facility] and charity care and working with the CCM team to meet mental health needs and other chronic conditions management ; Reviewed medications with patient and discussed complinace. The patient went to [facility] today to pick up psychiatric medications. 08-19-2021: The patient is compliant with medications and gets her medications from [facility] in [place]. They have been working with her for several years on her medications and  effective management. Due to not living in the county they may not support her medications going forward. She is working with the LCSW to see about funding from [facility]; [place] with LCSW and pcp regarding patient completing charity care paperwork. 06-17-2021: Is working with the LCSW for ongoing support, education, and needs. 08-19-2021: Ongoing support and education for patient; Provided patient with [facility] and charity care educational materials related to assistance with meeting healthcare needs and receiveing care for chronic conditions. 04-29-2021: The patient has heard back from [facility] and needs additional documents. She is working on obtaining those documents to send back to charity care now. 06-17-2021: Accepted to charity care and found out yesterday. 08-19-2021: Is working with specialist to meet her needs Social Work referral for education and support for anxiety, depression, and bipolar disorder. 08-19-2021: The patient is actively working with the LCSW for ongoing support, education, and needs Discussed plans with patient for ongoing care management follow up and provided patient with direct contact information for care management team; Screening for signs and symptoms of depression related to chronic disease state. 04-29-2021: The patient is upbeat and staying positive. She states she and [name] are going to [employer] to see her son Pierre Bali who is a Equities trader and a drum major in the marching band. He is also on the homecoming court and may be king of the court. She is excited and proud of him. She wants to be healthy for herself, and her family. 06-17-2021: The patient is having a very good day today. She has been accepted to charity care and was so excited to tell the RNCM. She feels like this is going to put her in the "land of the living" again. 08-19-2021: The patient is doing well today. She is currently at the beach with her significant other and enjoying being there. She denies any acute distress. Empathetic listening and support given.  Assessed social determinant of health barriers;  Collaboration with the pcp after receiving information from the patient that she needed a letter documenting that she was physically unable to work by the pcp. The pcp has written the letter for the patient. Signed the letter and it is ready for pick up by the patient at the office. The patient corresponded with the RNCM through secure texting. The patient is very grateful for team support and collaboration in getting needed documents for the patient to have to turn in so that she may receive food  stamps. Will continue to monitor for changes. 04-29-2021: The patient has received help with food stamps and the needed documents. The patient is thankful for the support of the CCM team and pcp. Knows to contact the [facility] or LCSW for any new concerns or needs that may arise. Will continue to monitor for changes. 06-17-2021: The patient is so appreciative of all the help she is getting from the pcp and RNCM.  Health Maintenance (Status: Goal on track: YES.)  Patient interviewed about adult health maintenance status including Depression screen    Chronic pain management and support   Advised patient to discuss Depression screen    Chronic pain management  with primary care provider  Provided education about working with the CCM team for ongoing support and education for chronic conditions impacting the patients care 08-19-2021: Review of reoccurring UTI sx and sx. The patient saw the pcp on 07-27-2021 for UTI sx and sx. The patient states this has completely resolved and she is being proactive in monitoring for reoccurring  sx and sx. She is taking probiotics and drinking cranberry juice. Will continue to monitor for changes. Education on calling for changes in condition or worsening sx and sx of possible UTI.      Hyperlipidemia:  (Status: Goal on track: YES.) Lab Results  Component Value Date   CHOL 233 (H) 10/28/2019   HDL 50 10/28/2019   LDLCALC 153 (H) 10/28/2019   TRIG 165 (H) 10/28/2019   CHOLHDL 4.7 10/28/2019  04-29-2021: The patient has new lab work from outside source that she is going to supply to the office for records. States her cholesterol level is elevated. She is working on dietary changes.    Medication review performed; medication list updated in electronic medical record.  Provider established cholesterol goals reviewed. 04-29-2021: Review of cholesterol levels. The patient verbalized she has high levels. Will send paperwork from where she had blood work recently for medication  management  Counseled on importance of regular laboratory monitoring as prescribed. 04-29-2021: Had recent lab work from outside source. Will send a copy to the office for records. 08-19-2021: Unable to obtain labwork. Education and support given.  Provided HLD educational materials. 04-29-2021: Discussed healthy eating options and sent healthy eating booklet by email to the patient. She and [name] have changed their eating habits and are monitoring their dietary habits. 08-19-2021: Review of heart healthy/ADA diet ; Reviewed role and benefits of statin for ASCVD risk reduction; Reviewed importance of limiting foods high in cholesterol;  Pain:  (Status: Goal on track: YES.) Pain assessment performed. 04-29-2021: States her pain is getting worse but she is managing well. She has things in place and can get more care when she is approved for charity care. She states that this will be a big help for her. She is completing documents now. 08-19-2021: The patient rates her pain level today at a 6 or 7. She states it is much better than what it was and she is so thankful. She saw specialist with charity care and they gave her cortisone shots in her back and it has really helped. The patient will start pt on 09-16-2021. Medications reviewed. 04-29-2021: The patient takes Tylenol once a day. Was taking it 3 times a day but has found a water soluble CBD drink and a CBD oil tincture that has been effective in helping with her pain control. She can tell a positive difference since using this product. 06-17-2021: The patient states she has heard back from [facility] and she has been approved.  She is so thankful and thinks this will be a huge help for changing her chronic pain.  She feels this will help her get back to the "land of the living". The patient states that her pain is getting worse and she knows she needs to address this. She was taking Tylenol 3 times a day but is using a CBD water soluble mixture that is helpful  and a CBD oil tincture that has been very helpful with pain relief. She states that she wants to get better and be more active. Discussed safety concerns and the patient states that [name] is a huge support for her and assist her especially when she is going upstairs to the restroom. Denies any acute distress. Will continue to monitor. 08-19-2021: The patient is happy there is a plan in place. She is taking the medications as prescribed and working with the specialist. Can get cortisone shots every 3 months and is thankful for this. The patient is hopeful that PT  will improve her mobility a lot.  Reviewed provider established plan for pain management. 06-17-2021: Review of safety and fall prevention. The patient denies any falls. States she is being careful and as an extra measure she has [name] go with her when she has to go upstairs to the bathroom. She is positive and hopeful that soon she will hear from charity care. 08-19-2021: The patient has gotten established with charity care specialist and is waiting for referrals for weight management and urology. She has an established plan and will follow up with the providers accordingly. Starts PT on 09-16-2021 and is thankful for this. She feels like she is doing well with getting her health on track and balance in her conditions.   Discussed importance of adherence to all scheduled medical appointments. 06-17-2021: Encouraged the patient to set up and appointment with charity care at [facility] to start getting the full benefit of the program and help her with her chronic conditions. 08-19-2021: Has established with charity care and has appointments in place. Counseled on the importance of reporting any/all new or changed pain symptoms or management strategies to pain management provider; Advised patient to report to care team affect of pain on daily activities; Discussed use of relaxation techniques and/or diversional activities to assist with pain reduction  (distraction, imagery, relaxation, massage, acupressure, TENS, heat, and cold application; Reviewed with patient prescribed pharmacological and nonpharmacological pain relief strategies. 06-17-2021: Discussed with the patient pain relief measures.  Advised patient to discuss referral for assessment and treatment options for chronic pain and discomfort  with provider;  Patient Goals/Self-Care Activities: Patient will self administer medications as prescribed Patient will attend all scheduled provider appointments Patient will call pharmacy for medication refills Patient will attend church or other social activities Patient will continue to perform ADL's independently Patient will continue to perform IADL's independently Patient will call provider office for new concerns or questions Patient will work with BSW to address care coordination needs and will continue to work with the clinical team to address health care and disease management related needs.   Patient will receive needed healthcare for chronic conditions and health and wellness maintainence       Plan: Telephone follow up appointment with care management team member scheduled for:  10-21-2021 at [place] am  Noreene Larsson RN, MSN, [place] Mobile: [phone]

## 2021-10-21 ENCOUNTER — Telehealth: Payer: Self-pay

## 2021-10-21 ENCOUNTER — Ambulatory Visit: Payer: Self-pay

## 2021-10-21 DIAGNOSIS — M2550 Pain in unspecified joint: Secondary | ICD-10-CM

## 2021-10-21 DIAGNOSIS — E782 Mixed hyperlipidemia: Secondary | ICD-10-CM

## 2021-10-21 DIAGNOSIS — F419 Anxiety disorder, unspecified: Secondary | ICD-10-CM

## 2021-10-21 DIAGNOSIS — M25562 Pain in left knee: Secondary | ICD-10-CM

## 2021-10-21 DIAGNOSIS — F314 Bipolar disorder, current episode depressed, severe, without psychotic features: Secondary | ICD-10-CM

## 2021-10-21 DIAGNOSIS — G8929 Other chronic pain: Secondary | ICD-10-CM

## 2021-10-21 NOTE — Chronic Care Management (AMB) (Signed)
? Care Management ?  ? RN Visit Note ? ?10/21/2021 ?Name: Maria Jacobson MRN: [id number] DOB: 09/04/1975 ? ?Subjective: ?Maria Jacobson is a 46 y.o. year old female who is a primary care patient of [name], DO. The care management team was consulted for assistance with disease management and care coordination needs.   ? ?Engaged with patient by telephone for follow up visit in response to provider referral for case management and/or care coordination services.  ? ?Consent to Services:  ? [name] was given information about Care Management services today including:  ?Care Management services includes personalized support from designated clinical staff supervised by her physician, including individualized plan of care and coordination with other care providers ?24/7 contact phone numbers for assistance for urgent and routine care needs. ?The patient may stop case management services at any time by phone call to the office staff. ? ?Patient agreed to services and consent obtained.  ? ?Assessment: Review of patient past medical history, allergies, medications, health status, including review of consultants reports, laboratory and other test data, was performed as part of comprehensive evaluation and provision of chronic care management services.  ? ?SDOH (Social Determinants of Health) assessments and interventions performed:   ? ?Care Plan ? ?Allergies  ?Allergen Reactions  ? Drug Ingredient [Black Walnut Pollen Allergy Skin Test]   ? Hazelnut [name]) Allergy Skin Test   ? Pecan Extract Allergy Skin Test   ? Shellfish Allergy Hives  ? ? ?Outpatient Encounter Medications as of 10/21/2021  ?Medication Sig Note  ? acetaminophen (TYLENOL) 500 MG tablet Take 1,000 mg by mouth every 4 (four) hours as needed for headache.   ? albuterol (VENTOLIN HFA) 108 (90 Base) MCG/ACT inhaler INHALE TWO PUFFS BY MOUTH EVERY 4 HOURS AS NEEDED FOR WHEEZING OR FOR SHORTNESS OF BREATH OR FOR COUGH   ? ALPRAZolam (XANAX) 1  MG tablet Take 1 mg by mouth at bedtime as needed for anxiety.   ? amphetamine-dextroamphetamine (ADDERALL) 30 MG tablet Take 30 mg by mouth daily. 12/04/2019: QAM  ? ARIPiprazole (ABILIFY) 15 MG tablet Take 15 mg by mouth daily.   ? clonazePAM (KLONOPIN) 0.5 MG tablet Take 0.5 mg by mouth 5 (five) times daily.   ? cyclobenzaprine (FLEXERIL) 10 MG tablet Take 1 tablet (10 mg total) by mouth 3 (three) times daily as needed for muscle spasms.   ? fexofenadine (ALLEGRA) 180 MG tablet Take 180 mg by mouth daily as needed for allergies or rhinitis.   ? fluconazole (DIFLUCAN) 150 MG tablet Take one tablet by mouth on Day 1. Repeat dose 2nd tablet on Day 3.   ? furosemide (LASIX) 20 MG tablet TAKE 1 TABLET BY MOUTH EVERY DAY FOR SWELLING, MAY INCREASE TO 2 TABLETS IF NEEDED FOR MAX 7 DAYS PER FLARE   ? lamoTRIgine (LAMICTAL) 100 MG tablet Take 200 mg by mouth 2 (two) times daily. Taking 200 mg (2 tablets) each morning and 150 mg (1.5 tablets) each evening   ? melatonin 3 MG TABS tablet Take 3 mg by mouth at bedtime.   ? meloxicam (MOBIC) 15 MG tablet Take 1 tablet (15 mg total) by mouth daily.   ? omeprazole (PRILOSEC) 20 MG capsule Take 1 capsule (20 mg total) by mouth daily as needed.   ? Triamcinolone Acetonide (NASACORT ALLERGY 24HR NA) Place into the nose daily as needed.   ? ?No facility-administered encounter medications on file as of 10/21/2021.  ? ? ?Patient Active Problem List  ? Diagnosis Date Noted  ?  Abnormal urine 05/01/2020  ? Abnormal urine odor 05/01/2020  ? Fatigue 05/01/2020  ? GERD (gastroesophageal reflux disease) 09/25/2019  ? Epidermal cyst of neck 06/13/2017  ? Atypical mole 10/05/2016  ? Bilateral lower extremity edema 06/15/2016  ? Pain and swelling of left lower leg 06/15/2016  ? Pain in joint, multiple sites 06/15/2016  ? Family history of rheumatoid arthritis 06/15/2016  ? Chronic bilateral low back pain with right-sided sciatica 05/24/2016  ? Left medial knee pain 05/23/2016  ? Anxiety 05/23/2016   ? Bipolar affective disorder, depressed, severe ([place]) 05/23/2016  ? Morbid obesity with BMI of 50.0-59.9, adult ([place]) 05/23/2016  ? Hyperlipidemia 05/23/2016  ? ? ?Conditions to be addressed/monitored: HTN, Anxiety, Depression, Bipolar Disorder, and Chronic pain  ? ?Care Plan : RNCM: Adult General plan of care: Chronic pain, anxiety, depression, bipolar and HLD  ?Updates made by Vanita Ingles, RN since 10/21/2021 12:00 AM  ?  ? ?Problem: RNCM: Adult General plan of care: Chronic pain, anxiety, depression, bipolar and HLD   ?Priority: High  ?Onset Date: 03/18/2021  ?  ? ?Long-Range Goal: RNCM: Adult General plan of care: Chronic pain, anxiety, depression, bipolar and HLD   ?Start Date: 03/18/2021  ?Expected End Date: 03/18/2022  ?Recent Progress: On track  ?Priority: High  ?Note:   ?Current Barriers:  ?Knowledge Deficits related to plan of care for management of HLD, Anxiety with Excessive Worry, ?Social Anxiety,, Depression: depressed mood, ?fatigue, ?hopelessness, ?anxiety, ?loss of energy/fatigue,, and Bipolar Disorder, and chronic pain   ?Care Coordination needs related to Financial constraints related to the ability to afford reliable and consistent healthcare for chronic conditions, Level of care concerns, and Mental Health Concerns   ?Chronic Disease Management support and education needs related to HLD, Anxiety with Excessive Worry, ?Social Anxiety,, Depression: depressed mood, ?fatigue, ?hopelessness, ?anxiety, ?loss of energy/fatigue,, and Bipolar Disorder, and chronic pain  ?Film/video editor.  ?Non-adherence to scheduled provider appointments ? ?RNCM Clinical Goal(s):  ?Patient will verbalize understanding of plan for management of HLD, Anxiety, Depression, Bipolar Disorder, and chronic pain  ?verbalize basic understanding of HLD, Anxiety, Depression, Bipolar Disorder, and chronic pain  disease process and self health management plan by working with the CCM team to aide and assist in finding affordable  health care to meet health and wellness goals ?take all medications exactly as prescribed and will call provider for medication related questions ?demonstrate understanding of rationale for each prescribed medication and take as directed  ?demonstrate improved and ongoing adherence to prescribed treatment plan for HLD, Anxiety, Depression, Bipolar Disorder, and chronic pain  as evidenced by adherence to prescribed medication regimen contacting provider for new or worsened symptoms or questions and obtaining charity care of CAFA to be able to help with chronic health conditions  ?demonstrate improved and ongoing health management independence by effective management of chronic conditions  ?continue to work with RN Care Manager to address care management and care coordination needs related to HLD, Anxiety, Depression, Bipolar Disorder, and chronic pain  ?work with Education officer, museum to address Financial constraints related to health care cost , Level of care concerns, and Mental Health Concerns  related to the management of HLD, Anxiety, Depression, Bipolar Disorder, and chronic pain  ?demonstrate a decrease in HLD, Anxiety, Depression, Bipolar Disorder, and chronic pain  exacerbations  ?demonstrate ongoing self health care management ability by effective management of chronic conditions through collaboration with RN Care manager, provider, and care team.  ? ?Interventions: ?1:1 collaboration with primary care provider regarding  development and update of comprehensive plan of care as evidenced by provider attestation and co-signature ?Inter-disciplinary care team collaboration (see longitudinal plan of care) ?Evaluation of current treatment plan related to  self management and patient's adherence to plan as established by provider ? ? ?SDOH Barriers (Status: Goal on track: YES.)  ?Patient interviewed and SDOH assessment performed ?       ?SDOH Interventions   ? ?Flowsheet Row Most Recent Value  ?SDOH Interventions    ?Physical Activity Interventions Other (Comments)  [limited mobility due to chronic pain]  ?Stress Interventions Other (Comment)  [has had a hard year but is ready to take ownership of her health now]  ?Soc

## 2021-10-21 NOTE — Patient Instructions (Signed)
Visit Information ? ?Thank you for taking time to visit with me today. Please don't hesitate to contact me if I can be of assistance to you before our next scheduled telephone appointment. ? ?Following are the goals we discussed today:  ?RNCM Clinical Goal(s):  ?Patient will verbalize understanding of plan for management of HLD, Anxiety, Depression, Bipolar Disorder, and chronic pain  ?verbalize basic understanding of HLD, Anxiety, Depression, Bipolar Disorder, and chronic pain  disease process and self health management plan by working with the CCM team to aide and assist in finding affordable health care to meet health and wellness goals ?take all medications exactly as prescribed and will call provider for medication related questions ?demonstrate understanding of rationale for each prescribed medication and take as directed  ?demonstrate improved and ongoing adherence to prescribed treatment plan for HLD, Anxiety, Depression, Bipolar Disorder, and chronic pain  as evidenced by adherence to prescribed medication regimen contacting provider for new or worsened symptoms or questions and obtaining charity care of CAFA to be able to help with chronic health conditions  ?demonstrate improved and ongoing health management independence by effective management of chronic conditions  ?continue to work with RN Care Manager to address care management and care coordination needs related to HLD, Anxiety, Depression, Bipolar Disorder, and chronic pain  ?work with Education officer, museum to address Financial constraints related to health care cost , Level of care concerns, and Mental Health Concerns  related to the management of HLD, Anxiety, Depression, Bipolar Disorder, and chronic pain  ?demonstrate a decrease in HLD, Anxiety, Depression, Bipolar Disorder, and chronic pain  exacerbations  ?demonstrate ongoing self health care management ability by effective management of chronic conditions through collaboration with RN Care manager,  provider, and care team.  ?  ?Interventions: ?1:1 collaboration with primary care provider regarding development and update of comprehensive plan of care as evidenced by provider attestation and co-signature ?Inter-disciplinary care team collaboration (see longitudinal plan of care) ?Evaluation of current treatment plan related to  self management and patient's adherence to plan as established by provider ?  ?  ?SDOH Barriers (Status: Goal on track: YES.)  ?Patient interviewed and SDOH assessment performed ?       ?SDOH Interventions   ?  ?Flowsheet Row Most Recent Value  ?SDOH Interventions    ?Physical Activity Interventions Other (Comments)  [limited mobility due to chronic pain]  ?Stress Interventions Other (Comment)  [has had a hard year but is ready to take ownership of her health now]  ?Social Connections Interventions Other (Comment)  [has great support from [name] her significant other]  ?Depression Interventions/Treatment  --  Maria Jacobson to work with CCM team, sees psychiatry regularly, great support system]  ?  ?   ?Patient interviewed and appropriate assessments performed ?Provided patient with information about [facility] care and CAFA, the patient has completed charity care paperwork and mailed recently, waiting to hear back from the approval process. 04-19-2021: Provided the patient with information about a letter being ready for pick up at the pcp office for the patient. 04-29-2021: The patient has received information from [facility] and they need additional documents. She feels once she submits the additional documents she will be able to get charity care. 06-17-2021: The patient has been accepted to [facility] and is so thankful. Just got notification yesterday that she was accepted. 08-19-2021: Is working with specialist for management of chronic conditions. Has PT starting on 09-16-2021. Has seen ortho. Waiting on urology and weight management appointments.  10-21-2021: Has several upcoming  appointments to be seen and have further testing ?Discussed plans with patient for ongoing care management follow up and provided patient with direct contact information for care management team ?Advised patient to work with CCM team, look at [facility] paperwork, and look for charity care information ?Collaborated with RN Case Manager re: effective management of chronic conditions and help with obtaining needed healthcare for chronic conditions.  ?Provided education to patient/caregiver regarding level of care options. ?  ?  ?  ?Anxiety, depression and bipolar  (Status: Goal on track: YES.) ?Evaluation of current treatment plan related to Anxiety, Depression, and Bipolar Disorder, Financial constraints related to affordable healthcare , Level of care concerns, and Mental Health Concerns  self-management and patient's adherence to plan as established by provider. 08-19-2021: The patient is doing well and is currently at the beach [place] 5 weeks. Her significant other is working there and they are enjoying the time there. The patient states considering losing [name]'s mom on Christmas Eve they are doing well. They are able to go out on the beach and this is a time of reflexion. She is working on several things to get balance to her life and is optimistic about positive changes in her health and well being. 10-21-2021: The patient is doing well. They came back from the beach early as the patients grandmother got sick and died about 4 weeks ago. She is handling this well. She has been to her appointments and is progressing. She feels better and feels blessed to have this opportunity to help her with her health and well being. The patient states that she knows she has a lot of DDD but is working with PT and can tell a positive difference in her mobility. She is inspired to keep moving forward and following the plan of care for improving her health and well being.  ?Discussed plans with patient for ongoing care management follow up  and provided patient with direct contact information for care management team ?Advised patient to look at [place] paperwork and see if this is something she feels she would benefit from ; ?Provided education to patient re: CAFA and charity care and working with the CCM team to meet mental health needs and other chronic conditions management ; ?Reviewed medications with patient and discussed complinace. The patient went to [facility] today to pick up psychiatric medications. 08-19-2021: The patient is compliant with medications and gets her medications from [facility] in [place]. They have been working with her for several years on her medications and effective management. Due to not living in the county they may not support her medications going forward. She is working with the LCSW to see about funding from [place]. 10-21-2021: Has her medications and denies any acute issues with medications at this time.  ?Collaborated with LCSW and pcp regarding patient completing charity care paperwork. 06-17-2021: Is working with the LCSW for ongoing support, education, and needs. 08-19-2021: Ongoing support and education for patient. 10-21-2021: The patient continues to work with the LCSW for support and education.  ?Provided patient with CAFA and charity care educational materials related to assistance with meeting healthcare needs and receiveing care for chronic conditions. 04-29-2021: The patient has heard back from [facility] and needs additional documents. She is working on obtaining those documents to send back to charity care now. 06-17-2021: Accepted to charity care and found out yesterday. 10-21-2021: Is working with specialist to meet her needs ?Social Work referral for education and support  for anxiety, depression, and bipolar disorder. 10-21-2021: The patient is actively working with the LCSW for ongoing support, education, and needs ?Discussed plans with patient for ongoing care management follow up and provided  patient with direct contact information for care management team; ?Screening for signs and symptoms of depression related to chronic disease state. 04-29-2021: The patient is upbeat and staying positive. She states s

## 2021-11-16 ENCOUNTER — Ambulatory Visit: Payer: Medicaid Other | Admitting: Licensed Clinical Social Worker

## 2021-11-16 ENCOUNTER — Telehealth: Payer: Medicaid Other

## 2021-11-16 NOTE — Chronic Care Management (AMB) (Signed)
Care Management ?Clinical Social Work Note ? ?11/16/2021 ?Name: Maria Jacobson MRN: [id number] DOB: 10/30/1975 ? ?Maria Jacobson is a 46 y.o. year old female who is a primary care patient of Maria Hauser, DO.  The Care Management team was consulted for assistance with chronic disease management and coordination needs. ? ?Engaged with patient by telephone for follow up visit in response to provider referral for social work chronic care management and care coordination services ? ?Consent to Services:  ?Maria Jacobson was given information about Care Management services today including:  ?Care Management services includes personalized support from designated clinical staff supervised by her physician, including individualized plan of care and coordination with other care providers ?24/7 contact phone numbers for assistance for urgent and routine care needs. ?The patient may stop case management services at any time by phone call to the office staff. ? ?Patient agreed to services and consent obtained.  ? ?Assessment: Review of patient past medical history, allergies, medications, and health status, including review of relevant consultants reports was performed today as part of a comprehensive evaluation and provision of chronic care management and care coordination services. ? ?SDOH (Social Determinants of Health) assessments and interventions performed:   ? ?Advanced Directives Status: Not addressed in this encounter. ? ?Care Plan ? ?Allergies  ?Allergen Reactions  ? Drug Ingredient [Black Walnut Pollen Allergy Skin Test]   ? Hazelnut [name]) Allergy Skin Test   ? Pecan Extract Allergy Skin Test   ? Shellfish Allergy Hives  ? ? ?Outpatient Encounter Medications as of 11/16/2021  ?Medication Sig Note  ? acetaminophen (TYLENOL) 500 MG tablet Take 1,000 mg by mouth every 4 (four) hours as needed for headache.   ? albuterol (VENTOLIN HFA) 108 (90 Base) MCG/ACT inhaler INHALE TWO PUFFS BY MOUTH EVERY 4 HOURS AS  NEEDED FOR WHEEZING OR FOR SHORTNESS OF BREATH OR FOR COUGH   ? ALPRAZolam (XANAX) 1 MG tablet Take 1 mg by mouth at bedtime as needed for anxiety.   ? amphetamine-dextroamphetamine (ADDERALL) 30 MG tablet Take 30 mg by mouth daily. 12/04/2019: QAM  ? ARIPiprazole (ABILIFY) 15 MG tablet Take 15 mg by mouth daily.   ? clonazePAM (KLONOPIN) 0.5 MG tablet Take 0.5 mg by mouth 5 (five) times daily.   ? cyclobenzaprine (FLEXERIL) 10 MG tablet Take 1 tablet (10 mg total) by mouth 3 (three) times daily as needed for muscle spasms.   ? fexofenadine (ALLEGRA) 180 MG tablet Take 180 mg by mouth daily as needed for allergies or rhinitis.   ? fluconazole (DIFLUCAN) 150 MG tablet Take one tablet by mouth on Day 1. Repeat dose 2nd tablet on Day 3.   ? furosemide (LASIX) 20 MG tablet TAKE 1 TABLET BY MOUTH EVERY DAY FOR SWELLING, MAY INCREASE TO 2 TABLETS IF NEEDED FOR MAX 7 DAYS PER FLARE   ? lamoTRIgine (LAMICTAL) 100 MG tablet Take 200 mg by mouth 2 (two) times daily. Taking 200 mg (2 tablets) each morning and 150 mg (1.5 tablets) each evening   ? melatonin 3 MG TABS tablet Take 3 mg by mouth at bedtime.   ? meloxicam (MOBIC) 15 MG tablet Take 1 tablet (15 mg total) by mouth daily.   ? omeprazole (PRILOSEC) 20 MG capsule Take 1 capsule (20 mg total) by mouth daily as needed.   ? Triamcinolone Acetonide (NASACORT ALLERGY 24HR NA) Place into the nose daily as needed.   ? ?No facility-administered encounter medications on file as of 11/16/2021.  ? ? ?Patient Active Problem  List  ? Diagnosis Date Noted  ? Abnormal urine 05/01/2020  ? Abnormal urine odor 05/01/2020  ? Fatigue 05/01/2020  ? GERD (gastroesophageal reflux disease) 09/25/2019  ? Epidermal cyst of neck 06/13/2017  ? Atypical mole 10/05/2016  ? Bilateral lower extremity edema 06/15/2016  ? Pain and swelling of left lower leg 06/15/2016  ? Pain in joint, multiple sites 06/15/2016  ? Family history of rheumatoid arthritis 06/15/2016  ? Chronic bilateral low back pain with  right-sided sciatica 05/24/2016  ? Left medial knee pain 05/23/2016  ? Anxiety 05/23/2016  ? Bipolar affective disorder, depressed, severe ([place]) 05/23/2016  ? Morbid obesity with BMI of 50.0-59.9, adult ([place]) 05/23/2016  ? Hyperlipidemia 05/23/2016  ? ? ?Conditions to be addressed/monitored: Anxiety and Bipolar Disorder ? ?Care Plan : General Social Work (Adult)  ?Updates made by Maria Chesterfield, LCSW since 11/16/2021 12:00 AM  ?  ? ?Problem: Coping Skills (General Plan of Care)   ?  ? ?Long-Range Goal: Coping Skills Enhanced   ?Start Date: 03/31/2021  ?Expected End Date: 02/28/2022  ?This Visit's Progress: On track  ?Recent Progress: On track  ?Priority: High  ?Note:   ?Current barriers:   ?Severe Persistent Mental Health needs related to Bipolar Disorder and Anxiety ?Financial constraints related to medical coverage ?Needs Support, Education, and Care Coordination in order to meet unmet mental health needs. ?Clinical Goal(s): demonstrate a reduction in symptoms related to :Anxiety  and Bipolar Disorder  ?explore community resource options for unmet needs related to:No health insurance    ?Clinical Interventions:  ?Assessed patient's previous and current treatment, coping skills, support system and barriers to care  ?Patient is coping with the loss of her grandmother. Receives strong support and is not in need of grief support services currently. CCM LCSW provided validation and encouragement ?Patient continues to participate in medication management through [facility] in [facility] (approx. 20 years)  ?Patient is not interested in pursuing CAFA at this time. She has been awarded Public librarian through [facility] and will continue to pay out of pocket to visit with PCP ?Patient reports enjoying benefits of water therapy and in-office PT. She has noticed a decrease in pain ?Depression screen reviewed , Solution-Focused Strategies, Active listening / Reflection utilized , Emotional Supportive Provided, Participation in  counseling encouraged , Verbalization of feelings encouraged , and Suicidal Ideation/Homicidal Ideation assessed: Denies SI/HI  ?Inter-disciplinary care team collaboration (see longitudinal plan of care) ?Patient Goals/Self-Care Activities: Over the next 120 days ?Attend scheduled appointments with providers ?Contact clinic with any questions or concerns ?Continue with compliance of taking medication  ? ?  ?  ? ?Follow Up Plan: Appointment scheduled for SW follow up with client by phone on:  01/25/22 ? ?Christa See, MSW, LCSW ?[facility] Care Management ?[facility] ?[email or web address] ?Phone [phone] ?4:45 PM ? ?

## 2021-11-16 NOTE — Patient Instructions (Signed)
Visit Information ? ?Thank you for taking time to visit with me today. Please don't hesitate to contact me if I can be of assistance to you before our next scheduled telephone appointment. ? ?Following are the goals we discussed today:  ?Patient Goals/Self-Care Activities: Over the next 120 days ?Attend scheduled appointments with providers ?Contact clinic with any questions or concerns ?Continue with compliance of taking medication  ? ?Our next appointment is by telephone on 01/25/22 at 1:30 PM ? ?Please call the care guide team at [phone] if you need to cancel or reschedule your appointment.  ? ?If you are experiencing a Mental Health or [place] or need someone to talk to, please call the Suicide and Crisis Lifeline: 988  ? ?Patient verbalizes understanding of instructions and care plan provided today and agrees to view in [place]. Active MyChart status confirmed with patient.   ? ?Christa See, MSW, LCSW ?[facility] Care Management ?[place] [facility] ?[email or web address] ?Phone [phone] ?4:44 PM ?  ?

## 2021-12-06 ENCOUNTER — Ambulatory Visit: Payer: Self-pay

## 2021-12-06 ENCOUNTER — Telehealth: Payer: Self-pay

## 2021-12-06 DIAGNOSIS — M25562 Pain in left knee: Secondary | ICD-10-CM

## 2021-12-06 DIAGNOSIS — M2550 Pain in unspecified joint: Secondary | ICD-10-CM

## 2021-12-06 DIAGNOSIS — E782 Mixed hyperlipidemia: Secondary | ICD-10-CM

## 2021-12-06 DIAGNOSIS — F314 Bipolar disorder, current episode depressed, severe, without psychotic features: Secondary | ICD-10-CM

## 2021-12-06 DIAGNOSIS — F419 Anxiety disorder, unspecified: Secondary | ICD-10-CM

## 2021-12-06 DIAGNOSIS — G8929 Other chronic pain: Secondary | ICD-10-CM

## 2021-12-06 NOTE — Chronic Care Management (AMB) (Signed)
? Care Management ?  ? RN Visit Note ? ?12/06/2021 ?Name: Loa Idler MRN: [id number] DOB: 1975-08-11 ? ?Subjective: ?Carrye Goller is a 46 y.o. year old female who is a primary care patient of [name], DO. The care management team was consulted for assistance with disease management and care coordination needs.   ? ?Engaged with patient by telephone for follow up visit in response to provider referral for case management and/or care coordination services.  ? ?Consent to Services:  ? Ms. Cimino was given information about Care Management services today including:  ?Care Management services includes personalized support from designated clinical staff supervised by her physician, including individualized plan of care and coordination with other care providers ?24/7 contact phone numbers for assistance for urgent and routine care needs. ?The patient may stop case management services at any time by phone call to the office staff. ? ?Patient agreed to services and consent obtained.  ? ?Assessment: Review of patient past medical history, allergies, medications, health status, including review of consultants reports, laboratory and other test data, was performed as part of comprehensive evaluation and provision of chronic care management services.  ? ?SDOH (Social Determinants of Health) assessments and interventions performed:   ? ?Care Plan ? ?Allergies  ?Allergen Reactions  ? Drug Ingredient [Black Walnut Pollen Allergy Skin Test]   ? Hazelnut [name]) Allergy Skin Test   ? Pecan Extract Allergy Skin Test   ? Shellfish Allergy Hives  ? ? ?Outpatient Encounter Medications as of 12/06/2021  ?Medication Sig Note  ? celecoxib (CELEBREX) 100 MG capsule Take 100 mg by mouth 2 (two) times daily.   ? metFORMIN (GLUCOPHAGE) 500 MG tablet Take 500 mg by mouth 2 (two) times daily with a meal.   ? acetaminophen (TYLENOL) 500 MG tablet Take 1,000 mg by mouth every 4 (four) hours as needed for headache.   ? albuterol  (VENTOLIN HFA) 108 (90 Base) MCG/ACT inhaler INHALE TWO PUFFS BY MOUTH EVERY 4 HOURS AS NEEDED FOR WHEEZING OR FOR SHORTNESS OF BREATH OR FOR COUGH   ? ALPRAZolam (XANAX) 1 MG tablet Take 1 mg by mouth at bedtime as needed for anxiety.   ? amphetamine-dextroamphetamine (ADDERALL) 30 MG tablet Take 30 mg by mouth daily. 12/04/2019: QAM  ? ARIPiprazole (ABILIFY) 15 MG tablet Take 15 mg by mouth daily.   ? clonazePAM (KLONOPIN) 0.5 MG tablet Take 0.5 mg by mouth 5 (five) times daily.   ? cyclobenzaprine (FLEXERIL) 10 MG tablet Take 1 tablet (10 mg total) by mouth 3 (three) times daily as needed for muscle spasms.   ? fexofenadine (ALLEGRA) 180 MG tablet Take 180 mg by mouth daily as needed for allergies or rhinitis.   ? fluconazole (DIFLUCAN) 150 MG tablet Take one tablet by mouth on Day 1. Repeat dose 2nd tablet on Day 3.   ? furosemide (LASIX) 20 MG tablet TAKE 1 TABLET BY MOUTH EVERY DAY FOR SWELLING, MAY INCREASE TO 2 TABLETS IF NEEDED FOR MAX 7 DAYS PER FLARE   ? lamoTRIgine (LAMICTAL) 100 MG tablet Take 200 mg by mouth 2 (two) times daily. Taking 200 mg (2 tablets) each morning and 150 mg (1.5 tablets) each evening   ? melatonin 3 MG TABS tablet Take 3 mg by mouth at bedtime.   ? meloxicam (MOBIC) 15 MG tablet Take 1 tablet (15 mg total) by mouth daily. (Patient not taking: Reported on 12/06/2021)   ? omeprazole (PRILOSEC) 20 MG capsule Take 1 capsule (20 mg total) by mouth  daily as needed.   ? Triamcinolone Acetonide (NASACORT ALLERGY 24HR NA) Place into the nose daily as needed.   ? ?No facility-administered encounter medications on file as of 12/06/2021.  ? ? ?Patient Active Problem List  ? Diagnosis Date Noted  ? Abnormal urine 05/01/2020  ? Abnormal urine odor 05/01/2020  ? Fatigue 05/01/2020  ? GERD (gastroesophageal reflux disease) 09/25/2019  ? Epidermal cyst of neck 06/13/2017  ? Atypical mole 10/05/2016  ? Bilateral lower extremity edema 06/15/2016  ? Pain and swelling of left lower leg 06/15/2016  ? Pain in  joint, multiple sites 06/15/2016  ? Family history of rheumatoid arthritis 06/15/2016  ? Chronic bilateral low back pain with right-sided sciatica 05/24/2016  ? Left medial knee pain 05/23/2016  ? Anxiety 05/23/2016  ? Bipolar affective disorder, depressed, severe ([place]) 05/23/2016  ? Morbid obesity with BMI of 50.0-59.9, adult ([employer]) 05/23/2016  ? Hyperlipidemia 05/23/2016  ? ? ?Conditions to be addressed/monitored: HLD, Anxiety, Depression, Bipolar Disorder, and Chronic pain and obesity ? ?Care Plan : RNCM: Adult General plan of care: Chronic pain, anxiety, depression, bipolar and HLD  ?Updates made by Vanita Ingles, RN since 12/06/2021 12:00 AM  ?  ? ?Problem: RNCM: Adult General plan of care: Chronic pain, anxiety, depression, bipolar and HLD   ?Priority: High  ?Onset Date: 03/18/2021  ?  ? ?Long-Range Goal: RNCM: Adult General plan of care: Chronic pain, anxiety, depression, bipolar and HLD   ?Start Date: 03/18/2021  ?Expected End Date: 03/18/2022  ?Recent Progress: On track  ?Priority: High  ?Note:   ?Current Barriers:  ?Knowledge Deficits related to plan of care for management of HLD, Anxiety with Excessive Worry, ?Social Anxiety,, Depression: depressed mood, ?fatigue, ?hopelessness, ?anxiety, ?loss of energy/fatigue,, and Bipolar Disorder, and chronic pain   ?Care Coordination needs related to Financial constraints related to the ability to afford reliable and consistent healthcare for chronic conditions, Level of care concerns, and Mental Health Concerns   ?Chronic Disease Management support and education needs related to HLD, Anxiety with Excessive Worry, ?Social Anxiety,, Depression: depressed mood, ?fatigue, ?hopelessness, ?anxiety, ?loss of energy/fatigue,, and Bipolar Disorder, and chronic pain  ?Film/video editor.  ?Non-adherence to scheduled provider appointments ? ?RNCM Clinical Goal(s):  ?Patient will verbalize understanding of plan for management of HLD, Anxiety, Depression, Bipolar Disorder, and  chronic pain  ?verbalize basic understanding of HLD, Anxiety, Depression, Bipolar Disorder, and chronic pain  disease process and self health management plan by working with the CCM team to aide and assist in finding affordable health care to meet health and wellness goals ?take all medications exactly as prescribed and will call provider for medication related questions ?demonstrate understanding of rationale for each prescribed medication and take as directed  ?demonstrate improved and ongoing adherence to prescribed treatment plan for HLD, Anxiety, Depression, Bipolar Disorder, and chronic pain  as evidenced by adherence to prescribed medication regimen contacting provider for new or worsened symptoms or questions and obtaining charity care of [facility] to be able to help with chronic health conditions  ?demonstrate improved and ongoing health management independence by effective management of chronic conditions  ?continue to work with RN Care Manager to address care management and care coordination needs related to HLD, Anxiety, Depression, Bipolar Disorder, and chronic pain  ?work with Education officer, museum to address Financial constraints related to health care cost , Level of care concerns, and Mental Health Concerns  related to the management of HLD, Anxiety, Depression, Bipolar Disorder, and chronic pain  ?demonstrate a  decrease in HLD, Anxiety, Depression, Bipolar Disorder, and chronic pain  exacerbations  ?demonstrate ongoing self health care management ability by effective management of chronic conditions through collaboration with RN Care manager, provider, and care team.  ? ?Interventions: ?1:1 collaboration with primary care provider regarding development and update of comprehensive plan of care as evidenced by provider attestation and co-signature ?Inter-disciplinary care team collaboration (see longitudinal plan of care) ?Evaluation of current treatment plan related to  self management and patient's adherence to  plan as established by provider ? ? ?SDOH Barriers (Status: Goal on track: YES.)  ?Patient interviewed and SDOH assessment performed ?       ?SDOH Interventions   ? ?Flowsheet Row Most Recent Value  ?

## 2021-12-06 NOTE — Patient Instructions (Signed)
Visit Information ? ?Thank you for taking time to visit with me today. Please don't hesitate to contact me if I can be of assistance to you before our next scheduled telephone appointment. ? ?Following are the goals we discussed today:  ?RNCM Clinical Goal(s):  ?Patient will verbalize understanding of plan for management of HLD, Anxiety, Depression, Bipolar Disorder, and chronic pain  ?verbalize basic understanding of HLD, Anxiety, Depression, Bipolar Disorder, and chronic pain  disease process and self health management plan by working with the CCM team to aide and assist in finding affordable health care to meet health and wellness goals ?take all medications exactly as prescribed and will call provider for medication related questions ?demonstrate understanding of rationale for each prescribed medication and take as directed  ?demonstrate improved and ongoing adherence to prescribed treatment plan for HLD, Anxiety, Depression, Bipolar Disorder, and chronic pain  as evidenced by adherence to prescribed medication regimen contacting provider for new or worsened symptoms or questions and obtaining charity care of [facility] to be able to help with chronic health conditions  ?demonstrate improved and ongoing health management independence by effective management of chronic conditions  ?continue to work with RN Care Manager to address care management and care coordination needs related to HLD, Anxiety, Depression, Bipolar Disorder, and chronic pain  ?work with Education officer, museum to address Financial constraints related to health care cost , Level of care concerns, and Mental Health Concerns  related to the management of HLD, Anxiety, Depression, Bipolar Disorder, and chronic pain  ?demonstrate a decrease in HLD, Anxiety, Depression, Bipolar Disorder, and chronic pain  exacerbations  ?demonstrate ongoing self health care management ability by effective management of chronic conditions through collaboration with RN Care manager,  provider, and care team.  ?  ?Interventions: ?1:1 collaboration with primary care provider regarding development and update of comprehensive plan of care as evidenced by provider attestation and co-signature ?Inter-disciplinary care team collaboration (see longitudinal plan of care) ?Evaluation of current treatment plan related to  self management and patient's adherence to plan as established by provider ?  ?  ?SDOH Barriers (Status: Goal on track: YES.)  ?Patient interviewed and SDOH assessment performed ?       ?SDOH Interventions   ?  ?Flowsheet Row Most Recent Value  ?SDOH Interventions    ?Physical Activity Interventions Other (Comments)  [limited mobility due to chronic pain]  ?Stress Interventions Other (Comment)  [has had a hard year but is ready to take ownership of her health now]  ?Social Connections Interventions Other (Comment)  [has great support from [place] her significant other]  ?Depression Interventions/Treatment  --  [name] to work with CCM team, sees psychiatry regularly, great support system]  ?  ?   ?Patient interviewed and appropriate assessments performed ?Provided patient with information about [facility] care and CA[facility], the patient has completed charity care paperwork and mailed recently, waiting to hear back from the approval process. 04-19-2021: Provided the patient with information about a letter being ready for pick up at the pcp office for the patient. 04-29-2021: The patient has received information from [facility] and they need additional documents. She feels once she submits the additional documents she will be able to get charity care. 06-17-2021: The patient has been accepted to [facility] and is so thankful. Just got notification yesterday that she was accepted. 08-19-2021: Is working with specialist for management of chronic conditions. Has PT starting on 09-16-2021. Has seen ortho. Waiting on urology and weight management appointments.  10-21-2021: Has several upcoming  appointments to be seen and have further testing ?Discussed plans with patient for ongoing care management follow up and provided patient with direct contact information for care management team ?Advised patient to work with CCM team, look at [facility] paperwork, and look for charity care information ?Collaborated with RN Case Manager re: effective management of chronic conditions and help with obtaining needed healthcare for chronic conditions.  ?Provided education to patient/caregiver regarding level of care options. ?  ?  ?  ?Anxiety, depression and bipolar  (Status: Goal on track: YES.) ?Evaluation of current treatment plan related to Anxiety, Depression, and Bipolar Disorder, Financial constraints related to affordable healthcare , Level of care concerns, and Mental Health Concerns  self-management and patient's adherence to plan as established by provider. 08-19-2021: The patient is doing well and [place] at the beach [place] 5 [place]. Her significant other is working there and they are enjoying the time there. The patient states considering losing [name]'s mom on Christmas Eve they are doing well. They are able to go out on the beach and this is a time of reflexion. She is working on several things to get balance to her life and is optimistic about positive changes in her health and well being. 10-21-2021: The patient is doing well. They came back from the beach early as the patients grandmother got sick and died about 4 weeks ago. She is handling this well. She has been to her appointments and is progressing. She feels better and feels blessed to have this opportunity to help her with her health and well being. The patient states that she knows she has a lot of DDD but is working with PT and can tell a positive difference in her mobility. She is inspired to keep moving forward and following the plan of care for improving her health and well being. 12-06-2021: The patient is doing well and denies any acute findings  today. The patient is going to [facility] a lot and seeing her son and her dad. She says this has been very helpful for her and she is happy to see her son in his concerts. She has gotten in with weight management and is making healthy choices with eating and following recommendations. She is thankful for the support that she receives. Will continue to monitor for changes.  ?Discussed plans with patient for ongoing care management follow up and provided patient with direct contact information for care management team ?Advised patient to look at [place] paperwork and see if this is something she feels she would benefit from ; ?Provided education to patient re: CAFA and charity care and working with the CCM team to meet mental health needs and other chronic conditions management ; ?Reviewed medications with patient and discussed complinace. The patient went to [facility] today to pick up psychiatric medications. 08-19-2021: The patient is compliant with medications and gets her medications from [facility] in [place]. They have been working with her for several years on her medications and effective management. Due to not living in the county they may not support her medications going forward. She is working with the LCSW to see about funding from [place]. 12-06-2021: Has her medications and denies any acute issues with medications at this time.  ?Collaborated with LCSW and pcp regarding patient completing charity care paperwork. 06-17-2021: Is working with the LCSW for ongoing support, education, and needs. 08-19-2021: Ongoing support and education for patient. 12-06-2021: The patient continues to work with the  LCSW for support and education.  ?Provided patient with CAFA and charity care educational materials related to assistance with meeting healthcare needs and receiveing care for chronic conditions. 04-29-2021: The patient has heard back from [facility] and needs additional documents. She is working on obtaining  those documents to send back to charity care now. 06-17-2021: Accepted to charity care and found out yesterday. 12-06-2021: Is working with specialist to meet her needs ?Social Work referral for education and support

## 2022-01-25 ENCOUNTER — Ambulatory Visit: Payer: Self-pay | Admitting: Licensed Clinical Social Worker

## 2022-01-27 NOTE — Chronic Care Management (AMB) (Signed)
    Clinical Social Work  Care Management   Phone Outreach    01/27/2022 Name: Gwen Sarvis MRN: [id number] DOB: May 28, 1976  Deetya Drouillard is a 46 y.o. year old female who is a primary care patient of [name], DO .   Reason for referral: [place] and [facility].    F/U phone call today to assess needs, progress and barriers with care plan goals.   Patient was unable to keep phone appointment today and requested to reschedule.  Plan:Appointment was rescheduled with CCM LCSW on 02/22/22  Review of patient status, including review of consultants reports, relevant laboratory and other test results, and collaboration with appropriate care team members and the patient's provider was performed as part of comprehensive patient evaluation and provision of care management services.     Christa See, MSW, [facility] Care Management [email or web address] Phone [phone] 10:16 AM

## 2022-02-03 ENCOUNTER — Encounter: Payer: Self-pay | Admitting: Family Medicine

## 2022-02-03 ENCOUNTER — Ambulatory Visit: Payer: Self-pay | Admitting: Family Medicine

## 2022-02-03 VITALS — BP 156/90 | HR 85 | Ht 69.0 in | Wt 342.0 lb

## 2022-02-03 DIAGNOSIS — B379 Candidiasis, unspecified: Secondary | ICD-10-CM

## 2022-02-03 DIAGNOSIS — N390 Urinary tract infection, site not specified: Secondary | ICD-10-CM

## 2022-02-03 LAB — POCT URINALYSIS DIPSTICK
Bilirubin, UA: NEGATIVE
Glucose, UA: NEGATIVE
Ketones, UA: NEGATIVE
Nitrite, UA: NEGATIVE
Protein, UA: POSITIVE — AB
Spec Grav, UA: <=1.005 — AB (ref 1.010–1.025)
Urobilinogen, UA: 0.2 E.U./dL
pH, UA: 7 (ref 5.0–8.0)

## 2022-02-03 MED ORDER — FLUCONAZOLE 150 MG PO TABS: ORAL_TABLET | ORAL | 1 refills | Status: DC

## 2022-02-03 MED ORDER — SULFAMETHOXAZOLE-TRIMETHOPRIM 800-160 MG PO TABS: 1.0000 | ORAL_TABLET | Freq: Two times a day (BID) | ORAL | 0 refills | Status: AC

## 2022-02-03 NOTE — Patient Instructions (Addendum)
Thank you for coming to the office today.  1. You have a Urinary Tract Infection - this is very common, your symptoms are reassuring and you should get better within 1 week on the antibiotics - Start Bactrim twice a day for 10 days - We sent urine for a culture, we will call you within next few days if we need to change antibiotics - Please drink plenty of fluids, improve hydration over next 1 week  If symptoms worsening, developing nausea / vomiting, worsening back pain, fevers / chills / sweats, then please return for re-evaluation sooner.  If you take AZO OTC - limit this to 2-3 days MAX to avoid affecting kidneys  D-Mannose is a natural supplement that can actually help bind to urinary bacteria and reduce their effectiveness it can help prevent UTI from forming, and may reduce some symptoms. It likely cannot cure an active UTI but it is worth a try and good to prevent them with. Try '500mg'$  twice a day at a full dose if you want, or check package instructions for more info   Please schedule a Follow-up Appointment to: Return if symptoms worsen or fail to improve.  If you have any other questions or concerns, please feel free to call the office or send a message through [place]. You may also schedule an earlier appointment if necessary.  Additionally, you may be receiving a survey about your experience at our office within a few days to 1 week by e-mail or mail. We value your feedback.  [name], DO [place]

## 2022-02-03 NOTE — Progress Notes (Signed)
Subjective:    Patient ID: Maria Jacobson, female    DOB: 04/21/1976, 46 y.o.   MRN: [id number]  Maria Jacobson is a 46 y.o. female presenting on 02/03/2022 for Urinary Tract Infection   HPI  UTI Last UTI 07/2021 treated with bactrim with success, has done keflex before, macrobid incomplete results before. Reports now acute episode 3 weeks with some mild urinary odor, and dysuria. Has not seen urologist, was referred >6 months ago to [facility] but not scheduled.  She remains uninsured but has [facility] Coverage  Updates  Arthritis Knee Pain  [facility] Since 09/2021 with improvement.  [place] Following them for [facility] visit in August and Weight Management  [facility] Has received bilateral knee injections Right side improved Left is not improving as much.  [place] Will receive facet block / injections upcoming      02/03/2022    9:00 AM 07/27/2021    1:23 PM 06/08/2021    9:51 AM  Depression screen PHQ 2/9  Decreased Interest '1 3 2  '$ Down, Depressed, Hopeless '1 3 2  '$ PHQ - 2 Score '2 6 4  '$ Altered sleeping '1 3 3  '$ Tired, decreased energy '1 3 2  '$ Change in appetite '1 3 2  '$ Feeling bad or failure about yourself  0 1 3  Trouble concentrating 0 3 3  Moving slowly or fidgety/restless 0 1 0  Suicidal thoughts 0 0 0  PHQ-9 Score '5 20 17  '$ Difficult doing work/chores Somewhat difficult Somewhat difficult Extremely dIfficult    Social History   Tobacco Use   Smoking status: Never   Smokeless tobacco: Never  Vaping Use   Vaping Use: Never used  Substance Use Topics   Alcohol use: Not Currently    Comment: rarely   Drug use: No    Review of Systems Per HPI unless specifically indicated above     Objective:    BP (!) 156/90   Pulse 85   Ht '5\' 9"'$  (1.753 m)   Wt (!) 342 lb (155.1 kg)   SpO2 98%   BMI 50.50 kg/m   Wt Readings from Last 3 Encounters:  02/03/22 (!) 342 lb (155.1 kg)  07/27/21 (!)  365 lb (165.6 kg)  06/08/21 (!) 360 lb 8 oz (163.5 kg)    Physical Exam Vitals and nursing note reviewed.  Constitutional:      General: She is not in acute distress.    Appearance: Normal appearance. She is well-developed. She is not diaphoretic.     Comments: Well-appearing, comfortable, cooperative  HENT:     Head: Normocephalic and atraumatic.  Eyes:     General:        Right eye: No discharge.        Left eye: No discharge.     Conjunctiva/sclera: Conjunctivae normal.  Cardiovascular:     Rate and Rhythm: Normal rate.  Pulmonary:     Effort: Pulmonary effort is normal.  Skin:    General: Skin is warm and dry.     Findings: No erythema or rash.  Neurological:     Mental Status: She is alert and oriented to person, place, and time.  Psychiatric:        Mood and Affect: Mood normal.        Behavior: Behavior normal.        Thought Content: Thought content normal.     Comments: Well groomed, good eye contact, normal  speech and thoughts    Results for orders placed or performed in visit on 02/03/22  POCT Urinalysis Dipstick  Result Value Ref Range   Color, UA Yellow    Clarity, UA Cloudy    Glucose, UA Negative Negative   Bilirubin, UA Negative    Ketones, UA Negative    Spec Grav, UA <=1.005 (A) 1.010 - 1.025   Blood, UA Trace    pH, UA 7.0 5.0 - 8.0   Protein, UA Positive (A) Negative   Urobilinogen, UA 0.2 0.2 or 1.0 E.U./dL   Nitrite, UA Negative    Leukocytes, UA Large (3+) (A) Negative   Appearance     Odor        Assessment & Plan:   Problem List Items Addressed This Visit   None Visit Diagnoses     Recurrent UTI    -  Primary   Relevant Medications   sulfamethoxazole-trimethoprim (BACTRIM DS) 800-160 MG tablet   fluconazole (DIFLUCAN) 150 MG tablet   Other Relevant Orders   POCT Urinalysis Dipstick (Completed)   Urine Culture   Ambulatory referral to Urology   Antibiotic-induced yeast infection       Relevant Medications    sulfamethoxazole-trimethoprim (BACTRIM DS) 800-160 MG tablet   fluconazole (DIFLUCAN) 150 MG tablet       Treat acute UTI today Has done better, but history of recurrence  Bactrim DS x 10 day Diflucan as back up if need for antibiotics yeast infection   Repeat referral to [facility]  Recurrent UTIs, has had some resistant bacteria in past. Unsure if other etiology contributing to her recurrent UTI, may need more structural / urodynamic evaluation in future. Consider prophylactic antibiotics      Orders Placed This Encounter  Procedures   Urine Culture   Ambulatory referral to Urology    Referral Priority:   Routine    Referral Type:   Consultation    Referral Reason:   Specialty Services Required    Requested Specialty:   Urology    Number of Visits Requested:   1   POCT Urinalysis Dipstick     Meds ordered this encounter  Medications   sulfamethoxazole-trimethoprim (BACTRIM DS) 800-160 MG tablet    Sig: Take 1 tablet by mouth 2 (two) times daily for 10 days.    Dispense:  20 tablet    Refill:  0   fluconazole (DIFLUCAN) 150 MG tablet    Sig: Take one tablet by mouth on Day 1. Repeat dose 2nd tablet on Day 3.    Dispense:  2 tablet    Refill:  1      Follow up plan: Return if symptoms worsen or fail to improve.   [name], [facility] 02/03/2022, 8:34 AM

## 2022-02-04 LAB — URINE CULTURE
MICRO NUMBER:: 13615346
SPECIMEN QUALITY:: ADEQUATE

## 2022-02-07 ENCOUNTER — Ambulatory Visit: Payer: Medicaid Other | Admitting: Family Medicine

## 2022-02-07 ENCOUNTER — Ambulatory Visit: Payer: Self-pay

## 2022-02-07 ENCOUNTER — Telehealth: Payer: Self-pay

## 2022-02-07 DIAGNOSIS — E782 Mixed hyperlipidemia: Secondary | ICD-10-CM

## 2022-02-07 DIAGNOSIS — F314 Bipolar disorder, current episode depressed, severe, without psychotic features: Secondary | ICD-10-CM

## 2022-02-07 DIAGNOSIS — N390 Urinary tract infection, site not specified: Secondary | ICD-10-CM

## 2022-02-07 DIAGNOSIS — F419 Anxiety disorder, unspecified: Secondary | ICD-10-CM

## 2022-02-07 DIAGNOSIS — G8929 Other chronic pain: Secondary | ICD-10-CM

## 2022-02-07 DIAGNOSIS — M25562 Pain in left knee: Secondary | ICD-10-CM

## 2022-02-07 NOTE — Patient Outreach (Signed)
[facility]) Care Management  02/07/2022  Maria Jacobson October 16, 1975 [id number]   Error in charting

## 2022-02-07 NOTE — Patient Instructions (Signed)
Visit Information  Thank you for taking time to visit with me today. Please don't hesitate to contact me if I can be of assistance to you before our next scheduled telephone appointment.  Following are the goals we discussed today:  Current Barriers: 02-07-2022: Goals met and care plan is being closed. The patient is currently being treated for another UTI. The patient states that she is feeling better today. She already has an appointment with [facility] urology. The patient is following the plan of care for effective management of UTI. The patient knows the plan of care for [facility] is completed and she knows to call for new questions or concerns. Has the number for the [facility] to call if needed.  Knowledge Deficits related to plan of care for management of HLD, Anxiety with Excessive Worry, Social Anxiety,, Depression: depressed mood, fatigue, hopelessness, anxiety, loss of energy/fatigue,, and Bipolar Disorder, and chronic pain   Care Coordination needs related to Financial constraints related to the ability to afford reliable and consistent healthcare for chronic conditions, Level of care concerns, and Mental Health Concerns   Chronic Disease Management support and education needs related to HLD, Anxiety with Excessive Worry, Social Anxiety,, Depression: depressed mood, fatigue, hopelessness, anxiety, loss of energy/fatigue,, and Bipolar Disorder, and chronic pain  Financial Constraints.  Non-adherence to scheduled provider appointments   RNCM Clinical Goal(s):  Patient will verbalize understanding of plan for management of HLD, Anxiety, Depression, Bipolar Disorder, and chronic pain  verbalize basic understanding of HLD, Anxiety, Depression, Bipolar Disorder, and chronic pain  disease process and self health management plan by working with the CCM team to aide and assist in finding affordable health care to meet health and wellness goals take all medications exactly as prescribed and will call  provider for medication related questions demonstrate understanding of rationale for each prescribed medication and take as directed  demonstrate improved and ongoing adherence to prescribed treatment plan for HLD, Anxiety, Depression, Bipolar Disorder, and chronic pain  as evidenced by adherence to prescribed medication regimen contacting provider for new or worsened symptoms or questions and obtaining charity care of [facility] to be able to help with chronic health conditions  demonstrate improved and ongoing health management independence by effective management of chronic conditions  continue to work with RN Care Manager to address care management and care coordination needs related to HLD, Anxiety, Depression, Bipolar Disorder, and chronic pain  work with Education officer, museum to address Financial constraints related to health care cost , Level of care concerns, and Mental Health Concerns  related to the management of HLD, Anxiety, Depression, Bipolar Disorder, and chronic pain  demonstrate a decrease in HLD, Anxiety, Depression, Bipolar Disorder, and chronic pain  exacerbations  demonstrate ongoing self health care management ability by effective management of chronic conditions through collaboration with RN Care manager, provider, and care team.    Interventions: 1:1 collaboration with primary care provider regarding development and update of comprehensive plan of care as evidenced by provider attestation and co-signature Inter-disciplinary care team collaboration (see longitudinal plan of care) Evaluation of current treatment plan related to  self management and patient's adherence to plan as established by provider     SDOH Barriers (Status: Goal Met.) 02-07-2022: Goals met and care plan is being closed  Patient interviewed and SDOH assessment performed        SDOH Interventions     Flowsheet Row Most Recent Value  SDOH Interventions    Physical Activity Interventions Other (Comments)  [limited  mobility  due to chronic pain]  Stress Interventions Other (Comment)  [has had a hard year but is ready to take ownership of her health now]  Social Connections Interventions Other (Comment)  [has great support from [name] her significant other]  Depression Interventions/Treatment  --  Maria Jacobson to work with CCM team, sees psychiatry regularly, great support system]       Patient interviewed and appropriate assessments performed Provided patient with information about [facility] care and CAFA, the patient has completed charity care paperwork and mailed recently, waiting to hear back from the approval process. 04-19-2021: Provided the patient with information about a letter being ready for pick up at the pcp office for the patient. 04-29-2021: The patient has received information from [facility] and they need additional documents. She feels once she submits the additional documents she will be able to get charity care. 06-17-2021: The patient has been accepted to [facility] and is so thankful. Just got notification yesterday that she was accepted. 08-19-2021: Is working with specialist for management of chronic conditions. Has PT starting on 09-16-2021. Has seen ortho. Waiting on urology and weight management appointments. 10-21-2021: Has several upcoming appointments to be seen and have further testing Discussed plans with patient for ongoing care management follow up and provided patient with direct contact information for care management team Advised patient to work with CCM team, look at [facility] paperwork, and look for charity care information Collaborated with RN Case Manager re: effective management of chronic conditions and help with obtaining needed healthcare for chronic conditions.  Provided education to patient/caregiver regarding level of care options.       Anxiety, depression and bipolar  (Status: Goal Met.)02-07-2022: Goals met and care plan is being closed  Evaluation of current treatment  plan related to Anxiety, Depression, and Bipolar Disorder, Financial constraints related to affordable healthcare , Level of care concerns, and Mental Health Concerns  self-management and patient's adherence to plan as established by provider. 08-19-2021: The patient is doing well and is currently at the beach for 5 weeks. Her significant other is working there and they are enjoying the time there. The patient states considering losing [name]'s mom on Christmas Eve they are doing well. They are able to go out on the beach and this is a time of reflexion. She is working on several things to get balance to her life and is optimistic about positive changes in her health and well being. 10-21-2021: The patient is doing well. They came back from the beach early as the patients grandmother got sick and died about 4 weeks ago. She is handling this well. She has been to her appointments and is progressing. She feels better and feels blessed to have this opportunity to help her with her health and well being. The patient states that she knows she has a lot of DDD but is working with PT and can tell a positive difference in her mobility. She is inspired to keep moving forward and following the plan of care for improving her health and well being. 12-06-2021: The patient is doing well and denies any acute findings today. The patient is going to [facility] a lot and seeing her son and her dad. She says this has been very helpful for her and she is happy to see her son in his concerts. She has gotten in with weight management and is making healthy choices with eating and following recommendations. She is thankful for the support that she receives. Will continue  to monitor for changes. 02-07-2022: The patien tis doing well and denies any new concerns related to her anxiety, depression, or bipolar. The patient was out of her medications but has them now. Denies any acute distress at this time. Discussed plans with patient for ongoing  care management follow up and provided patient with direct contact information for care management team Advised patient to look at [place] paperwork and see if this is something she feels she would benefit from ; Provided education to patient re: CAFA and charity care and working with the CCM team to meet mental health needs and other chronic conditions management ; Reviewed medications with patient and discussed complinace. The patient went to [facility] today to pick up psychiatric medications. 08-19-2021: The patient is compliant with medications and gets her medications from [facility] in [place]. They have been working with her for several years on her medications and effective management. Due to not living in the county they may not support her medications going forward. She is working with the LCSW to see about funding from [place]. 12-06-2021: Has her medications and denies any acute issues with medications at this time.  Collaborated with LCSW and pcp regarding patient completing charity care paperwork. 06-17-2021: Is working with the LCSW for ongoing support, education, and needs. 08-19-2021: Ongoing support and education for patient. 12-06-2021: The patient continues to work with the LCSW for support and education.  Provided patient with CAFA and charity care educational materials related to assistance with meeting healthcare needs and receiveing care for chronic conditions. 04-29-2021: The patient has heard back from [facility] and needs additional documents. She is working on obtaining those documents to send back to charity care now. 06-17-2021: Accepted to charity care and found out yesterday. 12-06-2021: Is working with specialist to meet her needs Social Work referral for education and support for anxiety, depression, and bipolar disorder. 12-06-2021: The patient is actively working with the LCSW for ongoing support, education, and needs Discussed plans with patient for ongoing care management follow  up and provided patient with direct contact information for care management team; Screening for signs and symptoms of depression related to chronic disease state. 04-29-2021: The patient is upbeat and staying positive. She states she and [name] are going to [employer] to see her son Pierre Bali who is a Equities trader and a drum major in the marching band. He is also on the homecoming court and may be king of the court. She is excited and proud of him. She wants to be healthy for herself, and her family. 06-17-2021: The patient is having a very good day today. She has been accepted to charity care and was so excited to tell the RNCM. She feels like this is going to put her in the "land of the living" again. 08-19-2021: The patient is doing well today. She is currently at the beach with her significant other and enjoying being there. She denies any acute distress. Empathetic listening and support given. 10-21-2021: The patient is doing well. Is home from the beach and happy to be home and working on improving her health and well being. She has several upcoming appointments. 12-06-2021: The patient states that she was not feeling as well over the weekend and thinks she may have had a virus. The patient did start some Metformin on Friday. Education on monitoring as Metformin has been known to cause some diarrhea. She will follow up with the weight management in 4 weeks. Will continue to monitor.  Assessed  social determinant of health barriers;  Collaboration with the pcp after receiving information from the patient that she needed a letter documenting that she was physically unable to work by the pcp. The pcp has written the letter for the patient. Signed the letter and it is ready for pick up by the patient at the office. The patient corresponded with the RNCM through secure texting. The patient is very grateful for team support and collaboration in getting needed documents for the patient to have to turn in so that she may receive  food stamps. Will continue to monitor for changes. 04-29-2021: The patient has received help with food stamps and the needed documents. The patient is thankful for the support of the CCM team and pcp. Knows to contact the [facility] or LCSW for any new concerns or needs that may arise. Will continue to monitor for changes. 10-21-2021: The patient is so appreciative of all the help she is getting from the pcp and CCM team.   Health Maintenance (Status: Goal Met.) 02-07-2022: Goals met and care plan is being closed  Patient interviewed about adult health maintenance status including Depression screen    Chronic pain management and support   Advised patient to discuss Depression screen    Chronic pain management  with primary care provider  Provided education about working with the CCM team for ongoing support and education for chronic conditions impacting the patients care 08-19-2021: Review of reoccurring UTI sx and sx. The patient saw the pcp on 07-27-2021 for UTI sx and sx. The patient states this has completely resolved and she is being proactive in monitoring for reoccurring sx and sx. She is taking probiotics and drinking cranberry juice. Will continue to monitor for changes. Education on calling for changes in condition or worsening sx and sx of possible UTI.           Hyperlipidemia:  (Status: Goal Met.)02-07-2022 Goals met and care plan is being closed       Lab Results  Component Value Date    CHOL 233 (H) 10/28/2019    HDL 50 10/28/2019    LDLCALC 153 (H) 10/28/2019    TRIG 165 (H) 10/28/2019    CHOLHDL 4.7 10/28/2019  04-29-2021: The patient has new lab work from outside source that she is going to supply to the office for records. States her cholesterol level is elevated. She is working on dietary changes.     Medication review performed; medication list updated in electronic medical record. 12-06-2021: The patient is compliant with heart healthy diet. Has made lifestyle changes and working  with weight management.  Provider established cholesterol goals reviewed. 04-29-2021: Review of cholesterol levels. The patient verbalized she has high levels. Will send paperwork from where she had blood work recently for medication management  Counseled on importance of regular laboratory monitoring as prescribed. 04-29-2021: Had recent lab work from outside source. Will send a copy to the office for records. 08-19-2021: Unable to obtain labwork. Education and support given.  Provided HLD educational materials. 04-29-2021: Discussed healthy eating options and sent healthy eating booklet by email to the patient. She and [name] have changed their eating habits and are monitoring their dietary habits. 12-06-2021: Review of heart healthy/ADA diet ; Reviewed role and benefits of statin for ASCVD risk reduction; Reviewed importance of limiting foods high in cholesterol;   Pain:  (Status: Goal Met.) 02-07-2022: Goals met and care plan is being closed  Pain assessment performed. 04-29-2021: States her pain is getting worse  but she is managing well. She has things in place and can get more care when she is approved for charity care. She states that this will be a big help for her. She is completing documents now. 08-19-2021: The patient rates her pain level today at a 6 or 7. She states it is much better than what it was and she is so thankful. She saw specialist with charity care and they gave her cortisone shots in her back and it has really helped. The patient will start pt on 09-16-2021. 10-21-2021: The patient states her back pain is at a 6 today and her left knee is at a 7 today. She is working with orthopedic and had PT yesterday. She is having an MRI next week. She feels she is progressing well. Is thankful for the specialist and getting in to charity care with [facility]. 12-06-2021: The patient is rating her pain in her left knee at a 6 today and states since doing therapy it seems to be getting worse. She will reach out to  her orthopedic provider today for recommendations. The patient rates her back pain at a 4 today and states it is generalized. 02-07-2022: Rates her pain level at a 6 today in her left knee. The patient has adequate pain medications and is following the plan of care. Medications reviewed. 04-29-2021: The patient takes Tylenol once a day. Was taking it 3 times a day but has found a water soluble CBD drink and a CBD oil tincture that has been effective in helping with her pain control. She can tell a positive difference since using this product. 06-17-2021: The patient states she has heard back from [facility] and she has been approved.  She is so thankful and thinks this will be a huge help for changing her chronic pain.  She feels this will help her get back to the "land of the living". The patient states that her pain is getting worse and she knows she needs to address this. She was taking Tylenol 3 times a day but is using a CBD water soluble mixture that is helpful and a CBD oil tincture that has been very helpful with pain relief. She states that she wants to get better and be more active. Discussed safety concerns and the patient states that [name] is a huge support for her and assist her especially when she is going upstairs to the restroom. Denies any acute distress. Will continue to monitor. 08-19-2021: The patient is happy there is a plan in place. She is taking the medications as prescribed and working with the specialist. Can get cortisone shots every 3 months and is thankful for this. The patient is hopeful that PT will improve her mobility a lot. 10-21-2021: The patient has mobic but has stopped taking the mobic and has started taking the Celebrex to see if this is more effective. She is working with her specialist to adjust medications. 12-06-2021: The patient is no longer taking the mobic but has Celebrex and it is working better for her. Those have been the only other medication changes.  Reviewed  provider established plan for pain management. 06-17-2021: Review of safety and fall prevention. The patient denies any falls. States she is being careful and as an extra measure she has [name] go with her when she has to go upstairs to the bathroom. She is positive and hopeful that soon she will hear from charity care. 08-19-2021: The patient has gotten established with charity care specialist and  is waiting for referrals for weight management and urology. She has an established plan and will follow up with the providers accordingly. Starts PT on 09-16-2021 and is thankful for this. She feels like she is doing well with getting her health on track and balance in her conditions.  12-06-2021: The patient is doing well and denies any falls or safety concerns. She feels the PT is helping her to have better mobility. She feels stronger and she also has a Corporate investment banker and it is working well for her. She was supposed to have aqua therapy today but since she was not feeling the best she cancelled this for today.  Discussed importance of adherence to all scheduled medical appointments. 06-17-2021: Encouraged the patient to set up and appointment with charity care at [place] to start getting the full benefit of the program and help her with her chronic conditions. 08-19-2021: Has established with charity care and has appointments in place. 12-06-2021: Is working consistently with [facility] and has several upcoming appointments.  Counseled on the importance of reporting any/all new or changed pain symptoms or management strategies to pain management provider; Advised patient to report to care team affect of pain on daily activities; Discussed use of relaxation techniques and/or diversional activities to assist with pain reduction (distraction, imagery, relaxation, massage, acupressure, TENS, heat, and cold application; Reviewed with patient prescribed pharmacological and nonpharmacological pain relief strategies. 12-06-2021:  Discussed with the patient pain relief measures.  Advised patient to discuss referral for assessment and treatment options for chronic pain and discomfort  with provider;     Weight Loss:  (Status: Goal Met.) 02-07-2022: The patient continues to work with the weight loss and meeting her goal weight. Denies any new concerns at this time. Care plan being closed Advised patient to discuss with primary care provider options regarding weight management. 12-06-2021: she is seeing the weight management clinic and will follow up with them again in 4 weeks. She sates that she is making healthy changes and feels like she is doing well. ;  Provided verbal and/or written education to patient re: provider recommended life style modifications;  Screening for signs and symptoms of depression;  Offered to connect patient with psychology or social work support for counseling and supportive care;  Reviewed recommended dietary changes: avoid fad diets, make small/incremental dietary and exercise changes, eat at the table and avoid eating in front of the TV, plan management of cravings, monitor snacking and cravings in food diary; Advised patient to discuss medication side effects and other medication options with provider. The patient is limited on medication options due to what charity care will cover. Education and support given.;    Patient Goals/Self-Care Activities: Patient will self administer medications as prescribed Patient will attend all scheduled provider appointments Patient will call pharmacy for medication refills Patient will attend church or other social activities Patient will continue to perform ADL's independently Patient will continue to perform IADL's independently Patient will call provider office for new concerns or questions Patient will work with BSW to address care coordination needs and will continue to work with the clinical team to address health care and disease management related needs.    Patient will receive needed healthcare for chronic conditions and health and wellness maintainence      No further follow up needed at this time. The patient knows to call the [facility] if new questions, concerns, or needs arise  Please call the care guide team at [phone] if you need to  schedule an appointment.   If you are experiencing a Mental Health or [place] or need someone to talk to, please call the Suicide and Crisis Lifeline: 988 call the [place] National Suicide Prevention Lifeline: 352-172-3434 or TTY: 203-158-6482 TTY (315) 439-8948) to talk to a trained counselor call 1-800-273-TALK (toll free, 24 hour hotline)   Patient verbalizes understanding of instructions and care plan provided today and agrees to view in [place]. Active MyChart status and patient understanding of how to access instructions and care plan via MyChart confirmed with patient.     No further follow up required: the patient has completed the plan of care. Knows to call the [facility] for changes or new concerns  Noreene Larsson RN, MSN, [facility] Mobile: [phone]

## 2022-02-07 NOTE — Chronic Care Management (AMB) (Signed)
Care Management    RN Visit Note  02/07/2022 Name: Maria Jacobson MRN: [id number] DOB: 15-Oct-1975  Subjective: Maria Jacobson is a 46 y.o. year old female who is a primary care patient of [name], DO. The care management team was consulted for assistance with disease management and care coordination needs.    Engaged with patient by telephone for follow up visit in response to provider referral for case management and/or care coordination services. Case closure at the patient has met goals and has completed the plan of care. Knows to call for new changes or concerns.  Consent to Services:   [name] was given information about Care Management services today including:  Care Management services includes personalized support from designated clinical staff supervised by her physician, including individualized plan of care and coordination with other care providers 24/7 contact phone numbers for assistance for urgent and routine care needs. The patient may stop case management services at any time by phone call to the office staff.  Patient agreed to services and consent obtained.   Assessment: Review of patient past medical history, allergies, medications, health status, including review of consultants reports, laboratory and other test data, was performed as part of comprehensive evaluation and provision of chronic care management services.   SDOH (Social Determinants of Health) assessments and interventions performed:    Care Plan  Allergies  Allergen Reactions   Drug Ingredient [Black Walnut Pollen Allergy Skin Test]    Hazelnut ([place]) Allergy Skin Test    Pecan Extract Allergy Skin Test    Shellfish Allergy Hives    Outpatient Encounter Medications as of 02/07/2022  Medication Sig Note   acetaminophen (TYLENOL) 500 MG tablet Take 1,000 mg by mouth every 4 (four) hours as needed for headache.    albuterol (VENTOLIN HFA) 108 (90 Base) MCG/ACT inhaler INHALE  TWO PUFFS BY MOUTH EVERY 4 HOURS AS NEEDED FOR WHEEZING OR FOR SHORTNESS OF BREATH OR FOR COUGH    ALPRAZolam (XANAX) 1 MG tablet Take 1 mg by mouth at bedtime as needed for anxiety.    amphetamine-dextroamphetamine (ADDERALL) 30 MG tablet Take 30 mg by mouth daily. 12/04/2019: QAM   ARIPiprazole (ABILIFY) 15 MG tablet Take 15 mg by mouth daily.    celecoxib (CELEBREX) 100 MG capsule Take 100 mg by mouth 2 (two) times daily.    clonazePAM (KLONOPIN) 0.5 MG tablet Take 0.5 mg by mouth 5 (five) times daily.    cyclobenzaprine (FLEXERIL) 10 MG tablet Take 1 tablet (10 mg total) by mouth 3 (three) times daily as needed for muscle spasms.    fexofenadine (ALLEGRA) 180 MG tablet Take 180 mg by mouth daily as needed for allergies or rhinitis.    fluconazole (DIFLUCAN) 150 MG tablet Take one tablet by mouth on Day 1. Repeat dose 2nd tablet on Day 3.    furosemide (LASIX) 20 MG tablet TAKE 1 TABLET BY MOUTH EVERY DAY FOR SWELLING, MAY INCREASE TO 2 TABLETS IF NEEDED FOR MAX 7 DAYS PER FLARE    lamoTRIgine (LAMICTAL) 100 MG tablet Take 200 mg by mouth 2 (two) times daily. Taking 200 mg (2 tablets) each morning and 150 mg (1.5 tablets) each evening    melatonin 3 MG TABS tablet Take 3 mg by mouth at bedtime.    meloxicam (MOBIC) 15 MG tablet Take 1 tablet (15 mg total) by mouth daily.    metFORMIN (GLUCOPHAGE) 500 MG tablet Take 500 mg by mouth 2 (two) times daily with a meal.  omeprazole (PRILOSEC) 20 MG capsule Take 1 capsule (20 mg total) by mouth daily as needed.    sulfamethoxazole-trimethoprim (BACTRIM DS) 800-160 MG tablet Take 1 tablet by mouth 2 (two) times daily for 10 days.    Triamcinolone Acetonide (NASACORT ALLERGY 24HR NA) Place into the nose daily as needed.    No facility-administered encounter medications on file as of 02/07/2022.    Patient Active Problem List   Diagnosis Date Noted   Abnormal urine 05/01/2020   Abnormal urine odor 05/01/2020   Fatigue 05/01/2020   GERD  (gastroesophageal reflux disease) 09/25/2019   Epidermal cyst of neck 06/13/2017   Atypical mole 10/05/2016   Bilateral lower extremity edema 06/15/2016   Pain and swelling of left lower leg 06/15/2016   Pain in joint, multiple sites 06/15/2016   Family history of rheumatoid arthritis 06/15/2016   Chronic bilateral low back pain with right-sided sciatica 05/24/2016   Left medial knee pain 05/23/2016   Anxiety 05/23/2016   Bipolar affective disorder, depressed, severe ([place]) 05/23/2016   Morbid obesity with BMI of 50.0-59.9, adult ([employer]) 05/23/2016   Hyperlipidemia 05/23/2016    Conditions to be addressed/monitored: HLD, Anxiety, Depression, Bipolar Disorder, and Chronic pain, weight loss, recurrent UTI's  Care Plan : RNCM: Adult General plan of care: Chronic pain, anxiety, depression, bipolar and HLD  Updates made by Vanita Ingles, RN since 02/07/2022 12:00 AM  Completed 02/07/2022   Problem: RNCM: Adult General plan of care: Chronic pain, anxiety, depression, bipolar and HLD Resolved 02/07/2022  Priority: High  Onset Date: 03/18/2021     Long-Range Goal: RNCM: Adult General plan of care: Chronic pain, anxiety, depression, bipolar and HLD Completed 02/07/2022  Start Date: 03/18/2021  Expected End Date: 03/18/2022  Recent Progress: On track  Priority: High  Note:   Current Barriers: 02-07-2022: Goals met and care plan is being closed. The patient is currently being treated for another UTI. The patient states that she is feeling better today. She already has an appointment with [facility] urology. The patient is following the plan of care for effective management of UTI. The patient knows the plan of care for [facility] is completed and she knows to call for new questions or concerns. Has the number for the [facility] to call if needed.  Knowledge Deficits related to plan of care for management of HLD, Anxiety with Excessive Worry, Social Anxiety,, Depression: depressed  mood, fatigue, hopelessness, anxiety, loss of energy/fatigue,, and Bipolar Disorder, and chronic pain   Care Coordination needs related to Financial constraints related to the ability to afford reliable and consistent healthcare for chronic conditions, Level of care concerns, and Mental Health Concerns   Chronic Disease Management support and education needs related to HLD, Anxiety with Excessive Worry, Social Anxiety,, Depression: depressed mood, fatigue, hopelessness, anxiety, loss of energy/fatigue,, and Bipolar Disorder, and chronic pain  Financial Constraints.  Non-adherence to scheduled provider appointments  RNCM Clinical Goal(s):  Patient will verbalize understanding of plan for management of HLD, Anxiety, Depression, Bipolar Disorder, and chronic pain  verbalize basic understanding of HLD, Anxiety, Depression, Bipolar Disorder, and chronic pain  disease process and self health management plan by working with the CCM team to aide and assist in finding affordable health care to meet health and wellness goals take all medications exactly as prescribed and will call provider for medication related questions demonstrate understanding of rationale for each prescribed medication and take as directed  demonstrate improved and ongoing adherence to prescribed treatment plan for HLD, Anxiety, Depression,  Bipolar Disorder, and chronic pain  as evidenced by adherence to prescribed medication regimen contacting provider for new or worsened symptoms or questions and obtaining charity care of [facility] to be able to help with chronic health conditions  demonstrate improved and ongoing health management independence by effective management of chronic conditions  continue to work with RN Care Manager to address care management and care coordination needs related to HLD, Anxiety, Depression, Bipolar Disorder, and chronic pain  work with Education officer, museum to address Financial constraints related to health care  cost , Level of care concerns, and Mental Health Concerns  related to the management of HLD, Anxiety, Depression, Bipolar Disorder, and chronic pain  demonstrate a decrease in HLD, Anxiety, Depression, Bipolar Disorder, and chronic pain  exacerbations  demonstrate ongoing self health care management ability by effective management of chronic conditions through collaboration with RN Care manager, provider, and care team.   Interventions: 1:1 collaboration with primary care provider regarding development and update of comprehensive plan of care as evidenced by provider attestation and co-signature Inter-disciplinary care team collaboration (see longitudinal plan of care) Evaluation of current treatment plan related to  self management and patient's adherence to plan as established by provider   SDOH Barriers (Status: Goal Met.) 02-07-2022: Goals met and care plan is being closed  Patient interviewed and SDOH assessment performed        SDOH Interventions    Flowsheet Row Most Recent Value  SDOH Interventions   Physical Activity Interventions Other (Comments)  [limited mobility due to chronic pain]  Stress Interventions Other (Comment)  [has had a hard year but is ready to take ownership of her health now]  Social Connections Interventions Other (Comment)  [has great support from [place] her significant other]  Depression Interventions/Treatment  --  [name] to work with CCM team, sees psychiatry regularly, great support system]     Patient interviewed and appropriate assessments performed Provided patient with information about [facility] care and [facility], the patient has completed charity care paperwork and mailed recently, waiting to hear back from the approval process. 04-19-2021: Provided the patient with information about a letter being ready for pick up at the pcp office for the patient. 04-29-2021: The patient has received information from [facility] and they need additional documents. She feels  once she submits the additional documents she will be able to get charity care. 06-17-2021: The patient has been accepted to [facility] and is so thankful. Just got notification yesterday that she was accepted. 08-19-2021: Is working with specialist for management of chronic conditions. Has PT starting on 09-16-2021. Has seen ortho. Waiting on urology and weight management appointments. 10-21-2021: Has several upcoming appointments to be seen and have further testing Discussed plans with patient for ongoing care management follow up and provided patient with direct contact information for care management team Advised patient to work with CCM team, look at [facility] paperwork, and look for charity care information Collaborated with RN Case Manager re: effective management of chronic conditions and help with obtaining needed healthcare for chronic conditions.  Provided education to patient/caregiver regarding level of care options.    Anxiety, depression and bipolar  (Status: Goal Met.)02-07-2022: Goals met and care plan is being closed  Evaluation of current treatment plan related to Anxiety, Depression, and Bipolar Disorder, Financial constraints related to affordable healthcare , Level of care concerns, and Mental Health Concerns  self-management and patient's adherence to plan as established by provider. 08-19-2021: The patient is doing well  and is currently at the beach for 5 weeks. Her significant other is working there and they are enjoying the time there. The patient states considering losing [name]'s mom on Christmas Eve they are doing well. They are able to go out on the beach and this is a time of reflexion. She is working on several things to get balance to her life and is optimistic about positive changes in her health and well being. 10-21-2021: The patient is doing well. They came back from the beach early as the patients grandmother got sick and died about 4 weeks ago. She is handling this well.  She has been to her appointments and is progressing. She feels better and feels blessed to have this opportunity to help her with her health and well being. The patient states that she knows she has a lot of DDD but is working with PT and can tell a positive difference in her mobility. She is inspired to keep moving forward and following the plan of care for improving her health and well being. 12-06-2021: The patient is doing well and denies any acute findings today. The patient is going to [facility] a lot and seeing her son and her dad. She says this has been very helpful for her and she is happy to see her son in his concerts. She has gotten in with weight management and is making healthy choices with eating and following recommendations. She is thankful for the support that she receives. Will continue to monitor for changes. 02-07-2022: The patien tis doing well and denies any new concerns related to her anxiety, depression, or bipolar. The patient was out of her medications but has them now. Denies any acute distress at this time. Discussed plans with patient for ongoing care management follow up and provided patient with direct contact information for care management team Advised patient to look at [place] paperwork and see if this is something she feels she would benefit from ; Provided education to patient re: [facility] and charity care and working with the CCM team to meet mental health needs and other chronic conditions management ; Reviewed medications with patient and discussed complinace. The patient went to [facility] today to pick up psychiatric medications. 08-19-2021: The patient is compliant with medications and gets her medications from [facility] in [place]. They have been working with her for several years on her medications and effective management. Due to not living in the county they may not support her medications going forward. She is working with the LCSW to see about funding from [place].  12-06-2021: Has her medications and denies any acute issues with medications at this time.  Collaborated with LCSW and pcp regarding patient completing charity care paperwork. 06-17-2021: Is working with the LCSW for ongoing support, education, and needs. 08-19-2021: Ongoing support and education for patient. 12-06-2021: The patient continues to work with the LCSW for support and education.  Provided patient with [facility] and charity care educational materials related to assistance with meeting healthcare needs and receiveing care for chronic conditions. 04-29-2021: The patient has heard back from [facility] and needs additional documents. She is working on obtaining those documents to send back to charity care now. 06-17-2021: Accepted to charity care and found out yesterday. 12-06-2021: Is working with specialist to meet her needs Social Work referral for education and support for anxiety, depression, and bipolar disorder. 12-06-2021: The patient is actively working with the LCSW for ongoing support, education, and needs Discussed plans with patient  for ongoing care management follow up and provided patient with direct contact information for care management team; Screening for signs and symptoms of depression related to chronic disease state. 04-29-2021: The patient is upbeat and staying positive. She states she and [name] are going to [employer] to see her son Pierre Bali who is a Equities trader and a drum major in the marching band. He is also on the homecoming court and may be king of the court. She is excited and proud of him. She wants to be healthy for herself, and her family. 06-17-2021: The patient is having a very good day today. She has been accepted to charity care and was so excited to tell the RNCM. She feels like this is going to put her in the "land of the living" again. 08-19-2021: The patient is doing well today. She is currently at the beach with her significant other and enjoying being there. She denies any acute  distress. Empathetic listening and support given. 10-21-2021: The patient is doing well. Is home from the beach and happy to be home and working on improving her health and well being. She has several upcoming appointments. 12-06-2021: The patient states that she was not feeling as well over the weekend and thinks she may have had a virus. The patient did start some Metformin on Friday. Education on monitoring as Metformin has been known to cause some diarrhea. She will follow up with the weight management in 4 weeks. Will continue to monitor.  Assessed social determinant of health barriers;  Collaboration with the pcp after receiving information from the patient that she needed a letter documenting that she was physically unable to work by the pcp. The pcp has written the letter for the patient. Signed the letter and it is ready for pick up by the patient at the office. The patient corresponded with the RNCM through secure texting. The patient is very grateful for team support and collaboration in getting needed documents for the patient to have to turn in so that she may receive food stamps. Will continue to monitor for changes. 04-29-2021: The patient has received help with food stamps and the needed documents. The patient is thankful for the support of the CCM team and pcp. Knows to contact the [facility] or LCSW for any new concerns or needs that may arise. Will continue to monitor for changes. 10-21-2021: The patient is so appreciative of all the help she is getting from the pcp and CCM team.  Health Maintenance (Status: Goal Met.) 02-07-2022: Goals met and care plan is being closed  Patient interviewed about adult health maintenance status including Depression screen    Chronic pain management and support   Advised patient to discuss Depression screen    Chronic pain management  with primary care provider  Provided education about working with the CCM team for ongoing support and education for chronic conditions  impacting the patients care 08-19-2021: Review of reoccurring UTI sx and sx. The patient saw the pcp on 07-27-2021 for UTI sx and sx. The patient states this has completely resolved and she is being proactive in monitoring for reoccurring sx and sx. She is taking probiotics and drinking cranberry juice. Will continue to monitor for changes. Education on calling for changes in condition or worsening sx and sx of possible UTI.      Hyperlipidemia:  (Status: Goal Met.)02-07-2022 Goals met and care plan is being closed  Lab Results  Component Value Date   CHOL 233 (H) 10/28/2019  HDL 50 10/28/2019   LDLCALC 153 (H) 10/28/2019   TRIG 165 (H) 10/28/2019   CHOLHDL 4.7 10/28/2019  04-29-2021: The patient has new lab work from outside source that she is going to supply to the office for records. States her cholesterol level is elevated. She is working on dietary changes.    Medication review performed; medication list updated in electronic medical record. 12-06-2021: The patient is compliant with heart healthy diet. Has made lifestyle changes and working with weight management.  Provider established cholesterol goals reviewed. 04-29-2021: Review of cholesterol levels. The patient verbalized she has high levels. Will send paperwork from where she had blood work recently for medication management  Counseled on importance of regular laboratory monitoring as prescribed. 04-29-2021: Had recent lab work from outside source. Will send a copy to the office for records. 08-19-2021: Unable to obtain labwork. Education and support given.  Provided HLD educational materials. 04-29-2021: Discussed healthy eating options and sent healthy eating booklet by email to the patient. She and [name] have changed their eating habits and are monitoring their dietary habits. 12-06-2021: Review of heart healthy/ADA diet ; Reviewed role and benefits of statin for ASCVD risk reduction; Reviewed importance of limiting foods high in  cholesterol;  Pain:  (Status: Goal Met.) 02-07-2022: Goals met and care plan is being closed  Pain assessment performed. 04-29-2021: States her pain is getting worse but she is managing well. She has things in place and can get more care when she is approved for charity care. She states that this will be a big help for her. She is completing documents now. 08-19-2021: The patient rates her pain level today at a 6 or 7. She states it is much better than what it was and she is so thankful. She saw specialist with charity care and they gave her cortisone shots in her back and it has really helped. The patient will start pt on 09-16-2021. 10-21-2021: The patient states her back pain is at a 6 today and her left knee is at a 7 today. She is working with orthopedic and had PT yesterday. She is having an MRI next week. She feels she is progressing well. Is thankful for the specialist and getting in to charity care with [facility]. 12-06-2021: The patient is rating her pain in her left knee at a 6 today and states since doing therapy it seems to be getting worse. She will reach out to her orthopedic provider today for recommendations. The patient rates her back pain at a 4 today and states it is generalized. 02-07-2022: Rates her pain level at a 6 today in her left knee. The patient has adequate pain medications and is following the plan of care. Medications reviewed. 04-29-2021: The patient takes Tylenol once a day. Was taking it 3 times a day but has found a water soluble CBD drink and a CBD oil tincture that has been effective in helping with her pain control. She can tell a positive difference since using this product. 06-17-2021: The patient states she has heard back from [facility] and she has been approved.  She is so thankful and thinks this will be a huge help for changing her chronic pain.  She feels this will help her get back to the "land of the living". The patient states that her pain is getting worse and she knows she  needs to address this. She was taking Tylenol 3 times a day but is using a CBD water soluble mixture that is  helpful and a CBD oil tincture that has been very helpful with pain relief. She states that she wants to get better and be more active. Discussed safety concerns and the patient states that [name] is a huge support for her and assist her especially when she is going upstairs to the restroom. Denies any acute distress. Will continue to monitor. 08-19-2021: The patient is happy there is a plan in place. She is taking the medications as prescribed and working with the specialist. Can get cortisone shots every 3 months and is thankful for this. The patient is hopeful that PT will improve her mobility a lot. 10-21-2021: The patient has mobic but has stopped taking the mobic and has started taking the Celebrex to see if this is more effective. She is working with her specialist to adjust medications. 12-06-2021: The patient is no longer taking the mobic but has Celebrex and it is working better for her. Those have been the only other medication changes.  Reviewed provider established plan for pain management. 06-17-2021: Review of safety and fall prevention. The patient denies any falls. States she is being careful and as an extra measure she has [name] go with her when she has to go upstairs to the bathroom. She is positive and hopeful that soon she will hear from charity care. 08-19-2021: The patient has gotten established with charity care specialist and is waiting for referrals for weight management and urology. She has an established plan and will follow up with the providers accordingly. Starts PT on 09-16-2021 and is thankful for this. She feels like she is doing well with getting her health on track and balance in her conditions.  12-06-2021: The patient is doing well and denies any falls or safety concerns. She feels the PT is helping her to have better mobility. She feels stronger and she also has a Corporate investment banker and it  is working well for her. She was supposed to have aqua therapy today but since she was not feeling the best she cancelled this for today.  Discussed importance of adherence to all scheduled medical appointments. 06-17-2021: Encouraged the patient to set up and appointment with charity care at [place] to start getting the full benefit of the program and help her with her chronic conditions. 08-19-2021: Has established with charity care and has appointments in place. 12-06-2021: Is working consistently with [facility] and has several upcoming appointments.  Counseled on the importance of reporting any/all new or changed pain symptoms or management strategies to pain management provider; Advised patient to report to care team affect of pain on daily activities; Discussed use of relaxation techniques and/or diversional activities to assist with pain reduction (distraction, imagery, relaxation, massage, acupressure, TENS, heat, and cold application; Reviewed with patient prescribed pharmacological and nonpharmacological pain relief strategies. 12-06-2021: Discussed with the patient pain relief measures.  Advised patient to discuss referral for assessment and treatment options for chronic pain and discomfort  with provider;   Weight Loss:  (Status: Goal Met.) 02-07-2022: The patient continues to work with the weight loss and meeting her goal weight. Denies any new concerns at this time. Care plan being closed Advised patient to discuss with primary care provider options regarding weight management. 12-06-2021: she is seeing the weight management clinic and will follow up with them again in 4 weeks. She sates that she is making healthy changes and feels like she is doing well. ;  Provided verbal and/or written education to patient re: provider recommended life style modifications;  Screening for signs and symptoms of depression;  Offered to connect patient with psychology or social work support for counseling and  supportive care;  Reviewed recommended dietary changes: avoid fad diets, make small/incremental dietary and exercise changes, eat at the table and avoid eating in front of the TV, plan management of cravings, monitor snacking and cravings in food diary; Advised patient to discuss medication side effects and other medication options with provider. The patient is limited on medication options due to what charity care will cover. Education and support given.;   Patient Goals/Self-Care Activities: Patient will self administer medications as prescribed Patient will attend all scheduled provider appointments Patient will call pharmacy for medication refills Patient will attend church or other social activities Patient will continue to perform ADL's independently Patient will continue to perform IADL's independently Patient will call provider office for new concerns or questions Patient will work with BSW to address care coordination needs and will continue to work with the clinical team to address health care and disease management related needs.   Patient will receive needed healthcare for chronic conditions and health and wellness maintainence       Plan: No further follow up required: patient has met the plan of care and the goals of care and plan of care have been closed. The patient has the RNCM number to call for new changes or concerns  Noreene Larsson RN, MSN, [facility] Mobile: [phone]

## 2022-02-14 ENCOUNTER — Encounter: Payer: Self-pay | Admitting: Family Medicine

## 2022-02-14 DIAGNOSIS — N390 Urinary tract infection, site not specified: Secondary | ICD-10-CM

## 2022-02-14 MED ORDER — CEPHALEXIN 500 MG PO CAPS: 500.0000 mg | ORAL_CAPSULE | Freq: Three times a day (TID) | ORAL | 0 refills | Status: DC

## 2022-02-22 ENCOUNTER — Ambulatory Visit: Payer: Self-pay | Admitting: Licensed Clinical Social Worker

## 2022-03-07 NOTE — Chronic Care Management (AMB) (Signed)
Care Management Clinical Social Work Note  03/07/2022 Name: Maria Jacobson MRN: [id number] DOB: 20-Jul-1976  Maria Jacobson is a 46 y.o. year old female who is a primary care patient of [name], DO.  The Care Management team was consulted for assistance with chronic disease management and coordination needs.  Engaged with patient by telephone for follow up visit in response to provider referral for social work chronic care management and care coordination services  Consent to Services:  Ms. Bickle was given information about Care Management services today including:  Care Management services includes personalized support from designated clinical staff supervised by her physician, including individualized plan of care and coordination with other care providers 24/7 contact phone numbers for assistance for urgent and routine care needs. The patient may stop case management services at any time by phone call to the office staff.  Patient agreed to services and consent obtained.   Assessment: Review of patient past medical history, allergies, medications, and health status, including review of relevant consultants reports was performed today as part of a comprehensive evaluation and provision of chronic care management and care coordination services.  SDOH (Social Determinants of Health) assessments and interventions performed:    Advanced Directives Status: Not addressed in this encounter.  Care Plan  Allergies  Allergen Reactions   Drug Ingredient [Black Walnut Pollen Allergy Skin Test]    Hazelnut ([place]) Allergy Skin Test    Pecan Extract Allergy Skin Test    Shellfish Allergy Hives    Outpatient Encounter Medications as of 02/22/2022  Medication Sig Note   acetaminophen (TYLENOL) 500 MG tablet Take 1,000 mg by mouth every 4 (four) hours as needed for headache.    albuterol (VENTOLIN HFA) 108 (90 Base) MCG/ACT inhaler INHALE TWO PUFFS BY MOUTH EVERY 4 HOURS AS NEEDED  FOR WHEEZING OR FOR SHORTNESS OF BREATH OR FOR COUGH    ALPRAZolam (XANAX) 1 MG tablet Take 1 mg by mouth at bedtime as needed for anxiety.    amphetamine-dextroamphetamine (ADDERALL) 30 MG tablet Take 30 mg by mouth daily. 12/04/2019: QAM   ARIPiprazole (ABILIFY) 15 MG tablet Take 15 mg by mouth daily.    celecoxib (CELEBREX) 100 MG capsule Take 100 mg by mouth 2 (two) times daily.    cephALEXin (KEFLEX) 500 MG capsule Take 1 capsule (500 mg total) by mouth 3 (three) times daily. For 7 days    clonazePAM (KLONOPIN) 0.5 MG tablet Take 0.5 mg by mouth 5 (five) times daily.    cyclobenzaprine (FLEXERIL) 10 MG tablet Take 1 tablet (10 mg total) by mouth 3 (three) times daily as needed for muscle spasms.    fexofenadine (ALLEGRA) 180 MG tablet Take 180 mg by mouth daily as needed for allergies or rhinitis.    fluconazole (DIFLUCAN) 150 MG tablet Take one tablet by mouth on Day 1. Repeat dose 2nd tablet on Day 3.    furosemide (LASIX) 20 MG tablet TAKE 1 TABLET BY MOUTH EVERY DAY FOR SWELLING, MAY INCREASE TO 2 TABLETS IF NEEDED FOR MAX 7 DAYS PER FLARE    lamoTRIgine (LAMICTAL) 100 MG tablet Take 200 mg by mouth 2 (two) times daily. Taking 200 mg (2 tablets) each morning and 150 mg (1.5 tablets) each evening    melatonin 3 MG TABS tablet Take 3 mg by mouth at bedtime.    meloxicam (MOBIC) 15 MG tablet Take 1 tablet (15 mg total) by mouth daily.    metFORMIN (GLUCOPHAGE) 500 MG tablet Take 500 mg by mouth  2 (two) times daily with a meal.    omeprazole (PRILOSEC) 20 MG capsule Take 1 capsule (20 mg total) by mouth daily as needed.    Triamcinolone Acetonide (NASACORT ALLERGY 24HR NA) Place into the nose daily as needed.    No facility-administered encounter medications on file as of 02/22/2022.    Patient Active Problem List   Diagnosis Date Noted   Abnormal urine 05/01/2020   Abnormal urine odor 05/01/2020   Fatigue 05/01/2020   GERD (gastroesophageal reflux disease) 09/25/2019   Epidermal cyst of  neck 06/13/2017   Atypical mole 10/05/2016   Bilateral lower extremity edema 06/15/2016   Pain and swelling of left lower leg 06/15/2016   Pain in joint, multiple sites 06/15/2016   Family history of rheumatoid arthritis 06/15/2016   Chronic bilateral low back pain with right-sided sciatica 05/24/2016   Left medial knee pain 05/23/2016   Anxiety 05/23/2016   Bipolar affective disorder, depressed, severe ([place]) 05/23/2016   Morbid obesity with BMI of 50.0-59.9, adult ([place]) 05/23/2016   Hyperlipidemia 05/23/2016    Conditions to be addressed/monitored: Anxiety, Depression, and Bipolar Disorder  Care Plan : General Social Work (Adult)  Updates made by Rebekah Chesterfield, LCSW since 03/07/2022 12:00 AM     Problem: Coping Skills (General Plan of Care)      Long-Range Goal: Coping Skills Enhanced Completed 02/22/2022  Start Date: 03/31/2021  Expected End Date: 02/28/2022  This Visit's Progress: On track  Recent Progress: On track  Priority: High  Note:   Current barriers:   Severe Persistent Mental Health needs related to Bipolar Disorder and Anxiety Financial constraints related to medical coverage Needs Support, Education, and Care Coordination in order to meet unmet mental health needs. Clinical Goal(s): demonstrate a reduction in symptoms related to :Anxiety  and Bipolar Disorder  explore community resource options for unmet needs related to:No health insurance    Clinical Interventions:  Assessed patient's previous and current treatment, coping skills, support system and barriers to care  Completed urology appt No disability needs Patient continues to participate in medication management through [facility] (approx. 20 years)  Patient is not interested in pursuing CAFA at this time. She has been awarded Public librarian through [facility] and will continue to pay out of pocket to visit with PCP Patient reports enjoying benefits of water therapy and in-office PT. She has  noticed a decrease in pain Depression screen reviewed , Solution-Focused Strategies, Active listening / Reflection utilized , Emotional Supportive Provided, Participation in counseling encouraged , Verbalization of feelings encouraged , and Suicidal Ideation/Homicidal Ideation assessed: Denies SI/HI  Inter-disciplinary care team collaboration (see longitudinal plan of care) Patient Goals/Self-Care Activities: Over the next 120 days Attend scheduled appointments with providers Contact clinic with any questions or concerns Continue with compliance of taking medication       Follow Up Plan: No follow up required  Christa See, MSW, [email or web address] Phone [phone] 8:08 AM

## 2022-03-07 NOTE — Patient Instructions (Signed)
Visit Information  Thank you for taking time to visit with me today. Please don't hesitate to contact me if I can be of assistance to you before our next scheduled telephone appointment.  Following are the goals we discussed today:  Patient Goals/Self-Care Activities: Over the next 120 days Attend scheduled appointments with providers Contact clinic with any questions or concerns Continue with compliance of taking medication   If you are experiencing a Mental Health or [place] or need someone to talk to, please call the Suicide and Crisis Lifeline: 988 call 911   Patient verbalizes understanding of instructions and care plan provided today and agrees to view in [place]. Active MyChart status and patient understanding of how to access instructions and care plan via MyChart confirmed with patient.     No follow up required  Christa See, MSW, [email or web address] Phone [phone] 8:07 AM

## 2022-03-28 IMAGING — MG DIGITAL SCREENING BILAT W/ TOMO W/ CAD
6 of 12 series · 6 of 36 positions shown · non-contrast
Comparison: Previous exam(s).

CLINICAL DATA: Screening.

EXAM:
DIGITAL SCREENING BILATERAL MAMMOGRAM WITH TOMO AND CAD

[R MLO synth-2D (1 of 2)]
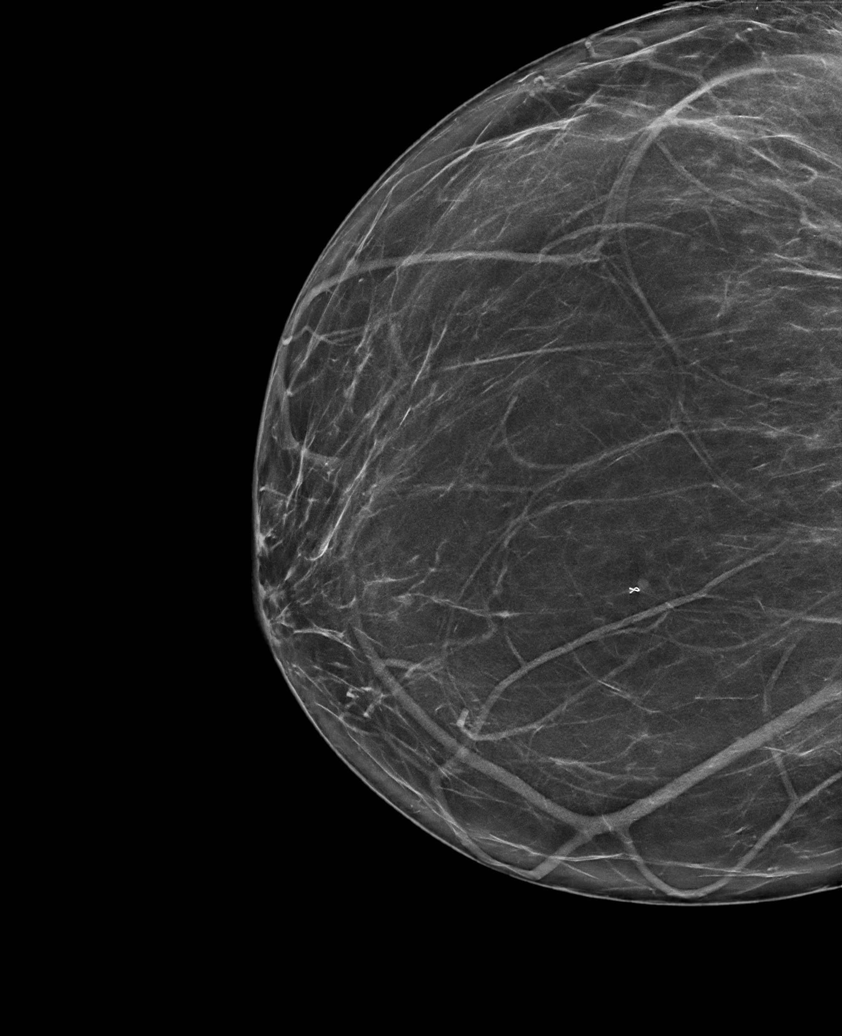

[L MLO synth-2D (1 of 2)]
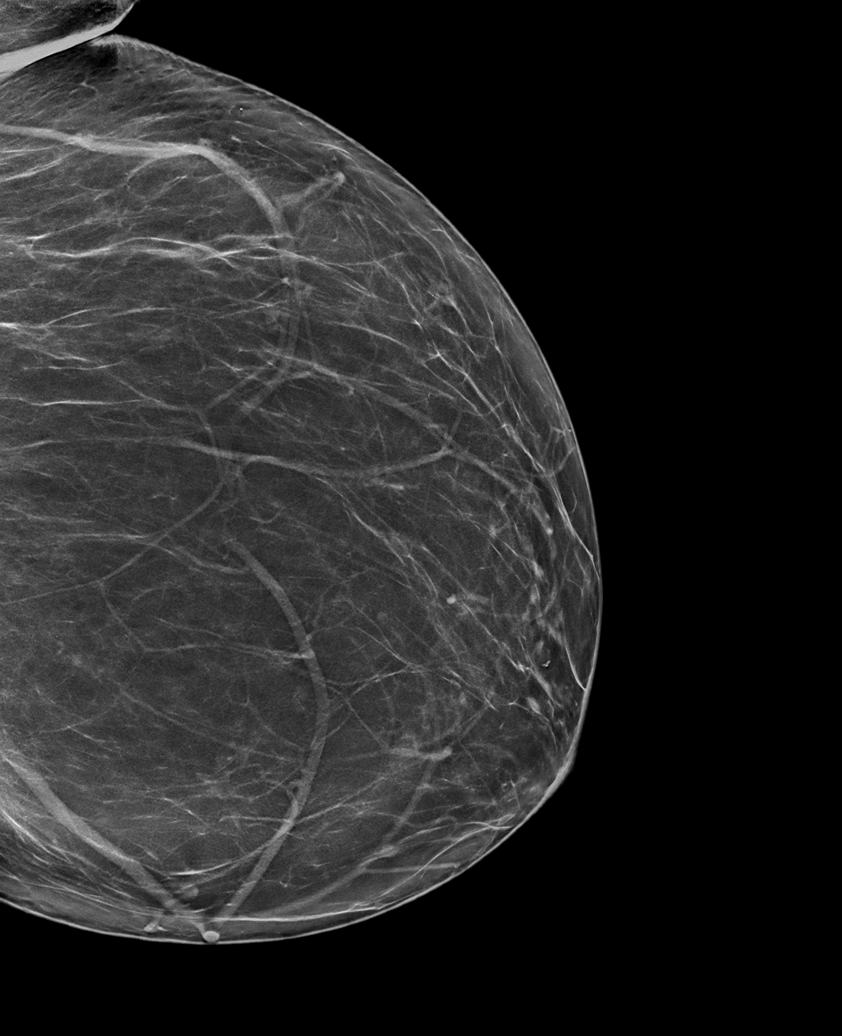

[R MLO synth-2D (2 of 2)]
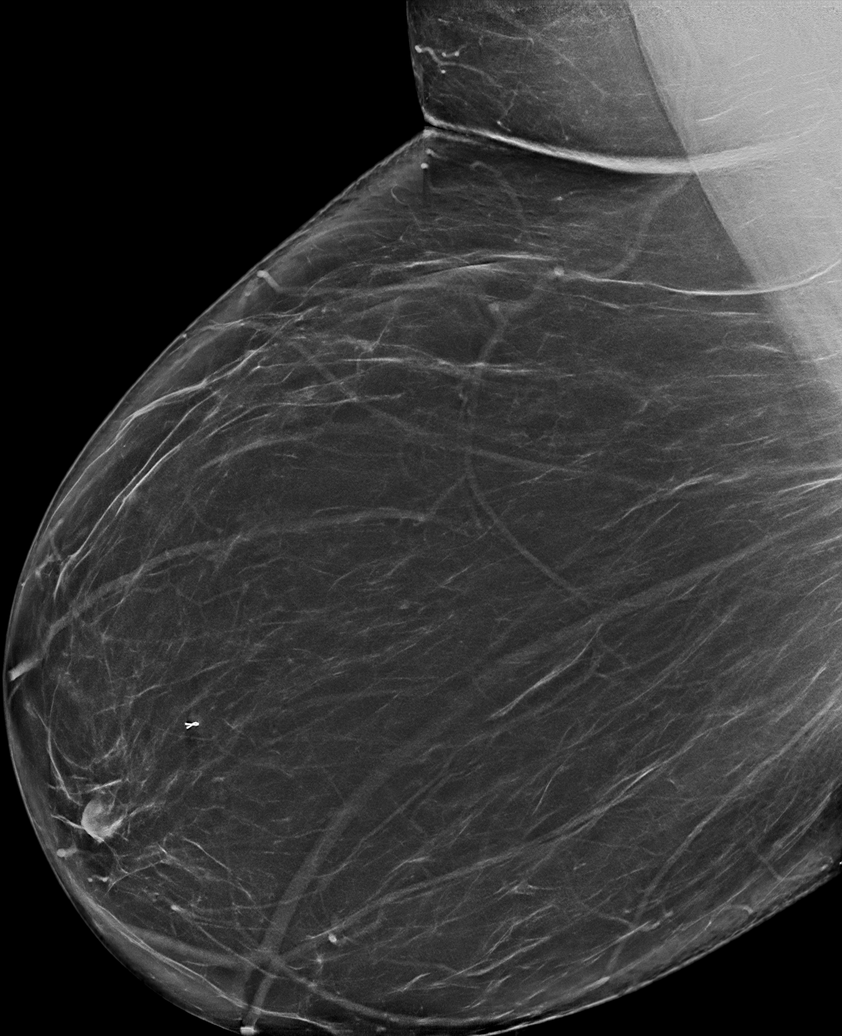

[L MLO synth-2D (2 of 2)]
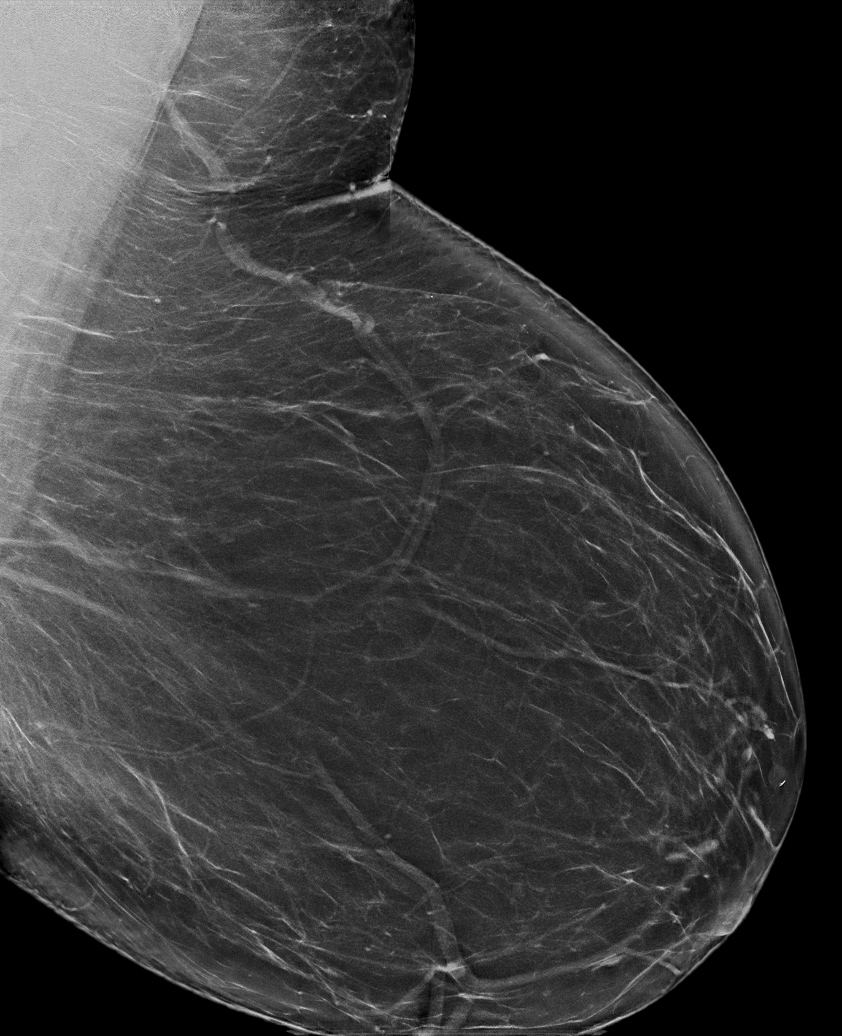

[R CC synth-2D]
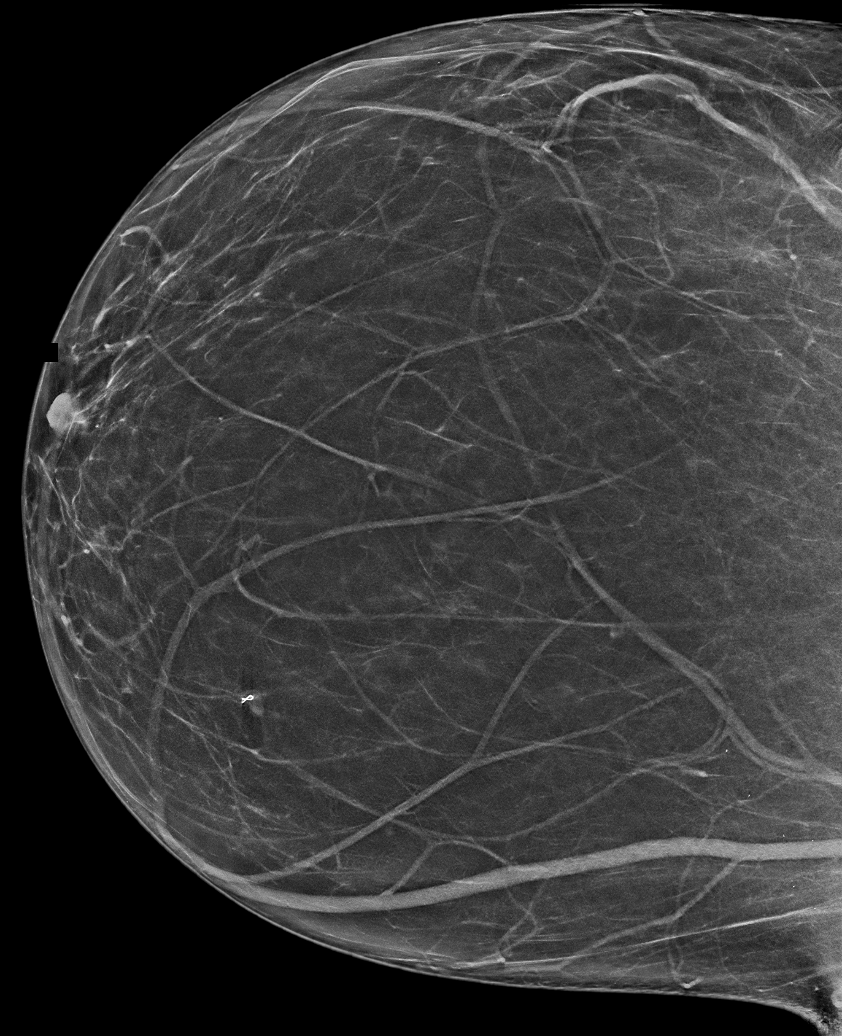

[L CC synth-2D]
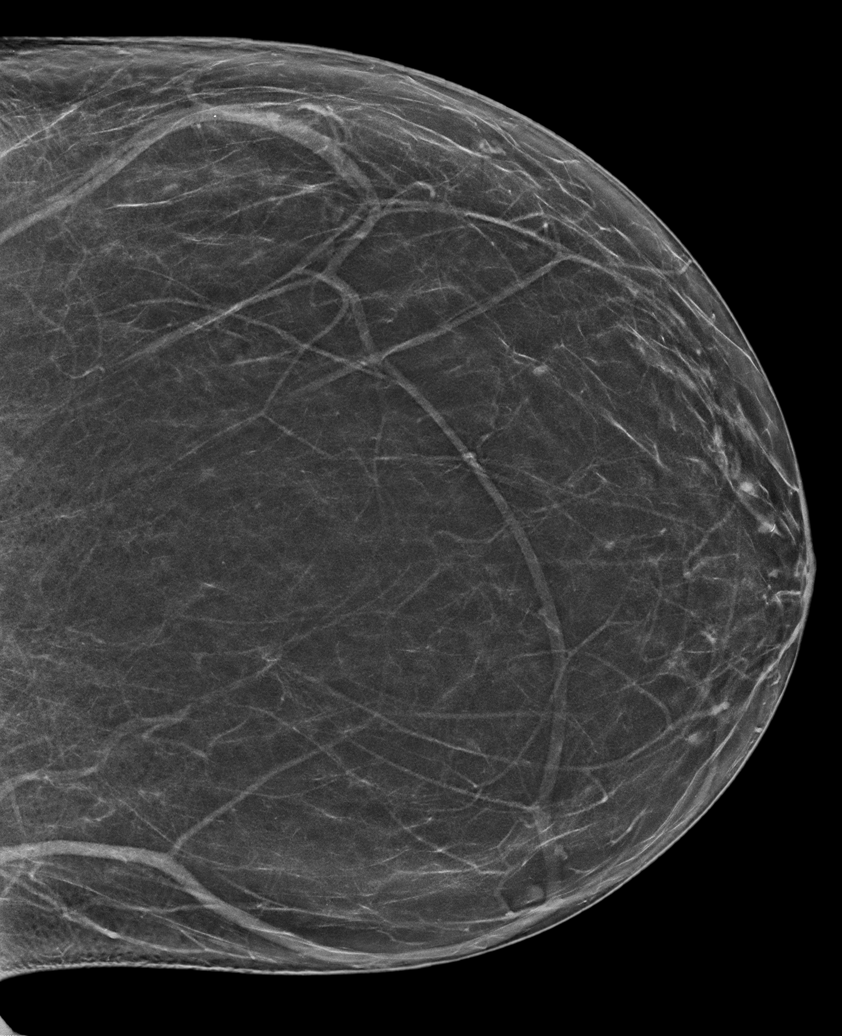

[6 of 36 positions shown; findings below may reference images not displayed]

ACR Breast Density Category b: There are scattered areas of
fibroglandular density.
FINDINGS: There are no findings suspicious for malignancy. Images were
processed with CAD.
IMPRESSION: No mammographic evidence of malignancy. A result letter of this
screening mammogram will be mailed directly to the patient.

RECOMMENDATION:
Screening mammogram in one year. (Code:[id number])

BI-RADS CATEGORY  1: Negative.

## 2022-05-26 ENCOUNTER — Other Ambulatory Visit: Payer: Self-pay | Admitting: Family Medicine

## 2022-05-26 DIAGNOSIS — R600 Localized edema: Secondary | ICD-10-CM

## 2022-05-27 NOTE — Telephone Encounter (Signed)
Requested medication (s) are due for refill today - yes  Requested medication (s) are on the active medication list -yes  Future visit scheduled -no  Last refill: 06/08/21 #90 1RF  Notes to clinic: fails lab protocol- over 1 year 2021  Requested Prescriptions  Pending Prescriptions Disp Refills   furosemide (LASIX) 20 MG tablet [Pharmacy Med Name: FUROSEMIDE 20 MG TABLET] 90 tablet 1    Sig: TAKE ONE TABLET BY MOUTH DAILY FOR SWELLING**MAY INCREASE TO 2 TABLETS IF NEEDED FOR MAX OF 7 DAYS PER FLARE     Cardiovascular:  Diuretics - Loop Failed - 05/26/2022  5:49 PM      Failed - K in normal range and within 180 days    Potassium  Date Value Ref Range Status  10/28/2019 4.4 3.5 - 5.3 mmol/L Final         Failed - Ca in normal range and within 180 days    Calcium  Date Value Ref Range Status  10/28/2019 9.5 8.6 - 10.2 mg/dL Final         Failed - Na in normal range and within 180 days    Sodium  Date Value Ref Range Status  10/28/2019 137 135 - 146 mmol/L Final         Failed - Cr in normal range and within 180 days    Creat  Date Value Ref Range Status  10/28/2019 0.95 0.50 - 1.10 mg/dL Final         Failed - Cl in normal range and within 180 days    Chloride  Date Value Ref Range Status  10/28/2019 99 98 - 110 mmol/L Final         Failed - Mg Level in normal range and within 180 days    No results found for: "MG"       Failed - Last BP in normal range    BP Readings from Last 1 Encounters:  02/03/22 (!) 156/90         Passed - Valid encounter within last 6 months    Recent Outpatient Visits           3 months ago Recurrent UTI   [facility] [name], DO   10 months ago Recurrent UTI   [facility] [name], DO   11 months ago Acute cystitis with hematuria   [facility] [name], DO   1 year ago Acute cystitis with hematuria   [place], DO   1 year ago Chronic pain of both knees   [facility] [name], DO                 Requested Prescriptions  Pending Prescriptions Disp Refills   furosemide (LASIX) 20 MG tablet [Pharmacy Med Name: FUROSEMIDE 20 MG TABLET] 90 tablet 1    Sig: TAKE ONE TABLET BY MOUTH DAILY FOR SWELLING**MAY INCREASE TO 2 TABLETS IF NEEDED FOR MAX OF 7 DAYS PER FLARE     Cardiovascular:  Diuretics - Loop Failed - 05/26/2022  5:49 PM      Failed - K in normal range and within 180 days    Potassium  Date Value Ref Range Status  10/28/2019 4.4 3.5 - 5.3 mmol/L Final         Failed - Ca in normal range and within 180 days    Calcium  Date Value Ref  Range Status  10/28/2019 9.5 8.6 - 10.2 mg/dL Final         Failed - Na in normal range and within 180 days    Sodium  Date Value Ref Range Status  10/28/2019 137 135 - 146 mmol/L Final         Failed - Cr in normal range and within 180 days    Creat  Date Value Ref Range Status  10/28/2019 0.95 0.50 - 1.10 mg/dL Final         Failed - Cl in normal range and within 180 days    Chloride  Date Value Ref Range Status  10/28/2019 99 98 - 110 mmol/L Final         Failed - Mg Level in normal range and within 180 days    No results found for: "MG"       Failed - Last BP in normal range    BP Readings from Last 1 Encounters:  02/03/22 (!) 156/90         Passed - Valid encounter within last 6 months    Recent Outpatient Visits           3 months ago Recurrent UTI   [place], DO   10 months ago Recurrent UTI   [place], DO   11 months ago Acute cystitis with hematuria   [place], DO   1 year ago Acute cystitis with hematuria   [place], DO   1 year ago Chronic pain of both knees   [place], Devonne Doughty, [place]

## 2022-06-01 ENCOUNTER — Other Ambulatory Visit: Payer: Self-pay | Admitting: Family Medicine

## 2022-06-01 DIAGNOSIS — R600 Localized edema: Secondary | ICD-10-CM

## 2022-06-01 NOTE — Telephone Encounter (Signed)
Requested medication (s) are due for refill today: yes  Requested medication (s) are on the active medication list: yes    Last refill: 06/08/21  #90  1 refill  Future visit scheduled no  Notes to clinic:Failed due to labs, please review. Thank you.  Requested Prescriptions  Pending Prescriptions Disp Refills   furosemide (LASIX) 20 MG tablet [Pharmacy Med Name: FUROSEMIDE 20 MG TABLET] 90 tablet 1    Sig: TAKE ONE TABLET BY MOUTH DAILY FOR SWELLING**MAY INCREASE TO 2 TABLETS IF NEEDED FOR MAX OF 7 DAYS PER FLARE     Cardiovascular:  Diuretics - Loop Failed - 06/01/2022  2:59 PM      Failed - K in normal range and within 180 days    Potassium  Date Value Ref Range Status  10/28/2019 4.4 3.5 - 5.3 mmol/L Final         Failed - Ca in normal range and within 180 days    Calcium  Date Value Ref Range Status  10/28/2019 9.5 8.6 - 10.2 mg/dL Final         Failed - Na in normal range and within 180 days    Sodium  Date Value Ref Range Status  10/28/2019 137 135 - 146 mmol/L Final         Failed - Cr in normal range and within 180 days    Creat  Date Value Ref Range Status  10/28/2019 0.95 0.50 - 1.10 mg/dL Final         Failed - Cl in normal range and within 180 days    Chloride  Date Value Ref Range Status  10/28/2019 99 98 - 110 mmol/L Final         Failed - Mg Level in normal range and within 180 days    No results found for: "MG"       Failed - Last BP in normal range    BP Readings from Last 1 Encounters:  02/03/22 (!) 156/90         Passed - Valid encounter within last 6 months    Recent Outpatient Visits           3 months ago Recurrent UTI   [place], DO   10 months ago Recurrent UTI   [place], DO   11 months ago Acute cystitis with hematuria   [place], DO   1 year ago Acute cystitis with hematuria   [place], DO   1 year ago Chronic pain of both knees   [place], Devonne Doughty, [place]

## 2022-07-28 ENCOUNTER — Other Ambulatory Visit: Payer: Self-pay | Admitting: Family Medicine

## 2022-07-28 DIAGNOSIS — K219 Gastro-esophageal reflux disease without esophagitis: Secondary | ICD-10-CM

## 2022-07-30 NOTE — Telephone Encounter (Signed)
Requested Prescriptions  Pending Prescriptions Disp Refills   omeprazole (PRILOSEC) 20 MG capsule [Pharmacy Med Name: OMEPRAZOLE DR 20 MG CAPSULE] 30 capsule 0    Sig: Take 1 capsule (20 mg total) by mouth daily as needed. OFFICE VISIT NEEDED FOR ADDITIONAL REFILLS     Gastroenterology: Proton Pump Inhibitors Passed - 07/28/2022  7:52 PM      Passed - Valid encounter within last 12 months    Recent Outpatient Visits           5 months ago Recurrent UTI   [place], DO   1 year ago Recurrent UTI   [place], DO   1 year ago Acute cystitis with hematuria   [place], DO   1 year ago Acute cystitis with hematuria   [place], DO   2 years ago Chronic pain of both knees   [place], Devonne Doughty, [place]

## 2023-05-22 NOTE — Progress Notes (Signed)
Sleep Medicine   Office Visit  Patient Name: Maria Jacobson DOB: 1975/11/20 MRN [id number]    Chief Complaint: Sleep consult  Brief History:  Maria Jacobson presents for an initial consult for sleep evaluation and to establish care. Patient has a longstanding history of excessive daytime sleepiness and inability to fall asleep and stay asleep. Sleep quality is poor. This is noted most nights. The patient's bed partner reports snoring in the past, and gasping at night. The patient relates the following symptoms: headaches, trouble concentrating, brain fogginess, fatigue are also present. The patient goes to sleep at 1000 pm and wakes up at 0600 and will wake up several times in between with trouble returning to sleep. Patient also reports taking naps everyday.  Sleep quality is worse when outside home environment. Patient has noted some movement of her legs at night that would disrupt her sleep.  The patient  relates sleep talking as unusual behavior during the night.  The patient relates ADD, PTSD, depression, anxiety, bipolar as a history of psychiatric problems. The Epworth Sleepiness Score is 15 out of 24 .  The patient relates  Cardiovascular risk factors include: none.    ROS  General: (-) fever, (-) chills, (-) night sweat Nose and Sinuses: (-) nasal stuffiness or itchiness, (-) postnasal drip, (-) nosebleeds, (-) sinus trouble. Mouth and Throat: (-) sore throat, (-) hoarseness. Neck: (-) swollen glands, (-) enlarged thyroid, (-) neck pain. Respiratory: - cough, - shortness of breath, - wheezing. Neurologic: + numbness, + tingling. Psychiatric: - anxiety, - depression Sleep behavior: -sleep paralysis -hypnogogic hallucinations -dream enactment      -vivid dreams -cataplexy -night terrors -sleep walking   Current Medication: Outpatient Encounter Medications as of 05/23/2023  Medication Sig Note   tiZANidine (ZANAFLEX) 2 MG tablet TAKE ONE TABLET BY MOUTH EVERY 8 HOURS AS NEEDED FOR MUSCLE  PAIN    acetaminophen (TYLENOL) 500 MG tablet Take 1,000 mg by mouth every 4 (four) hours as needed for headache.    albuterol (VENTOLIN HFA) 108 (90 Base) MCG/ACT inhaler INHALE TWO PUFFS BY MOUTH EVERY 4 HOURS AS NEEDED FOR WHEEZING OR FOR SHORTNESS OF BREATH OR FOR COUGH    ALPRAZolam (XANAX) 1 MG tablet Take 1 mg by mouth at bedtime as needed for anxiety.    amphetamine-dextroamphetamine (ADDERALL) 20 MG tablet Take by mouth.    amphetamine-dextroamphetamine (ADDERALL) 30 MG tablet Take 30 mg by mouth daily. 12/04/2019: QAM   ARIPiprazole (ABILIFY) 15 MG tablet Take 15 mg by mouth daily.    clonazePAM (KLONOPIN) 0.5 MG tablet Take 0.5 mg by mouth 5 (five) times daily.    fexofenadine (ALLEGRA) 180 MG tablet Take 180 mg by mouth daily as needed for allergies or rhinitis.    furosemide (LASIX) 20 MG tablet TAKE 1 TABLET BY MOUTH EVERY DAY FOR SWELLING, MAY INCREASE TO 2 TABLETS IF NEEDED FOR MAX 7 DAYS PER FLARE    gabapentin (NEURONTIN) 300 MG capsule Take by mouth.    lamoTRIgine (LAMICTAL) 100 MG tablet Take 200 mg by mouth 2 (two) times daily. Taking 200 mg (2 tablets) each morning and 150 mg (1.5 tablets) each evening    meloxicam (MOBIC) 15 MG tablet Take 1 tablet (15 mg total) by mouth daily.    omeprazole (PRILOSEC) 20 MG capsule Take 1 capsule (20 mg total) by mouth daily as needed. OFFICE VISIT NEEDED FOR ADDITIONAL REFILLS    [DISCONTINUED] celecoxib (CELEBREX) 100 MG capsule Take 100 mg by mouth 2 (two) times daily.    [  DISCONTINUED] cephALEXin (KEFLEX) 500 MG capsule Take 1 capsule (500 mg total) by mouth 3 (three) times daily. For 7 days    [DISCONTINUED] cyclobenzaprine (FLEXERIL) 10 MG tablet Take 1 tablet (10 mg total) by mouth 3 (three) times daily as needed for muscle spasms.    [DISCONTINUED] fluconazole (DIFLUCAN) 150 MG tablet Take one tablet by mouth on Day 1. Repeat dose 2nd tablet on Day 3.    [DISCONTINUED] melatonin 3 MG TABS tablet Take 3 mg by mouth at bedtime.     [DISCONTINUED] metFORMIN (GLUCOPHAGE) 500 MG tablet Take 500 mg by mouth 2 (two) times daily with a meal.    [DISCONTINUED] Triamcinolone Acetonide (NASACORT ALLERGY 24HR NA) Place into the nose daily as needed.    No facility-administered encounter medications on file as of 05/23/2023.    Surgical History: Past Surgical History:  Procedure Laterality Date   BREAST BIOPSY Right    DILATION AND CURETTAGE OF UTERUS      Medical History: Past Medical History:  Diagnosis Date   Anxiety    pt is seen by psych   Bipolar 1 disorder (HCC)    Bipolar affective disorder, depressed, severe (HCC)    Depression     Family History: Non contributory to the present illness  Social History: Social History   Socioeconomic History   Marital status: Significant Other    Spouse name: Not on file   Number of children: Not on file   Years of education: Not on file   Highest education level: Not on file  Occupational History   Occupation: [employer]    Comment: Travels to [employer] often  Tobacco Use   Smoking status: Never   Smokeless tobacco: Never  Vaping Use   Vaping status: Never Used  Substance and Sexual Activity   Alcohol use: Not Currently    Comment: rarely   Drug use: No   Sexual activity: Yes    Birth control/protection: I.U.D.  Other Topics Concern   Not on file  Social History Narrative   Not on file   Social Determinants of Health   Financial Resource Strain: Low Risk  (03/13/2023)   Received from [facility]   Overall Financial Resource Strain (CARDIA)    Difficulty of Paying Living Expenses: Not hard at all  Food Insecurity: No Food Insecurity (03/13/2023)   Received from [facility]   Hunger Vital Sign    Worried About Running Out of Food in the Last Year: Never true    Ran Out of Food in the Last Year: Never true  Transportation Needs: No Transportation Needs (10/19/2022)   Received from [facility] - Transportation     Lack of Transportation (Medical): No    Lack of Transportation (Non-Medical): No  Physical Activity: Inactive (03/18/2021)   Exercise Vital Sign    Days of Exercise per Week: 0 days    Minutes of Exercise per Session: 0 min  Stress: Stress Concern Present (03/18/2021)   [employer] of Occupational Health - Occupational Stress Questionnaire    Feeling of Stress : Rather much  Social Connections: Moderately Isolated (03/18/2021)   Social Connection and Isolation Panel [NHANES]    Frequency of Communication with Friends and Family: More than three times a week    Frequency of Social Gatherings with Friends and Family: More than three times a week    Attends Religious Services: Never    Database administrator or Organizations: No  Attends Banker Meetings: Never    Marital Status: Living with partner  Intimate Partner Violence: Not At Risk (03/18/2021)   Humiliation, Afraid, Rape, and Kick questionnaire    Fear of Current or Ex-Partner: No    Emotionally Abused: No    Physically Abused: No    Sexually Abused: No    Vital Signs: Blood pressure 122/89, pulse 68, resp. rate 16, height 5\' 9"  (1.753 m), weight 293 lb (132.9 kg), SpO2 97%. Body mass index is 43.27 kg/m.   Examination: General Appearance: The patient is well-developed, well-nourished, and in no distress. Neck Circumference: 42 cm Skin: Gross inspection of skin unremarkable. Head: normocephalic, no gross deformities. Eyes: no gross deformities noted. ENT: ears appear grossly normal Neurologic: Alert and oriented. No involuntary movements.    STOP BANG RISK ASSESSMENT S (snore) Have you been told that you snore?     YES   T (tired) Are you often tired, fatigued, or sleepy during the day?   YES  O (obstruction) Do you stop breathing, choke, or gasp during sleep? YES   P (pressure) Do you have or are you being treated for high blood pressure? NO   B (BMI) Is your body index greater than 35 kg/m?  YES   A (age) Are you 60 years old or older? NO   N (neck) Do you have a neck circumference greater than 16 inches?   YES   G (gender) Are you a female? NO   TOTAL STOP/BANG "YES" ANSWERS 5                                                               A STOP-Bang score of 2 or less is considered low risk, and a score of 5 or more is high risk for having either moderate or severe OSA. For people who score 3 or 4, doctors may need to perform further assessment to determine how likely they are to have OSA.         EPWORTH SLEEPINESS SCALE:  Scale:  (0)= no chance of dozing; (1)= slight chance of dozing; (2)= moderate chance of dozing; (3)= high chance of dozing  Chance  Situtation    Sitting and reading: 2    Watching TV: 3    Sitting Inactive in public: 1    As a passenger in car: 2      Lying down to rest: 3    Sitting and talking: 2    Sitting quielty after lunch: 2    In a car, stopped in traffic: 0   TOTAL SCORE:   15 out of 24    SLEEP STUDIES:  None   LABS: No results found for this or any previous visit (from the past 2160 hour(s)).  Radiology: MS DIGITAL SCREENING TOMO BILATERAL  Result Date: 12/31/2019 CLINICAL DATA:  Screening. EXAM: DIGITAL SCREENING BILATERAL MAMMOGRAM WITH TOMO AND CAD COMPARISON:  Previous exam(s). ACR Breast Density Category b: There are scattered areas of fibroglandular density. FINDINGS: There are no findings suspicious for malignancy. Images were processed with CAD. IMPRESSION: No mammographic evidence of malignancy. A result letter of this screening mammogram will be mailed directly to the patient. RECOMMENDATION: Screening mammogram in one year. (Code:SM-B-01Y) BI-RADS CATEGORY  1: Negative. Electronically Signed  By: Gerome Sam III M.D   On: 12/31/2019 16:50    No results found.  No results found.    Assessment and Plan: Patient Active Problem List   Diagnosis Date Noted   Abnormal urine 05/01/2020   Abnormal  urine odor 05/01/2020   Fatigue 05/01/2020   GERD (gastroesophageal reflux disease) 09/25/2019   Epidermal cyst of neck 06/13/2017   Atypical mole 10/05/2016   Bilateral lower extremity edema 06/15/2016   Pain and swelling of left lower leg 06/15/2016   Pain in joint, multiple sites 06/15/2016   Family history of rheumatoid arthritis 06/15/2016   Chronic bilateral low back pain with right-sided sciatica 05/24/2016   Left medial knee pain 05/23/2016   Anxiety 05/23/2016   Bipolar affective disorder, depressed, severe (HCC) 05/23/2016   Morbid obesity with BMI of 50.0-59.9, adult (HCC) 05/23/2016   Hyperlipidemia 05/23/2016     PLAN OSA:   Patient evaluation suggests high risk of sleep disordered breathing due to snoring, gasping, headaches, trouble concentrating, brain fogginess, fatigue, and elevated BMI.   Suggest: PSG to assess/treat the patient's sleep disordered breathing. The patient was also counselled on wt loss to optimize sleep health.  1. Hypersomnia Will order PSG  2. RLS (restless legs syndrome) Will order PSG to evaluate further  3. Bipolar affective disorder, depressed, severe (HCC) Followed by psych  4. PTSD (post-traumatic stress disorder) Followed by psych  5. Attention deficit hyperactivity disorder (ADHD), unspecified ADHD type Followed by psych  6. Morbid obesity with BMI of 40.0-44.9, adult (HCC) Obesity Counseling: Had a lengthy discussion regarding patients BMI and weight issues. Patient was instructed on portion control as well as increased activity. Also discussed caloric restrictions with trying to maintain intake less than 2000 Kcal. Discussions were made in accordance with the 5As of weight management. Simple actions such as not eating late and if able to, taking a walk is suggested.    General Counseling: I have discussed the findings of the evaluation and examination with Maria Jacobson.  I have also discussed any further diagnostic evaluation thatmay  be needed or ordered today. Maria Jacobson verbalizes understanding of the findings of todays visit. We also reviewed her medications today and discussed drug interactions and side effects including but not limited excessive drowsiness and altered mental states. We also discussed that there is always a risk not just to her but also people around her. she has been encouraged to call the office with any questions or concerns that should arise related to todays visit.  No orders of the defined types were placed in this encounter.       I have personally obtained a history, evaluated the patient, evaluated pertinent data, formulated the assessment and plan and placed orders.  This patient was seen by Lynn Ito, PA-C in collaboration with Dr. Freda Munro as a part of collaborative care agreement.    Yevonne Pax, MD [facility] Diplomate ABMS Pulmonary and Critical Care Medicine Sleep medicine

## 2023-05-23 ENCOUNTER — Ambulatory Visit (INDEPENDENT_AMBULATORY_CARE_PROVIDER_SITE_OTHER): Payer: Medicaid Other | Admitting: Internal Medicine

## 2023-05-23 VITALS — BP 122/89 | HR 68 | Resp 16 | Ht 69.0 in | Wt 293.0 lb

## 2023-05-23 DIAGNOSIS — G2581 Restless legs syndrome: Secondary | ICD-10-CM | POA: Diagnosis not present

## 2023-05-23 DIAGNOSIS — F314 Bipolar disorder, current episode depressed, severe, without psychotic features: Secondary | ICD-10-CM | POA: Diagnosis not present

## 2023-05-23 DIAGNOSIS — F909 Attention-deficit hyperactivity disorder, unspecified type: Secondary | ICD-10-CM

## 2023-05-23 DIAGNOSIS — F4310 Post-traumatic stress disorder, unspecified: Secondary | ICD-10-CM | POA: Diagnosis not present

## 2023-05-23 DIAGNOSIS — G4710 Hypersomnia, unspecified: Secondary | ICD-10-CM | POA: Diagnosis not present

## 2023-05-23 DIAGNOSIS — Z6841 Body Mass Index (BMI) 40.0 and over, adult: Secondary | ICD-10-CM

## 2024-02-19 ENCOUNTER — Ambulatory Visit: Admitting: Internal Medicine

## 2024-02-19 NOTE — Progress Notes (Deleted)
 [facility] [address] [place], [place] [address]  Pulmonary Sleep Medicine   Office Visit Note  Patient Name: Tamora Huneke DOB: 11-01-1975 MRN [id number]    Chief Complaint: Obstructive Sleep Apnea visit  Brief History:  Hibo is seen today for a follow up visit for APAP@ 4-20 cmH2O. The patient has a *** history of sleep apnea. Patient is not using PAP nightly.  The patient feels *** after sleeping with PAP.  The patient reports *** from PAP use. Reported sleepiness is  *** and the Epworth Sleepiness Score is *** out of 24. The patient *** take naps. The patient complains of the following: ***  The compliance download shows no data usage in order to show compliance. The patient *** of limb movements disrupting sleep. The patient continues to require PAP therapy in order to eliminate sleep apnea.   ROS  General: (-) fever, (-) chills, (-) night sweat Nose and Sinuses: (-) nasal stuffiness or itchiness, (-) postnasal drip, (-) nosebleeds, (-) sinus trouble. Mouth and Throat: (-) sore throat, (-) hoarseness. Neck: (-) swollen glands, (-) enlarged thyroid, (-) neck pain. Respiratory: *** cough, *** shortness of breath, *** wheezing. Neurologic: *** numbness, *** tingling. Psychiatric: *** anxiety, *** depression   Current Medication: Outpatient Encounter Medications as of 02/19/2024  Medication Sig Note   acetaminophen (TYLENOL) 500 MG tablet Take 1,000 mg by mouth every 4 (four) hours as needed for headache.    albuterol  (VENTOLIN  HFA) 108 (90 Base) MCG/ACT inhaler INHALE TWO PUFFS BY MOUTH EVERY 4 HOURS AS NEEDED FOR WHEEZING OR FOR SHORTNESS OF BREATH OR FOR COUGH    ALPRAZolam (XANAX) 1 MG tablet Take 1 mg by mouth at bedtime as needed for anxiety.    amphetamine-dextroamphetamine (ADDERALL) 20 MG tablet Take by mouth.    amphetamine-dextroamphetamine (ADDERALL) 30 MG tablet Take 30 mg by mouth daily. 12/04/2019: QAM   ARIPiprazole (ABILIFY) 15 MG tablet Take 15  mg by mouth daily.    clonazePAM (KLONOPIN) 0.5 MG tablet Take 0.5 mg by mouth 5 (five) times daily.    fexofenadine (ALLEGRA) 180 MG tablet Take 180 mg by mouth daily as needed for allergies or rhinitis.    furosemide  (LASIX ) 20 MG tablet TAKE 1 TABLET BY MOUTH EVERY DAY FOR SWELLING, MAY INCREASE TO 2 TABLETS IF NEEDED FOR MAX 7 DAYS PER FLARE    gabapentin  (NEURONTIN ) 300 MG capsule Take by mouth.    lamoTRIgine (LAMICTAL) 100 MG tablet Take 200 mg by mouth 2 (two) times daily. Taking 200 mg (2 tablets) each morning and 150 mg (1.5 tablets) each evening    meloxicam  (MOBIC ) 15 MG tablet Take 1 tablet (15 mg total) by mouth daily.    omeprazole  (PRILOSEC) 20 MG capsule Take 1 capsule (20 mg total) by mouth daily as needed. OFFICE VISIT NEEDED FOR ADDITIONAL REFILLS    tiZANidine (ZANAFLEX) 2 MG tablet TAKE ONE TABLET BY MOUTH EVERY 8 HOURS AS NEEDED FOR MUSCLE PAIN    No facility-administered encounter medications on file as of 02/19/2024.    Surgical History: Past Surgical History:  Procedure Laterality Date   BREAST BIOPSY Right    DILATION AND CURETTAGE OF UTERUS      Medical History: Past Medical History:  Diagnosis Date   Anxiety    pt is seen by psych   Bipolar 1 disorder (HCC)    Bipolar affective disorder, depressed, severe (HCC)    Depression     Family History: Non contributory to the present  illness  Social History: Social History   Socioeconomic History   Marital status: Significant Other    Spouse name: Not on file   Number of children: Not on file   Years of education: Not on file   Highest education level: Not on file  Occupational History   Occupation: [employer]    Comment: Travels to [employer] often  Tobacco Use   Smoking status: Never   Smokeless tobacco: Never  Vaping Use   Vaping status: Never Used  Substance and Sexual Activity   Alcohol use: Not Currently    Comment: rarely   Drug use: No   Sexual activity: Yes    Birth  control/protection: I.U.D.  Other Topics Concern   Not on file  Social History Narrative   Not on file   Social Drivers of Health   Financial Resource Strain: Low Risk  (03/13/2023)   Received from [facility]   Overall Financial Resource Strain (CARDIA)    Difficulty of Paying Living Expenses: Not hard at all  Food Insecurity: No Food Insecurity (03/13/2023)   Received from [facility]   Hunger Vital Sign    Within the past 12 months, you worried that your food would run out before you got the money to buy more.: Never true    Within the past 12 months, the food you bought just didn't last and you didn't have money to get more.: Never true  Transportation Needs: No Transportation Needs (10/19/2022)   Received from [facility] - Transportation    Lack of Transportation (Medical): No    Lack of Transportation (Non-Medical): No  Physical Activity: Inactive (03/18/2021)   Exercise Vital Sign    Days of Exercise per Week: 0 days    Minutes of Exercise per Session: 0 min  Stress: Stress Concern Present (03/18/2021)   [employer] of Occupational Health - Occupational Stress Questionnaire    Feeling of Stress : Rather much  Social Connections: Moderately Isolated (03/18/2021)   Social Connection and Isolation Panel    Frequency of Communication with Friends and Family: More than three times a week    Frequency of Social Gatherings with Friends and Family: More than three times a week    Attends Religious Services: Never    Database administrator or Organizations: No    Attends Banker Meetings: Never    Marital Status: Living with partner  Intimate Partner Violence: Not At Risk (03/18/2021)   Humiliation, Afraid, Rape, and Kick questionnaire    Fear of Current or Ex-Partner: No    Emotionally Abused: No    Physically Abused: No    Sexually Abused: No    Vital Signs: There were no vitals taken for this visit. There is no height or weight on  file to calculate BMI.    Examination: General Appearance: The patient is well-developed, well-nourished, and in no distress. Neck Circumference: 42 cm Skin: Gross inspection of skin unremarkable. Head: normocephalic, no gross deformities. Eyes: no gross deformities noted. ENT: ears appear grossly normal Neurologic: Alert and oriented. No involuntary movements.  STOP BANG RISK ASSESSMENT S (snore) Have you been told that you snore?     YES   T (tired) Are you often tired, fatigued, or sleepy during the day?   YES  O (obstruction) Do you stop breathing, choke, or gasp during sleep? YES   P (pressure) Do you have or are you being treated for high blood pressure?  NO   B (BMI) Is your body index greater than 35 kg/m? YES   A (age) Are you 18 years old or older? NO   N (neck) Do you have a neck circumference greater than 16 inches?   YES   G (gender) Are you a female? NO   TOTAL STOP/BANG "YES" ANSWERS 5       A STOP-Bang score of 2 or less is considered low risk, and a score of 5 or more is high risk for having either moderate or severe OSA. For people who score 3 or 4, doctors may need to perform further assessment to determine how likely they are to have OSA.         EPWORTH SLEEPINESS SCALE:  Scale:  (0)= no chance of dozing; (1)= slight chance of dozing; (2)= moderate chance of dozing; (3)= high chance of dozing  Chance  Situtation    Sitting and reading: ***    Watching TV: ***    Sitting Inactive in public: ***    As a passenger in car: ***      Lying down to rest: ***    Sitting and talking: ***    Sitting quielty after lunch: ***    In a car, stopped in traffic: ***   TOTAL SCORE:   *** out of 24    SLEEP STUDIES:  ***   CPAP COMPLIANCE DATA:  Date Range: No data usage available to show compliance  Average Daily Use: *** hours  Median Use: ***  Compliance for > 4 Hours: *** days  AHI: *** respiratory events per hour  Days Used:  ***  Mask Leak: ***  95th Percentile Pressure: ***         LABS: No results found for this or any previous visit (from the past 2160 hours).  Radiology: MS DIGITAL SCREENING TOMO BILATERAL Result Date: 12/31/2019 CLINICAL DATA:  Screening. EXAM: DIGITAL SCREENING BILATERAL MAMMOGRAM WITH TOMO AND CAD COMPARISON:  Previous exam(s). ACR Breast Density Category b: There are scattered areas of fibroglandular density. FINDINGS: There are no findings suspicious for malignancy. Images were processed with CAD. IMPRESSION: No mammographic evidence of malignancy. A result letter of this screening mammogram will be mailed directly to the patient. RECOMMENDATION: Screening mammogram in one year. (Code:SM-B-01Y) BI-RADS CATEGORY  1: Negative. Electronically Signed   By: Alm Pouch III M.D   On: 12/31/2019 16:50    No results found.  No results found.    Assessment and Plan: Patient Active Problem List   Diagnosis Date Noted   Abnormal urine 05/01/2020   Abnormal urine odor 05/01/2020   Fatigue 05/01/2020   GERD (gastroesophageal reflux disease) 09/25/2019   Epidermal cyst of neck 06/13/2017   Atypical mole 10/05/2016   Bilateral lower extremity edema 06/15/2016   Pain and swelling of left lower leg 06/15/2016   Pain in joint, multiple sites 06/15/2016   Family history of rheumatoid arthritis 06/15/2016   Chronic bilateral low back pain with right-sided sciatica 05/24/2016   Left medial knee pain 05/23/2016   Anxiety 05/23/2016   Bipolar affective disorder, depressed, severe (HCC) 05/23/2016   Morbid obesity with BMI of 50.0-59.9, adult (HCC) 05/23/2016   Hyperlipidemia 05/23/2016      The patient *** tolerate PAP and reports *** benefit from PAP use. The patient was reminded how to *** and advised to ***. The patient was also counselled on ***. The compliance is ***. The AHI is ***.   ***  General Counseling: I  have discussed the findings of the evaluation and  examination with Rikita.  I have also discussed any further diagnostic evaluation thatmay be needed or ordered today. Jalyssa verbalizes understanding of the findings of todays visit. We also reviewed her medications today and discussed drug interactions and side effects including but not limited excessive drowsiness and altered mental states. We also discussed that there is always a risk not just to her but also people around her. she has been encouraged to call the office with any questions or concerns that should arise related to todays visit.  No orders of the defined types were placed in this encounter.       I have personally obtained a history, examined the patient, evaluated laboratory and imaging results, formulated the assessment and plan and placed orders.  Elfreda DELENA Bathe, MD [facility] Diplomate ABMS Pulmonary Critical Care Medicine and Sleep Medicine

## 2024-03-22 NOTE — Progress Notes (Signed)
 No show

## 2024-05-03 NOTE — Progress Notes (Deleted)
 [facility] [address] [place], [place] [address]  Pulmonary Sleep Medicine   Office Visit Note  Patient Name: Maria Jacobson DOB: [birth year] MRN [id number]    Chief Complaint: Obstructive Sleep Apnea visit  Brief History:  Maria Jacobson is seen today for a follow up visit for APAP@ 4-20 cmH2O. The patient has a 10 month history of sleep apnea. Patient is not using PAP nightly.  The patient feels *** after sleeping with PAP.  The patient reports *** from PAP use. Reported sleepiness is  *** and the Epworth Sleepiness Score is *** out of 24. The patient *** take naps. The patient complains of the following: ***  The compliance download shows  compliance with an average use time of *** hours. The AHI is ***  The patient *** of limb movements disrupting sleep. The patient continues to require PAP therapy in order to eliminate sleep apnea.   ROS  General: (-) fever, (-) chills, (-) night sweat Nose and Sinuses: (-) nasal stuffiness or itchiness, (-) postnasal drip, (-) nosebleeds, (-) sinus trouble. Mouth and Throat: (-) sore throat, (-) hoarseness. Neck: (-) swollen glands, (-) enlarged thyroid, (-) neck pain. Respiratory: *** cough, *** shortness of breath, *** wheezing. Neurologic: *** numbness, *** tingling. Psychiatric: *** anxiety, *** depression   Current Medication: Outpatient Encounter Medications as of 05/06/2024  Medication Sig Note   acetaminophen (TYLENOL) 500 MG tablet Take 1,000 mg by mouth every 4 (four) hours as needed for headache.    albuterol  (VENTOLIN  HFA) 108 (90 Base) MCG/ACT inhaler INHALE TWO PUFFS BY MOUTH EVERY 4 HOURS AS NEEDED FOR WHEEZING OR FOR SHORTNESS OF BREATH OR FOR COUGH    ALPRAZolam (XANAX) 1 MG tablet Take 1 mg by mouth at bedtime as needed for anxiety.    amphetamine-dextroamphetamine (ADDERALL) 20 MG tablet Take by mouth.    amphetamine-dextroamphetamine (ADDERALL) 30 MG tablet Take 30 mg by mouth daily. 12/04/2019: QAM   ARIPiprazole  (ABILIFY) 15 MG tablet Take 15 mg by mouth daily.    clonazePAM (KLONOPIN) 0.5 MG tablet Take 0.5 mg by mouth 5 (five) times daily.    fexofenadine (ALLEGRA) 180 MG tablet Take 180 mg by mouth daily as needed for allergies or rhinitis.    furosemide  (LASIX ) 20 MG tablet TAKE 1 TABLET BY MOUTH EVERY DAY FOR SWELLING, MAY INCREASE TO 2 TABLETS IF NEEDED FOR MAX 7 DAYS PER FLARE    gabapentin  (NEURONTIN ) 300 MG capsule Take by mouth.    lamoTRIgine (LAMICTAL) 100 MG tablet Take 200 mg by mouth 2 (two) times daily. Taking 200 mg (2 tablets) each morning and 150 mg (1.5 tablets) each evening    meloxicam  (MOBIC ) 15 MG tablet Take 1 tablet (15 mg total) by mouth daily.    omeprazole  (PRILOSEC) 20 MG capsule Take 1 capsule (20 mg total) by mouth daily as needed. OFFICE VISIT NEEDED FOR ADDITIONAL REFILLS    tiZANidine (ZANAFLEX) 2 MG tablet TAKE ONE TABLET BY MOUTH EVERY 8 HOURS AS NEEDED FOR MUSCLE PAIN    No facility-administered encounter medications on file as of 05/06/2024.    Surgical History: Past Surgical History:  Procedure Laterality Date   BREAST BIOPSY Right    DILATION AND CURETTAGE OF UTERUS      Medical History: Past Medical History:  Diagnosis Date   Anxiety    pt is seen by psych   Bipolar 1 disorder (HCC)    Bipolar affective disorder, depressed, severe (HCC)    Depression  Family History: Non contributory to the present illness  Social History: Social History   Socioeconomic History   Marital status: Significant Other    Spouse name: Not on file   Number of children: Not on file   Years of education: Not on file   Highest education level: Not on file  Occupational History   Occupation: [employer]    Comment: Travels to [employer] often  Tobacco Use   Smoking status: Never   Smokeless tobacco: Never  Vaping Use   Vaping status: Never Used  Substance and Sexual Activity   Alcohol use: Not Currently    Comment: rarely   Drug use: No    Sexual activity: Yes    Birth control/protection: I.U.D.  Other Topics Concern   Not on file  Social History Narrative   Not on file   Social Drivers of Health   Financial Resource Strain: Medium Risk (03/14/2024)   Received from [facility]   Overall Financial Resource Strain (CARDIA)    How hard is it for you to pay for the very basics like food, housing, medical care, and heating?: Somewhat hard  Food Insecurity: Food Insecurity Present (03/14/2024)   Received from [facility]   Hunger Vital Sign    Within the past 12 months, you worried that your food would run out before you got the money to buy more.: Sometimes true    Within the past 12 months, the food you bought just didn't last and you didn't have money to get more.: Patient declined  Transportation Needs: No Transportation Needs (03/14/2024)   Received from [facility]   PRAPARE - Transportation    Lack of Transportation (Medical): No    Lack of Transportation (Non-Medical): No  Physical Activity: Inactive (03/18/2021)   Exercise Vital Sign    Days of Exercise per Week: 0 days    Minutes of Exercise per Session: 0 min  Stress: Stress Concern Present (03/18/2021)   [employer] of Occupational Health - Occupational Stress Questionnaire    Feeling of Stress : Rather much  Social Connections: Moderately Isolated (03/18/2021)   Social Connection and Isolation Panel    Frequency of Communication with Friends and Family: More than three times a week    Frequency of Social Gatherings with Friends and Family: More than three times a week    Attends Religious Services: Never    Database administrator or Organizations: No    Attends Banker Meetings: Never    Marital Status: Living with partner  Intimate Partner Violence: Not At Risk (03/18/2021)   Humiliation, Afraid, Rape, and Kick questionnaire    Fear of Current or Ex-Partner: No    Emotionally Abused: No    Physically Abused: No    Sexually  Abused: No    Vital Signs: There were no vitals taken for this visit. There is no height or weight on file to calculate BMI.    Examination: General Appearance: The patient is well-developed, well-nourished, and in no distress. Neck Circumference: *** Skin: Gross inspection of skin unremarkable. Head: normocephalic, no gross deformities. Eyes: no gross deformities noted. ENT: ears appear grossly normal Neurologic: Alert and oriented. No involuntary movements.  STOP BANG RISK ASSESSMENT S (snore) Have you been told that you snore?     YES   T (tired) Are you often tired, fatigued, or sleepy during the day?   YES  O (obstruction) Do you stop breathing, choke, or gasp during sleep?  YES   P (pressure) Do you have or are you being treated for high blood pressure? YES   B (BMI) Is your body index greater than 35 kg/m? YES   A (age) Are you 59 years old or older? NO   N (neck) Do you have a neck circumference greater than 16 inches?   YES   G (gender) Are you a female? NO   TOTAL STOP/BANG "YES" ANSWERS        A STOP-Bang score of 2 or less is considered low risk, and a score of 5 or more is high risk for having either moderate or severe OSA. For people who score 3 or 4, doctors may need to perform further assessment to determine how likely they are to have OSA.         EPWORTH SLEEPINESS SCALE:  Scale:  (0)= no chance of dozing; (1)= slight chance of dozing; (2)= moderate chance of dozing; (3)= high chance of dozing  Chance  Situtation    Sitting and reading: ***    Watching TV: ***    Sitting Inactive in public: ***    As a passenger in car: ***      Lying down to rest: ***    Sitting and talking: ***    Sitting quielty after lunch: ***    In a car, stopped in traffic: ***   TOTAL SCORE:   *** out of 24    SLEEP STUDIES:  HST (08/2023) AHI 9.3/hr, min SPO2 78%- repeat recommended due to short time recorded. HST (09/2023) AHI 12/hr, min SpO2  80%   CPAP COMPLIANCE DATA:  Date Range: No usage data recorded.  Average Daily Use: *** hours  Median Use: ***  Compliance for > 4 Hours: *** days  AHI: *** respiratory events per hour  Days Used: ***  Mask Leak: ***  95th Percentile Pressure: ***         LABS: No results found for this or any previous visit (from the past 2160 hours).  Radiology: MS DIGITAL SCREENING TOMO BILATERAL Result Date: 12/31/2019 CLINICAL DATA:  Screening. EXAM: DIGITAL SCREENING BILATERAL MAMMOGRAM WITH TOMO AND CAD COMPARISON:  Previous exam(s). ACR Breast Density Category b: There are scattered areas of fibroglandular density. FINDINGS: There are no findings suspicious for malignancy. Images were processed with CAD. IMPRESSION: No mammographic evidence of malignancy. A result letter of this screening mammogram will be mailed directly to the patient. RECOMMENDATION: Screening mammogram in one year. (Code:SM-B-01Y) BI-RADS CATEGORY  1: Negative. Electronically Signed   By: Alm Pouch III M.D   On: 12/31/2019 16:50    No results found.  No results found.    Assessment and Plan: Patient Active Problem List   Diagnosis Date Noted   Abnormal urine 05/01/2020   Abnormal urine odor 05/01/2020   Fatigue 05/01/2020   GERD (gastroesophageal reflux disease) 09/25/2019   Epidermal cyst of neck 06/13/2017   Atypical mole 10/05/2016   Bilateral lower extremity edema 06/15/2016   Pain and swelling of left lower leg 06/15/2016   Pain in joint, multiple sites 06/15/2016   Family history of rheumatoid arthritis 06/15/2016   Chronic bilateral low back pain with right-sided sciatica 05/24/2016   Left medial knee pain 05/23/2016   Anxiety 05/23/2016   Bipolar affective disorder, depressed, severe (HCC) 05/23/2016   Morbid obesity with BMI of 50.0-59.9, adult (HCC) 05/23/2016   Hyperlipidemia 05/23/2016      The patient *** tolerate PAP and reports *** benefit from PAP  use. The patient was  reminded how to *** and advised to ***. The patient was also counselled on ***. The compliance is ***. The AHI is ***.   ***  General Counseling: I have discussed the findings of the evaluation and examination with Maria Jacobson.  I have also discussed any further diagnostic evaluation thatmay be needed or ordered today. Maria Jacobson verbalizes understanding of the findings of todays visit. We also reviewed her medications today and discussed drug interactions and side effects including but not limited excessive drowsiness and altered mental states. We also discussed that there is always a risk not just to her but also people around her. she has been encouraged to call the office with any questions or concerns that should arise related to todays visit.  No orders of the defined types were placed in this encounter.       I have personally obtained a history, examined the patient, evaluated laboratory and imaging results, formulated the assessment and plan and placed orders.  Maria DELENA Bathe, MD [facility] Diplomate ABMS Pulmonary Critical Care Medicine and Sleep Medicine

## 2024-05-06 ENCOUNTER — Ambulatory Visit

## 2024-05-24 NOTE — Progress Notes (Unsigned)
 [facility] [address] [place], [place] [address]  Pulmonary Sleep Medicine   Office Visit Note  Patient Name: Maria Jacobson DOB: 03/18/1976 MRN [id number]    Chief Complaint: Obstructive Sleep Apnea visit  Brief History:  Maria Jacobson is seen today for a follow up visit for APAP@ 4-20 cmH2O. The patient has a 7 month history of sleep apnea but has not been able to use her PAP since set up. Patient reports history of spinal stenosis that has caused pain and complications in allowing the patient to remember to use her PAP at night. Patient is interested in restarting therapy. Sleep quality is poor. This is noted most nights. The patient's bed partner denies snoring, gasping and choking at night. The patient relates the following symptoms: some fatigue, trouble concentrating and brain fog are also present. The patient goes to sleep at 1030 pm and wakes up at 0500 am. Patient reports waking up at least one times in between and has some trouble returning to sleep. Sleep quality is worse when outside home environment.  Patient has noted some movement of her legs at night that would disrupt her sleep.  The patient reports sleep talking as unusual behavior during the night.  The patient reports PTSD, major depression, anxiety, OCD, Bipolar disorder and ADHD as a history of psychiatric problems. The Epworth Sleepiness Score is 4 out of 24.  The patient reports cardiovascular risk factors as following: none.   ROS General: (-) fever, (-) chills, (-) night sweat Nose and Sinuses: (-) nasal stuffiness or itchiness, (-) postnasal drip, (-) nosebleeds, (-) sinus trouble. Mouth and Throat: (-) sore throat, (-) hoarseness. Neck: (-) swollen glands, (-) enlarged thyroid, (-) neck pain. Respiratory: - cough, - shortness of breath, - wheezing. Neurologic: - numbness, - tingling. Psychiatric: + anxiety, + depression   Current Medication: Outpatient Encounter Medications as of 05/27/2024   Medication Sig Note   acetaminophen (TYLENOL) 500 MG tablet Take 1,000 mg by mouth every 4 (four) hours as needed for headache.    albuterol  (VENTOLIN  HFA) 108 (90 Base) MCG/ACT inhaler INHALE TWO PUFFS BY MOUTH EVERY 4 HOURS AS NEEDED FOR WHEEZING OR FOR SHORTNESS OF BREATH OR FOR COUGH    ALPRAZolam (XANAX) 1 MG tablet Take 1 mg by mouth at bedtime as needed for anxiety.    amphetamine-dextroamphetamine (ADDERALL) 20 MG tablet Take by mouth.    amphetamine-dextroamphetamine (ADDERALL) 30 MG tablet Take 30 mg by mouth daily. 12/04/2019: QAM   ARIPiprazole (ABILIFY) 15 MG tablet Take 15 mg by mouth daily.    clonazePAM (KLONOPIN) 0.5 MG tablet Take 0.5 mg by mouth 5 (five) times daily.    fexofenadine (ALLEGRA) 180 MG tablet Take 180 mg by mouth daily as needed for allergies or rhinitis.    furosemide  (LASIX ) 20 MG tablet TAKE 1 TABLET BY MOUTH EVERY DAY FOR SWELLING, MAY INCREASE TO 2 TABLETS IF NEEDED FOR MAX 7 DAYS PER FLARE    gabapentin  (NEURONTIN ) 300 MG capsule Take by mouth.    lamoTRIgine (LAMICTAL) 100 MG tablet Take 200 mg by mouth 2 (two) times daily. Taking 200 mg (2 tablets) each morning and 150 mg (1.5 tablets) each evening    meloxicam  (MOBIC ) 15 MG tablet Take 1 tablet (15 mg total) by mouth daily.    omeprazole  (PRILOSEC) 20 MG capsule Take 1 capsule (20 mg total) by mouth daily as needed. OFFICE VISIT NEEDED FOR ADDITIONAL REFILLS    tiZANidine (ZANAFLEX) 2 MG tablet TAKE ONE TABLET BY MOUTH  EVERY 8 HOURS AS NEEDED FOR MUSCLE PAIN    No facility-administered encounter medications on file as of 05/27/2024.    Surgical History: Past Surgical History:  Procedure Laterality Date   BREAST BIOPSY Right    DILATION AND CURETTAGE OF UTERUS      Medical History: Past Medical History:  Diagnosis Date   Anxiety    pt is seen by psych   Bipolar 1 disorder (HCC)    Bipolar affective disorder, depressed, severe (HCC)    Depression     Family History: Non contributory to the  present illness  Social History: Social History   Socioeconomic History   Marital status: Significant Other    Spouse name: Not on file   Number of children: Not on file   Years of education: Not on file   Highest education level: Not on file  Occupational History   Occupation: [employer]    Comment: Travels to [employer] often  Tobacco Use   Smoking status: Never   Smokeless tobacco: Never  Vaping Use   Vaping status: Never Used  Substance and Sexual Activity   Alcohol use: Not Currently    Comment: rarely   Drug use: No   Sexual activity: Yes    Birth control/protection: I.U.D.  Other Topics Concern   Not on file  Social History Narrative   Not on file   Social Drivers of Health   Financial Resource Strain: Medium Risk (03/14/2024)   Received from [facility]   Overall Financial Resource Strain (CARDIA)    How hard is it for you to pay for the very basics like food, housing, medical care, and heating?: Somewhat hard  Food Insecurity: Food Insecurity Present (03/14/2024)   Received from [facility]   Hunger Vital Sign    Within the past 12 months, you worried that your food would run out before you got the money to buy more.: Sometimes true    Within the past 12 months, the food you bought just didn't last and you didn't have money to get more.: Patient declined  Transportation Needs: No Transportation Needs (03/14/2024)   Received from [facility]   PRAPARE - Transportation    Lack of Transportation (Medical): No    Lack of Transportation (Non-Medical): No  Physical Activity: Inactive (03/18/2021)   Exercise Vital Sign    Days of Exercise per Week: 0 days    Minutes of Exercise per Session: 0 min  Stress: Stress Concern Present (03/18/2021)   [employer] of Occupational Health - Occupational Stress Questionnaire    Feeling of Stress : Rather much  Social Connections: Moderately Isolated (03/18/2021)   Social Connection and Isolation  Panel    Frequency of Communication with Friends and Family: More than three times a week    Frequency of Social Gatherings with Friends and Family: More than three times a week    Attends Religious Services: Never    Database Administrator or Organizations: No    Attends Banker Meetings: Never    Marital Status: Living with partner  Intimate Partner Violence: Not At Risk (03/18/2021)   Humiliation, Afraid, Rape, and Kick questionnaire    Fear of Current or Ex-Partner: No    Emotionally Abused: No    Physically Abused: No    Sexually Abused: No    Vital Signs: Blood pressure (!) 132/93, pulse 66, resp. rate 16, height 5' 8 (1.727 m), weight (!) 302 lb (137 kg), SpO2  98%. Body mass index is 45.92 kg/m.    Examination: General Appearance: The patient is well-developed, well-nourished, and in no distress. Neck Circumference: 42 cm Skin: Gross inspection of skin unremarkable. Head: normocephalic, no gross deformities. Eyes: no gross deformities noted. ENT: ears appear grossly normal Neurologic: Alert and oriented. No involuntary movements.  STOP BANG RISK ASSESSMENT S (snore) Have you been told that you snore?     NO   T (tired) Are you often tired, fatigued, or sleepy during the day?   YES  O (obstruction) Do you stop breathing, choke, or gasp during sleep? NO   P (pressure) Do you have or are you being treated for high blood pressure? NO   B (BMI) Is your body index greater than 35 kg/m? YES   A (age) Are you 74 years old or older? YES   N (neck) Do you have a neck circumference greater than 16 inches?   YES   G (gender) Are you a female? NO   TOTAL STOP/BANG "YES" ANSWERS 4       A STOP-Bang score of 2 or less is considered low risk, and a score of 5 or more is high risk for having either moderate or severe OSA. For people who score 3 or 4, doctors may need to perform further assessment to determine how likely they are to have OSA.         EPWORTH  SLEEPINESS SCALE:  Scale:  (0)= no chance of dozing; (1)= slight chance of dozing; (2)= moderate chance of dozing; (3)= high chance of dozing  Chance  Situtation    Sitting and reading: 0    Watching TV: 1    Sitting Inactive in public: 0    As a passenger in car: 1      Lying down to rest: 2    Sitting and talking: 0    Sitting quielty after lunch: 0    In a car, stopped in traffic: 0   TOTAL SCORE:   4 out of 24    SLEEP STUDIES:  HST (09/2023) AHI 12/hr, min SpO2 80%   CPAP COMPLIANCE DATA:  Date Range: Pt has not used her PAP. No usage data to download.  Average Daily Use: - hours  Median Use: -  Compliance for > 4 Hours: - days  AHI: - respiratory events per hour  Days Used: -  Mask Leak: -  95th Percentile Pressure: -         LABS: No results found for this or any previous visit (from the past 2160 hours).  Radiology: MS DIGITAL SCREENING TOMO BILATERAL Result Date: 12/31/2019 CLINICAL DATA:  Screening. EXAM: DIGITAL SCREENING BILATERAL MAMMOGRAM WITH TOMO AND CAD COMPARISON:  Previous exam(s). ACR Breast Density Category b: There are scattered areas of fibroglandular density. FINDINGS: There are no findings suspicious for malignancy. Images were processed with CAD. IMPRESSION: No mammographic evidence of malignancy. A result letter of this screening mammogram will be mailed directly to the patient. RECOMMENDATION: Screening mammogram in one year. (Code:SM-B-01Y) BI-RADS CATEGORY  1: Negative. Electronically Signed   By: Alm Pouch III M.D   On: 12/31/2019 16:50    No results found.  No results found.    Assessment and Plan: Patient Active Problem List   Diagnosis Date Noted   OSA (obstructive sleep apnea) 05/27/2024   CPAP use counseling 05/27/2024   RLS (restless legs syndrome) 05/27/2024   Morbid obesity with BMI of 40.0-44.9, adult (HCC) 05/27/2024   Abnormal  urine 05/01/2020   Abnormal urine odor 05/01/2020   Fatigue  05/01/2020   GERD (gastroesophageal reflux disease) 09/25/2019   Epidermal cyst of neck 06/13/2017   Atypical mole 10/05/2016   Bilateral lower extremity edema 06/15/2016   Pain and swelling of left lower leg 06/15/2016   Pain in joint, multiple sites 06/15/2016   Family history of rheumatoid arthritis 06/15/2016   Chronic bilateral low back pain with right-sided sciatica 05/24/2016   Left medial knee pain 05/23/2016   Anxiety 05/23/2016   Bipolar affective disorder, depressed, severe (HCC) 05/23/2016   Morbid obesity with BMI of 50.0-59.9, adult (HCC) 05/23/2016   Hyperlipidemia 05/23/2016    1. OSA (obstructive sleep apnea) (Primary) The patient did not use her cpap. She will need another psg to document medical necessity to restart therapy. She has snoring, brain fog, daytime sleepiness, fatigue and trouble concentrating.  Recommend: PSG, then resume therapy. F/u after setup  2. CPAP use counseling CPAP Counseling: had a lengthy discussion with the patient regarding the importance of PAP therapy in management of the sleep apnea. Patient appears to understand the risk factor reduction and also understands the risks associated with untreated sleep apnea. Patient will try to make a good faith effort to remain compliant with therapy. Also instructed the patient on proper cleaning of the device including the water must be changed daily if possible and use of distilled water is preferred. Patient understands that the machine should be regularly cleaned with appropriate recommended cleaning solutions that do not damage the PAP machine for example given white vinegar and water rinses. Other methods such as ozone treatment may not be as good as these simple methods to achieve cleaning.   3. RLS (restless legs syndrome) Will follow up after study.   4. Morbid obesity with BMI of 40.0-44.9, adult (HCC) Obesity Counseling: Had a lengthy discussion regarding patients BMI and weight issues. Patient  was instructed on portion control as well as increased activity. Also discussed caloric restrictions with trying to maintain intake less than 2000 Kcal. Discussions were made in accordance with the 5As of weight management. Simple actions such as not eating late and if able to, taking a walk is suggested.    General Counseling: I have discussed the findings of the evaluation and examination with Maria Jacobson.  I have also discussed any further diagnostic evaluation thatmay be needed or ordered today. Maria Jacobson verbalizes understanding of the findings of todays visit. We also reviewed her medications today and discussed drug interactions and side effects including but not limited excessive drowsiness and altered mental states. We also discussed that there is always a risk not just to her but also people around her. she has been encouraged to call the office with any questions or concerns that should arise related to todays visit.  No orders of the defined types were placed in this encounter.       I have personally obtained a history, examined the patient, evaluated laboratory and imaging results, formulated the assessment and plan and placed orders. This patient was seen today by Lauraine Lay, PA-C in collaboration with Dr. Elfreda Bathe.   Elfreda DELENA Bathe, MD [facility] [place] Diplomate ABMS Pulmonary Critical Care Medicine and Sleep Medicine

## 2024-05-27 ENCOUNTER — Ambulatory Visit (INDEPENDENT_AMBULATORY_CARE_PROVIDER_SITE_OTHER): Admitting: Internal Medicine

## 2024-05-27 VITALS — BP 132/93 | HR 66 | Resp 16 | Ht 68.0 in | Wt 302.0 lb

## 2024-05-27 DIAGNOSIS — Z7189 Other specified counseling: Secondary | ICD-10-CM | POA: Diagnosis not present

## 2024-05-27 DIAGNOSIS — G4733 Obstructive sleep apnea (adult) (pediatric): Secondary | ICD-10-CM | POA: Insufficient documentation

## 2024-05-27 DIAGNOSIS — G2581 Restless legs syndrome: Secondary | ICD-10-CM | POA: Diagnosis not present

## 2024-05-27 DIAGNOSIS — Z6841 Body Mass Index (BMI) 40.0 and over, adult: Secondary | ICD-10-CM

## 2024-05-27 NOTE — Patient Instructions (Signed)
 Living With Sleep Apnea Sleep apnea is a condition that affects your breathing while you're sleeping. Your tongue or the tissue in your throat may block the flow of air while you sleep. You may have shallow breathing or stop breathing for short periods of time. The breaks in breathing interrupt the deep sleep that you need to feel rested. Even if you don't wake up from the gaps in breathing, you may feel tired during the day. People with sleep apnea may snore loudly. You may have a headache in the morning and feel anxious or depressed. How can sleep apnea affect me? Sleep apnea increases your chances of being very tired during the day. This is called daytime fatigue. Sleep apnea can also increase your risk of: Heart attack. Stroke. Obesity. Type 2 diabetes. Heart failure. Irregular heartbeat. High blood pressure. If you are very tired during the day, you may be more likely to: Not do well in school or at work. Fall asleep while driving. Have trouble paying attention. Develop depression or anxiety. Have problems having sex. This is called sexual dysfunction. What actions can I take to manage sleep apnea? Sleep apnea treatment  If you were given a device to open your airway while you sleep, use it only as told by your health care provider. You may be given: An oral appliance. This is a mouthpiece that shifts your lower jaw forward. A continuous positive airway pressure (CPAP) device. This blows air through a mask. A nasal expiratory positive airway pressure (EPAP) device. This has valves that you put into each nostril. A bi-level positive airway pressure (BIPAP) device. This blows air through a mask when you breathe in and breathe out. You may need surgery if other treatments don't work for you. Sleep habits Go to sleep and wake up at the same time every day. This helps set your internal clock for sleeping. If you stay up later than usual on weekends, try to get up in the morning within 2  hours of the time you usually wake up. Try to get at least 7-9 hours of sleep each night. Stop using a computer, tablet, and mobile phone a few hours before bedtime. Do not take long naps during the day. If you nap, limit it to 30 minutes. Have a relaxing bedtime routine. Reading or listening to music may relax you and help you sleep. Use your bedroom only for sleep. Keep your television and computer out of your bedroom. Keep your bedroom cool, dark, and quiet. Use a supportive mattress and pillows. Follow your provider's instructions for other changes to sleep habits. Nutrition Do not eat big meals in the evening. Do not have caffeine in the later part of the day. The effects of caffeine can last for more than 5 hours. Follow your provider's instructions for any changes to what you eat and drink. Lifestyle Do not drink alcohol before bedtime. Alcohol can cause you to fall asleep at first, but then it can cause you to wake up in the middle of the night and have trouble getting back to sleep. Do not smoke, vape, or use nicotine or tobacco. Medicines Take over-the-counter and prescription medicines only as told by your provider. Do not use over-the-counter sleep medicine. You may become dependent on this medicine, and it can make sleep apnea worse. Do not take medicines, such as sedatives and narcotics, unless told to by your provider. Activity Exercise on most days, but avoid exercising in the evening. Exercising near bedtime can interfere with sleeping.  If possible, spend time outside every day. Natural light helps with your internal clock. General information Lose weight if you need to. Stay at a healthy weight. If you are having surgery, make sure to tell your provider that you have sleep apnea. You may need to bring your device with you. Keep all follow-up visits. Your provider will want to check on your condition. Where to find more information [employer]  [employer]: [employer] This information is not intended to replace advice given to you by your health care provider. Make sure you discuss any questions you have with your health care provider. Document Revised: 11/09/2022 Document Reviewed: 11/09/2022 [employer] Patient Education  2024 [employer].
